# Patient Record
Sex: Male | Born: 1955 | Race: White | Hispanic: No | Marital: Married | Smoking: Never smoker
Health system: Southern US, Community
[De-identification: ages and names within clinical notes are randomized; demographics above are authoritative.]

## PROBLEM LIST (undated history)

## (undated) DIAGNOSIS — H9319 Tinnitus, unspecified ear: Secondary | ICD-10-CM

## (undated) DIAGNOSIS — Z9889 Other specified postprocedural states: Secondary | ICD-10-CM

## (undated) DIAGNOSIS — Z952 Presence of prosthetic heart valve: Secondary | ICD-10-CM

## (undated) DIAGNOSIS — K6289 Other specified diseases of anus and rectum: Secondary | ICD-10-CM

## (undated) DIAGNOSIS — Z7901 Long term (current) use of anticoagulants: Secondary | ICD-10-CM

## (undated) DIAGNOSIS — I38 Endocarditis, valve unspecified: Secondary | ICD-10-CM

## (undated) DIAGNOSIS — R519 Headache, unspecified: Secondary | ICD-10-CM

## (undated) DIAGNOSIS — M7120 Synovial cyst of popliteal space [Baker], unspecified knee: Secondary | ICD-10-CM

## (undated) DIAGNOSIS — Z8379 Family history of other diseases of the digestive system: Secondary | ICD-10-CM

## (undated) DIAGNOSIS — R3912 Poor urinary stream: Secondary | ICD-10-CM

## (undated) DIAGNOSIS — K649 Unspecified hemorrhoids: Secondary | ICD-10-CM

## (undated) DIAGNOSIS — M1990 Unspecified osteoarthritis, unspecified site: Secondary | ICD-10-CM

## (undated) DIAGNOSIS — R350 Frequency of micturition: Secondary | ICD-10-CM

## (undated) DIAGNOSIS — R011 Cardiac murmur, unspecified: Secondary | ICD-10-CM

## (undated) DIAGNOSIS — C801 Malignant (primary) neoplasm, unspecified: Secondary | ICD-10-CM

## (undated) DIAGNOSIS — I341 Nonrheumatic mitral (valve) prolapse: Secondary | ICD-10-CM

## (undated) DIAGNOSIS — E785 Hyperlipidemia, unspecified: Secondary | ICD-10-CM

## (undated) DIAGNOSIS — R112 Nausea with vomiting, unspecified: Secondary | ICD-10-CM

## (undated) DIAGNOSIS — T7840XA Allergy, unspecified, initial encounter: Secondary | ICD-10-CM

## (undated) DIAGNOSIS — R51 Headache: Secondary | ICD-10-CM

## (undated) DIAGNOSIS — N3020 Other chronic cystitis without hematuria: Secondary | ICD-10-CM

## (undated) DIAGNOSIS — Z87442 Personal history of urinary calculi: Secondary | ICD-10-CM

## (undated) DIAGNOSIS — D689 Coagulation defect, unspecified: Secondary | ICD-10-CM

## (undated) DIAGNOSIS — J189 Pneumonia, unspecified organism: Secondary | ICD-10-CM

## (undated) DIAGNOSIS — N400 Enlarged prostate without lower urinary tract symptoms: Secondary | ICD-10-CM

## (undated) HISTORY — PX: CARDIAC VALVE REPLACEMENT: SHX585

## (undated) HISTORY — DX: Coagulation defect, unspecified: D68.9

## (undated) HISTORY — PX: MITRAL VALVE REPAIR: SHX2039

## (undated) HISTORY — PX: HERNIA REPAIR: SHX51

## (undated) HISTORY — DX: Nonrheumatic mitral (valve) prolapse: I34.1

## (undated) HISTORY — DX: Unspecified osteoarthritis, unspecified site: M19.90

## (undated) HISTORY — PX: TONSILLECTOMY: SUR1361

## (undated) HISTORY — PX: MITRAL VALVE REPLACEMENT: SHX147

## (undated) HISTORY — DX: Endocarditis, valve unspecified: I38

## (undated) HISTORY — PX: KNEE SURGERY: SHX244

## (undated) HISTORY — DX: Cardiac murmur, unspecified: R01.1

## (undated) HISTORY — DX: Hyperlipidemia, unspecified: E78.5

## (undated) HISTORY — PX: JOINT REPLACEMENT: SHX530

## (undated) HISTORY — DX: Long term (current) use of anticoagulants: Z79.01

## (undated) HISTORY — PX: OTHER SURGICAL HISTORY: SHX169

## (undated) HISTORY — DX: Presence of prosthetic heart valve: Z95.2

## (undated) HISTORY — DX: Allergy, unspecified, initial encounter: T78.40XA

---
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
## 1998-09-01 ENCOUNTER — Ambulatory Visit (HOSPITAL_COMMUNITY): Admission: RE | Admit: 1998-09-01 | Discharge: 1998-09-01 | Payer: Self-pay | Admitting: Cardiovascular Disease

## 1998-10-24 ENCOUNTER — Emergency Department (HOSPITAL_COMMUNITY): Admission: EM | Admit: 1998-10-24 | Discharge: 1998-10-24 | Payer: Self-pay | Admitting: Emergency Medicine

## 1998-10-26 ENCOUNTER — Ambulatory Visit: Admission: RE | Admit: 1998-10-26 | Discharge: 1998-10-26 | Payer: Self-pay | Admitting: Cardiovascular Disease

## 1999-01-03 ENCOUNTER — Ambulatory Visit (HOSPITAL_COMMUNITY): Admission: RE | Admit: 1999-01-03 | Discharge: 1999-01-03 | Payer: Self-pay | Admitting: Cardiovascular Disease

## 1999-01-24 ENCOUNTER — Encounter (HOSPITAL_COMMUNITY): Admission: RE | Admit: 1999-01-24 | Discharge: 1999-04-24 | Payer: Self-pay | Admitting: Cardiovascular Disease

## 2000-05-30 ENCOUNTER — Ambulatory Visit (HOSPITAL_BASED_OUTPATIENT_CLINIC_OR_DEPARTMENT_OTHER): Admission: RE | Admit: 2000-05-30 | Discharge: 2000-05-30 | Payer: Self-pay | Admitting: Internal Medicine

## 2002-12-14 ENCOUNTER — Encounter: Payer: Self-pay | Admitting: Internal Medicine

## 2002-12-14 ENCOUNTER — Encounter: Admission: RE | Admit: 2002-12-14 | Discharge: 2002-12-14 | Payer: Self-pay | Admitting: Internal Medicine

## 2005-09-06 ENCOUNTER — Encounter: Admission: RE | Admit: 2005-09-06 | Discharge: 2005-09-06 | Payer: Self-pay | Admitting: Gastroenterology

## 2005-10-08 ENCOUNTER — Emergency Department (HOSPITAL_COMMUNITY): Admission: EM | Admit: 2005-10-08 | Discharge: 2005-10-08 | Payer: Self-pay | Admitting: Family Medicine

## 2009-02-07 ENCOUNTER — Emergency Department (HOSPITAL_COMMUNITY): Admission: EM | Admit: 2009-02-07 | Discharge: 2009-02-08 | Payer: Self-pay | Admitting: Emergency Medicine

## 2009-12-13 ENCOUNTER — Ambulatory Visit: Payer: Self-pay | Admitting: Cardiovascular Disease

## 2010-05-22 LAB — CBC
HCT: 46.5 % (ref 39.0–52.0)
Hemoglobin: 16.1 g/dL (ref 13.0–17.0)
MCHC: 34.5 g/dL (ref 30.0–36.0)
MCV: 91.3 fL (ref 78.0–100.0)
Platelets: 128 10*3/uL — ABNORMAL LOW (ref 150–400)
RBC: 5.1 MIL/uL (ref 4.22–5.81)
RDW: 13.2 % (ref 11.5–15.5)
WBC: 4.5 10*3/uL (ref 4.0–10.5)

## 2010-05-22 LAB — DIFFERENTIAL
Basophils Absolute: 0 10*3/uL (ref 0.0–0.1)
Basophils Relative: 0 % (ref 0–1)
Eosinophils Absolute: 0.1 10*3/uL (ref 0.0–0.7)
Eosinophils Relative: 3 % (ref 0–5)
Lymphocytes Relative: 26 % (ref 12–46)
Lymphs Abs: 1.2 10*3/uL (ref 0.7–4.0)
Monocytes Absolute: 0.6 10*3/uL (ref 0.1–1.0)
Monocytes Relative: 14 % — ABNORMAL HIGH (ref 3–12)
Neutro Abs: 2.5 10*3/uL (ref 1.7–7.7)
Neutrophils Relative %: 56 % (ref 43–77)

## 2010-05-22 LAB — PROTIME-INR
INR: 2.39 — ABNORMAL HIGH (ref 0.00–1.49)
Prothrombin Time: 25.9 seconds — ABNORMAL HIGH (ref 11.6–15.2)

## 2010-06-23 ENCOUNTER — Encounter: Payer: Self-pay | Admitting: Cardiovascular Disease

## 2010-06-23 DIAGNOSIS — Z7901 Long term (current) use of anticoagulants: Secondary | ICD-10-CM | POA: Insufficient documentation

## 2010-06-23 DIAGNOSIS — Z952 Presence of prosthetic heart valve: Secondary | ICD-10-CM | POA: Insufficient documentation

## 2010-06-23 DIAGNOSIS — E785 Hyperlipidemia, unspecified: Secondary | ICD-10-CM | POA: Insufficient documentation

## 2010-06-23 DIAGNOSIS — I38 Endocarditis, valve unspecified: Secondary | ICD-10-CM | POA: Insufficient documentation

## 2010-06-23 DIAGNOSIS — I341 Nonrheumatic mitral (valve) prolapse: Secondary | ICD-10-CM | POA: Insufficient documentation

## 2010-06-29 ENCOUNTER — Ambulatory Visit: Payer: Self-pay | Admitting: Cardiovascular Disease

## 2010-07-07 NOTE — Op Note (Signed)
[place]  Patient:    Zachary Calderon, Zachary Calderon                        MRN: [id number] Proc. Date: 05/30/00 Adm. Date:  [id number] Attending:  Reesa Chew                           Operative Report  PREOPERATIVE DIAGNOSIS:  Medial meniscus tear, right knee.  POSTOPERATIVE DIAGNOSIS:  Medial and lateral meniscus tear, right knee.  OPERATION: 1. Right knee examination under anesthesia. 2. Arthroscopy. 3. Partial medial and lateral meniscectomy.  SURGEON:  Loreta Ave, M.D.  ASSISTANT:  Arlys John D. Petrarca, P.A.-C.  ANESTHESIA:  Local with IV sedation.  SPECIMENS:  None.  CULTURES:  None.  COMPLICATIONS:  None.  DRESSING:  Soft compressive.  DESCRIPTION OF PROCEDURE:  The patient was brought to the operating room and placed on the operating table in supine position.  After adequate anesthesia had been obtained, right knee examined.  Full motion, good stability, positive medial McMurray.  Tourniquet and leg holder applied, leg prepped and draped in the usual sterile fashion.  Three portals created, one superolateral, one each medial and lateral parapatellar.  Inflow catheter introduced.  Knee extended. Arthroscope introduced.  Knee inspected.  Only some mild grade I changes in all three compartments, nothing marked.  Radial tear in mid portion lateral meniscus.  ______ tapered smoothly.  Extensive complex tearing posterior half medial meniscus which was removed, tapered into remaining meniscus, salvaging the anterior half.  Good patellofemoral tracking.  Cruciate ligaments intact. Entire knee examined.  No significant findings appreciated.  Instruments and fluid removed.  Portals of knee injected with Marcaine.  Portals closed with 4-0 nylon.  Sterile compressive dressing applied.  Anesthesia reversed, brought to the recovery room.  Tolerated surgery well with no complications. DD:  05/30/00 TD:  05/30/00 Job: [id number]

## 2010-08-14 ENCOUNTER — Ambulatory Visit (INDEPENDENT_AMBULATORY_CARE_PROVIDER_SITE_OTHER): Payer: BC Managed Care – PPO | Admitting: Cardiovascular Disease

## 2010-08-14 ENCOUNTER — Encounter: Payer: Self-pay | Admitting: Cardiovascular Disease

## 2010-08-14 VITALS — BP 124/88 | HR 86 | Ht 72.0 in | Wt 190.2 lb

## 2010-08-14 DIAGNOSIS — Z952 Presence of prosthetic heart valve: Secondary | ICD-10-CM

## 2010-08-14 DIAGNOSIS — Z954 Presence of other heart-valve replacement: Secondary | ICD-10-CM

## 2010-08-14 NOTE — Progress Notes (Signed)
Zachary Calderon Date of Birth  [birth year] [facility] / [facility] [address]     [address] [place], [place]  [address] [phone]  Fax  [phone]  History of Present Illness:  No cardiac complaints.  No excesive bleeding.  Cycling 5 times a week on his spin bike  Current Outpatient Prescriptions on File Prior to Visit  Medication Sig Dispense Refill  . atorvastatin (LIPITOR) 40 MG tablet Take 40 mg by mouth daily.        . Cholecalciferol (VITAMIN D) 1000 UNITS capsule Take 1,000 Units by mouth daily.        . Coenzyme Q10 (COQ10 PO) Take by mouth daily.        . fish oil-omega-3 fatty acids 1000 MG capsule Take by mouth daily. 6 TABS DAILY       . Multiple Vitamin (MULTIVITAMIN) tablet Take 1 tablet by mouth daily. OCCASIONALLY       . Naproxen Sodium (ALEVE PO) Take by mouth as needed.        . vitamin E 400 UNIT capsule Take 400 Units by mouth daily.        Marland Kitchen warfarin (COUMADIN) 5 MG tablet Take 5 mg by mouth daily. AS DIRECTED        . DISCONTD: Saw Palmetto, Serenoa repens, (SAW PALMETTO PO) Take by mouth daily.        Marland Kitchen DISCONTD: ZOLMitriptan (ZOMIG PO) Take by mouth as needed.          Allergies  Allergen Reactions  . Cephalosporins   . Gentamicin   . Vancomycin     Past Medical History  Diagnosis Date  . S/P MVR (mitral valve replacement)   . MVP (mitral valve prolapse)   . Endocarditis   . Hyperlipidemia   . Chronic anticoagulation     Past Surgical History  Procedure Date  . Appendectomy     History  Smoking status  . Never Smoker   Smokeless tobacco  . Not on file    History  Alcohol Use No    Family History  Problem Relation Age of Onset  . Hypertension Mother     Reviw of Systems:  Reviewed in the HPI.  All other systems are negative.  Physical Exam: BP 124/88  Pulse 86  Ht 6' (1.829 m)  Wt 190 lb 3.2 oz (86.274 kg)  BMI 25.80 kg/m2 The patient is alert and oriented x 3.  The mood and affect are  normal.   Skin: warm and dry.  Color is normal.    HEENT:   the sclera are nonicteric.  The mucous membranes are moist.  The carotids are 2+ without bruits.  There is no thyromegaly.  There is no JVD.    Lungs: clear.  The chest wall is non tender.    Heart: regular rate with a mechanical S1 and S2.  There are no murmurs, gallops, or rubs. The PMI is not displaced.     Abdomin: good bowel sounds.  There is no guarding or rebound.  There is no hepatosplenomegaly or tenderness.  There are no masses.   Extremities:  no clubbing, cyanosis, or edema.  The legs are without rashes.  The distal pulses are intact.   Neuro:  Cranial nerves II - XII are intact.  Motor and sensory functions are intact.    The gait is normal.  Assessment / Plan:

## 2010-08-14 NOTE — Assessment & Plan Note (Signed)
Zachary Calderon is doing very well from a cardiac standpoint. His INR levels have been therapeutic. We'll continue with his same dose of Coumadin. His last echocardiogram was in 2010. We'll wait another year to get another echocardiogram. He remains asymptomatic. We have written him a standing order to have his INR checked at [facility].

## 2010-08-21 ENCOUNTER — Telehealth: Payer: Self-pay | Admitting: *Deleted

## 2010-08-21 NOTE — Telephone Encounter (Signed)
He is having his labs for pt/inr done elsewhere, he is to get a fax number for me, left msg to call back with info.

## 2010-08-28 ENCOUNTER — Telehealth: Payer: Self-pay | Admitting: *Deleted

## 2010-08-28 NOTE — Telephone Encounter (Signed)
Pt would like name and number for clinical trials(no specific trial) you spoke of on last visit.  Script was faxed to [facility] for inr check, see order scan. P=[phone]

## 2010-08-29 NOTE — Telephone Encounter (Signed)
[employer] is a company that does trials.  dont know the number .  He may want to contact them .

## 2010-08-30 NOTE — Telephone Encounter (Signed)
Pt called w info

## 2010-12-15 ENCOUNTER — Telehealth: Payer: Self-pay | Admitting: *Deleted

## 2010-12-15 ENCOUNTER — Other Ambulatory Visit: Payer: Self-pay | Admitting: *Deleted

## 2010-12-15 DIAGNOSIS — I38 Endocarditis, valve unspecified: Secondary | ICD-10-CM

## 2010-12-15 DIAGNOSIS — Z952 Presence of prosthetic heart valve: Secondary | ICD-10-CM

## 2010-12-15 MED ORDER — WARFARIN SODIUM 5 MG PO TABS: 5.0000 mg | ORAL_TABLET | Freq: Every day | ORAL | Status: DC

## 2010-12-15 NOTE — Telephone Encounter (Signed)
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 Pt to go to [facility] or other social service support but has to be on coumadin and have inr checks on monthly basis. Pt told to have inr in one week either here in office or [facility] where original script was sent.  1) Coumadin was ordered, pt is to protect mechanical valve. 2) Pt Taking 7.5 mg x 5 and 10 mg x 2.  3) Pt will be contacted 11/2 if no inr in office. Alfonso Ramus RN

## 2010-12-15 NOTE — Telephone Encounter (Signed)
Refill for coumadin denied to pt, we dont have any inr's pt was to go to [facility] and make sure we received labs. Pt to call back.

## 2010-12-15 NOTE — Telephone Encounter (Signed)
Talked to Zachary Calderon,  He lost his job in June and has been without insurance.  He takes brand name Coumadin because his INR levels were quite variable on generic warfarin.   We agreed to have his INR levels drawn at [facility] and faxed to [place] but we have not received any INR levels.  When I talked to his he told me that he has not taken his coumadin since Monday.  I reminded him that this was extremely dangerous and that he is at very high risk of a stroke since he has not taken his meds.  Will refill his coumadin. I told him that he needed to have his INR levels checked in 5-7 days - we will be happy to fax an order to the lab of his choice.  See nursing notes

## 2010-12-21 ENCOUNTER — Encounter: Payer: Self-pay | Admitting: Cardiovascular Disease

## 2010-12-21 LAB — PROTIME-INR: INR: 2.3 — AB (ref <=1.1)

## 2010-12-25 NOTE — Progress Notes (Signed)
Forwarded to coumadin clinic, sally putt and tiffany muse

## 2010-12-26 ENCOUNTER — Ambulatory Visit (INDEPENDENT_AMBULATORY_CARE_PROVIDER_SITE_OTHER): Payer: Self-pay | Admitting: Cardiology

## 2010-12-26 DIAGNOSIS — Z952 Presence of prosthetic heart valve: Secondary | ICD-10-CM

## 2010-12-26 DIAGNOSIS — Z7901 Long term (current) use of anticoagulants: Secondary | ICD-10-CM

## 2010-12-26 DIAGNOSIS — R0989 Other specified symptoms and signs involving the circulatory and respiratory systems: Secondary | ICD-10-CM

## 2010-12-26 DIAGNOSIS — I059 Rheumatic mitral valve disease, unspecified: Secondary | ICD-10-CM

## 2011-01-08 ENCOUNTER — Telehealth: Payer: Self-pay | Admitting: Pharmacist

## 2011-01-08 NOTE — Telephone Encounter (Signed)
Called patient to follow-up on PT/INR that was due on 11/15.  Pt stated he was unable to get to the lab last week.  He works in [place] and says the only day he can get his lab work done is on Thursday.  Since this Thursday is a holiday, he is planning on going to have labwork done on 11/29.  Pt is aware this is 2 weeks after the requested date.  Also reinforced statement that we would only be able to follow INR through lab draws is if patient is consistent and is not delinquent.   He says he will try to find a lab in [place].

## 2011-01-08 NOTE — Telephone Encounter (Signed)
I agree. Thank you.

## 2011-01-10 ENCOUNTER — Ambulatory Visit (INDEPENDENT_AMBULATORY_CARE_PROVIDER_SITE_OTHER): Payer: Self-pay | Admitting: Cardiovascular Disease

## 2011-01-10 DIAGNOSIS — I059 Rheumatic mitral valve disease, unspecified: Secondary | ICD-10-CM

## 2011-01-10 DIAGNOSIS — Z952 Presence of prosthetic heart valve: Secondary | ICD-10-CM

## 2011-01-10 DIAGNOSIS — Z7901 Long term (current) use of anticoagulants: Secondary | ICD-10-CM

## 2011-01-10 DIAGNOSIS — R0989 Other specified symptoms and signs involving the circulatory and respiratory systems: Secondary | ICD-10-CM

## 2011-01-10 LAB — PROTIME-INR: INR: 4.3 — AB (ref 0.9–1.1)

## 2011-01-25 ENCOUNTER — Encounter: Payer: Self-pay | Admitting: Cardiovascular Disease

## 2011-01-25 LAB — PROTIME-INR: INR: 5.9 — AB (ref <=1.1)

## 2011-01-26 ENCOUNTER — Ambulatory Visit (INDEPENDENT_AMBULATORY_CARE_PROVIDER_SITE_OTHER): Payer: Self-pay | Admitting: Cardiology

## 2011-01-26 DIAGNOSIS — Z7901 Long term (current) use of anticoagulants: Secondary | ICD-10-CM

## 2011-01-26 DIAGNOSIS — I059 Rheumatic mitral valve disease, unspecified: Secondary | ICD-10-CM

## 2011-01-26 DIAGNOSIS — Z952 Presence of prosthetic heart valve: Secondary | ICD-10-CM

## 2011-01-26 DIAGNOSIS — R0989 Other specified symptoms and signs involving the circulatory and respiratory systems: Secondary | ICD-10-CM

## 2011-01-28 ENCOUNTER — Ambulatory Visit: Payer: Self-pay

## 2011-01-28 DIAGNOSIS — Z23 Encounter for immunization: Secondary | ICD-10-CM

## 2011-01-28 DIAGNOSIS — M25569 Pain in unspecified knee: Secondary | ICD-10-CM

## 2011-02-08 LAB — PROTIME-INR: INR: 2.6 — AB (ref 0.9–1.1)

## 2011-02-21 ENCOUNTER — Ambulatory Visit (INDEPENDENT_AMBULATORY_CARE_PROVIDER_SITE_OTHER): Payer: Self-pay | Admitting: Cardiology

## 2011-02-21 DIAGNOSIS — Z952 Presence of prosthetic heart valve: Secondary | ICD-10-CM

## 2011-02-21 DIAGNOSIS — I059 Rheumatic mitral valve disease, unspecified: Secondary | ICD-10-CM

## 2011-02-21 DIAGNOSIS — Z7901 Long term (current) use of anticoagulants: Secondary | ICD-10-CM

## 2011-02-21 DIAGNOSIS — R0989 Other specified symptoms and signs involving the circulatory and respiratory systems: Secondary | ICD-10-CM

## 2011-03-01 LAB — PROTIME-INR: INR: 3.4 — AB (ref <=1.1)

## 2011-03-03 ENCOUNTER — Encounter: Payer: Self-pay | Admitting: Cardiovascular Disease

## 2011-03-07 ENCOUNTER — Ambulatory Visit (INDEPENDENT_AMBULATORY_CARE_PROVIDER_SITE_OTHER): Payer: Self-pay | Admitting: Internal Medicine

## 2011-03-07 DIAGNOSIS — Z952 Presence of prosthetic heart valve: Secondary | ICD-10-CM

## 2011-03-07 DIAGNOSIS — R0989 Other specified symptoms and signs involving the circulatory and respiratory systems: Secondary | ICD-10-CM

## 2011-03-07 DIAGNOSIS — I059 Rheumatic mitral valve disease, unspecified: Secondary | ICD-10-CM

## 2011-03-07 DIAGNOSIS — Z7901 Long term (current) use of anticoagulants: Secondary | ICD-10-CM

## 2011-04-05 ENCOUNTER — Encounter: Payer: Self-pay | Admitting: Pharmacist

## 2011-04-05 LAB — PROTIME-INR: INR: 3.6 — AB (ref <=1.1)

## 2011-04-06 ENCOUNTER — Ambulatory Visit (INDEPENDENT_AMBULATORY_CARE_PROVIDER_SITE_OTHER): Payer: Self-pay | Admitting: Cardiology

## 2011-04-06 DIAGNOSIS — Z952 Presence of prosthetic heart valve: Secondary | ICD-10-CM

## 2011-04-06 DIAGNOSIS — Z7901 Long term (current) use of anticoagulants: Secondary | ICD-10-CM

## 2011-04-06 DIAGNOSIS — I059 Rheumatic mitral valve disease, unspecified: Secondary | ICD-10-CM

## 2011-04-06 DIAGNOSIS — R0989 Other specified symptoms and signs involving the circulatory and respiratory systems: Secondary | ICD-10-CM

## 2011-05-10 LAB — PROTIME-INR: INR: 4.3 — AB (ref 0.9–1.1)

## 2011-05-11 ENCOUNTER — Ambulatory Visit: Payer: Self-pay | Admitting: Cardiology

## 2011-05-11 DIAGNOSIS — Z952 Presence of prosthetic heart valve: Secondary | ICD-10-CM

## 2011-05-11 DIAGNOSIS — Z7901 Long term (current) use of anticoagulants: Secondary | ICD-10-CM

## 2011-05-11 DIAGNOSIS — I059 Rheumatic mitral valve disease, unspecified: Secondary | ICD-10-CM

## 2011-06-01 ENCOUNTER — Ambulatory Visit (INDEPENDENT_AMBULATORY_CARE_PROVIDER_SITE_OTHER): Payer: BC Managed Care – PPO | Admitting: Internal Medicine

## 2011-06-01 DIAGNOSIS — Z7901 Long term (current) use of anticoagulants: Secondary | ICD-10-CM

## 2011-06-01 DIAGNOSIS — Z954 Presence of other heart-valve replacement: Secondary | ICD-10-CM

## 2011-06-01 DIAGNOSIS — I059 Rheumatic mitral valve disease, unspecified: Secondary | ICD-10-CM

## 2011-06-01 DIAGNOSIS — Z952 Presence of prosthetic heart valve: Secondary | ICD-10-CM

## 2011-07-12 ENCOUNTER — Telehealth: Payer: Self-pay | Admitting: *Deleted

## 2011-07-12 NOTE — Telephone Encounter (Signed)
Left message on answering machine regarding INR due on 07/05/2011, normally done at lab, we have not gotten any results.

## 2011-07-13 ENCOUNTER — Ambulatory Visit: Payer: Self-pay | Admitting: Cardiology

## 2011-07-13 DIAGNOSIS — Z952 Presence of prosthetic heart valve: Secondary | ICD-10-CM

## 2011-07-13 DIAGNOSIS — Z7901 Long term (current) use of anticoagulants: Secondary | ICD-10-CM

## 2011-07-13 DIAGNOSIS — I059 Rheumatic mitral valve disease, unspecified: Secondary | ICD-10-CM

## 2011-08-07 ENCOUNTER — Telehealth: Payer: Self-pay | Admitting: Cardiovascular Disease

## 2011-08-07 NOTE — Telephone Encounter (Signed)
New msg Pt wants to talk to you about switching from coumadin to warfarin. He doesn't have insurance. Please call

## 2011-08-07 NOTE — Telephone Encounter (Signed)
Left msg, med would be interchangable, please call back, coumadin clinic will address.

## 2011-08-07 NOTE — Telephone Encounter (Signed)
Spoke with pt, pt states he does not have insurance and has been taking name brand Coumadin, but the cost is becoming too expensive.  Pt wishes to switch to generic Warfarin.  Advised pt Warfarin is just as effective, however we recommend getting INR checked 2 weeks after switch to check INR.  Sometimes you need a little less or a little more of the generic to be equivalent to name brand Coumadin.  Pt verbalizes understanding.

## 2011-08-07 NOTE — Telephone Encounter (Signed)
F/U on previous call:  Patient calling back to speak with nurse.

## 2011-09-14 ENCOUNTER — Ambulatory Visit: Payer: Self-pay | Admitting: Cardiology

## 2011-09-14 DIAGNOSIS — I059 Rheumatic mitral valve disease, unspecified: Secondary | ICD-10-CM

## 2011-09-14 DIAGNOSIS — Z952 Presence of prosthetic heart valve: Secondary | ICD-10-CM

## 2011-09-14 DIAGNOSIS — Z7901 Long term (current) use of anticoagulants: Secondary | ICD-10-CM

## 2011-10-04 LAB — PROTIME-INR: INR: 1.8 — AB (ref <=1.1)

## 2011-10-05 ENCOUNTER — Ambulatory Visit (INDEPENDENT_AMBULATORY_CARE_PROVIDER_SITE_OTHER): Payer: BC Managed Care – PPO | Admitting: Cardiovascular Disease

## 2011-10-05 DIAGNOSIS — Z952 Presence of prosthetic heart valve: Secondary | ICD-10-CM

## 2011-10-05 DIAGNOSIS — Z954 Presence of other heart-valve replacement: Secondary | ICD-10-CM

## 2011-10-05 DIAGNOSIS — Z7901 Long term (current) use of anticoagulants: Secondary | ICD-10-CM

## 2011-10-05 DIAGNOSIS — I059 Rheumatic mitral valve disease, unspecified: Secondary | ICD-10-CM

## 2011-10-25 LAB — PROTIME-INR: INR: 2.9 — AB (ref 0.9–1.1)

## 2011-10-26 ENCOUNTER — Ambulatory Visit: Payer: Self-pay | Admitting: Cardiovascular Disease

## 2011-10-26 DIAGNOSIS — Z7901 Long term (current) use of anticoagulants: Secondary | ICD-10-CM

## 2011-10-26 DIAGNOSIS — Z952 Presence of prosthetic heart valve: Secondary | ICD-10-CM

## 2011-10-26 DIAGNOSIS — I059 Rheumatic mitral valve disease, unspecified: Secondary | ICD-10-CM

## 2011-11-28 ENCOUNTER — Ambulatory Visit: Payer: Self-pay | Admitting: Physician Assistant

## 2011-11-28 VITALS — BP 120/68 | HR 86 | Temp 97.9°F | Resp 18 | Ht 72.0 in | Wt 189.0 lb

## 2011-11-28 DIAGNOSIS — L980 Pyogenic granuloma: Secondary | ICD-10-CM

## 2011-11-28 DIAGNOSIS — L03019 Cellulitis of unspecified finger: Secondary | ICD-10-CM

## 2011-11-28 DIAGNOSIS — L02519 Cutaneous abscess of unspecified hand: Secondary | ICD-10-CM

## 2011-11-28 MED ORDER — DOXYCYCLINE HYCLATE 100 MG PO CAPS: 100.0000 mg | ORAL_CAPSULE | Freq: Two times a day (BID) | ORAL | Status: DC

## 2011-11-29 NOTE — Progress Notes (Signed)
  Subjective:    Patient ID: Zachary Calderon, male    DOB: 1955/04/29, 56 y.o.   MRN: [id number]  HPI 56 year old male presents with 4-5 day history of erythema and tenderness of distal right 4th digit. Admits that now there is also a "growth" along the top of the nail that is slightly bothersome.  He has not noticed any drainage, fever, chills, nausea or vomiting. He does have a mechanical valve and therefore is proactive about infection.  He does take coumadin daily.      Review of Systems  Constitutional: Negative for fever and chills.  Gastrointestinal: Negative for nausea and vomiting.  Skin: Positive for rash (right distal phalanx).  Neurological: Negative for headaches.  All other systems reviewed and are negative.       Objective:   Physical Exam  Constitutional: He is oriented to person, place, and time. He appears well-developed and well-nourished.  HENT:  Head: Normocephalic and atraumatic.  Right Ear: External ear normal.  Left Ear: External ear normal.  Eyes: Conjunctivae normal are normal.  Neck: Normal range of motion.  Cardiovascular: Normal rate.   Pulmonary/Chest: Effort normal.  Musculoskeletal: Normal range of motion.  Neurological: He is alert and oriented to person, place, and time.  Skin:       Distal right 4th digit has erythema around nail fold with granulomatous tissue at medial nail fold. No warmth, fluctuance, or induration.   Psychiatric: He has a normal mood and affect. His behavior is normal. Judgment and thought content normal.    Procedure:  VCO. Metacarpal block with 2% lidocaine plain.  Cleaned with Betadine. SP. Granulomatous tissue removed with 4 mm curette.  Hemostasis obtained with Drysol.  Cleaned and bandaged. Patient tolerated well.       Assessment & Plan:   1. Cellulitis of finger  doxycycline (VIBRAMYCIN) 100 MG capsule  2. Pyogenic granuloma     Recommend warm soapy soaks 3-4x/day.  Start doxycycline 100 mg bid Patient due to  have INR checked tomorrow. He will call his Cardiologist to let him know he is going to start Doxycycline. Patient understands this may increase his INR.  Follow up if symptoms worsen or fail to improve.

## 2011-11-30 ENCOUNTER — Telehealth: Payer: Self-pay

## 2011-11-30 NOTE — Telephone Encounter (Signed)
He needs to come back in, if not better. Patient states the area has returned on his finger, he is coming in tomorrow, I told him we will fast track him, Lorain Childes

## 2011-11-30 NOTE — Telephone Encounter (Signed)
Pt says that he was here and a pa did a little surgery on his finger, he would like a nurse to call him back he thinks we may have not got it all

## 2011-12-11 LAB — PROTIME-INR: INR: 3.7 — AB (ref 0.9–1.1)

## 2011-12-12 ENCOUNTER — Ambulatory Visit: Payer: Self-pay | Admitting: Cardiovascular Disease

## 2011-12-12 DIAGNOSIS — Z952 Presence of prosthetic heart valve: Secondary | ICD-10-CM

## 2011-12-12 DIAGNOSIS — Z7901 Long term (current) use of anticoagulants: Secondary | ICD-10-CM

## 2011-12-12 DIAGNOSIS — I059 Rheumatic mitral valve disease, unspecified: Secondary | ICD-10-CM

## 2011-12-24 ENCOUNTER — Other Ambulatory Visit: Payer: Self-pay | Admitting: Cardiovascular Disease

## 2011-12-26 LAB — PROTIME-INR: INR: 3 — AB (ref 0.9–1.1)

## 2011-12-27 ENCOUNTER — Ambulatory Visit: Payer: Self-pay | Admitting: Cardiology

## 2011-12-27 ENCOUNTER — Encounter: Payer: Self-pay | Admitting: *Deleted

## 2011-12-27 DIAGNOSIS — Z7901 Long term (current) use of anticoagulants: Secondary | ICD-10-CM

## 2011-12-27 DIAGNOSIS — Z952 Presence of prosthetic heart valve: Secondary | ICD-10-CM

## 2011-12-27 DIAGNOSIS — I059 Rheumatic mitral valve disease, unspecified: Secondary | ICD-10-CM

## 2012-02-07 ENCOUNTER — Ambulatory Visit: Payer: Self-pay | Admitting: Cardiovascular Disease

## 2012-02-07 DIAGNOSIS — I059 Rheumatic mitral valve disease, unspecified: Secondary | ICD-10-CM

## 2012-02-07 DIAGNOSIS — Z952 Presence of prosthetic heart valve: Secondary | ICD-10-CM

## 2012-02-07 DIAGNOSIS — Z7901 Long term (current) use of anticoagulants: Secondary | ICD-10-CM

## 2012-03-05 NOTE — Telephone Encounter (Signed)
This encounter was created in error - please disregard.

## 2012-03-07 ENCOUNTER — Ambulatory Visit: Payer: Self-pay | Admitting: Cardiovascular Disease

## 2012-03-07 DIAGNOSIS — I059 Rheumatic mitral valve disease, unspecified: Secondary | ICD-10-CM

## 2012-03-07 DIAGNOSIS — Z7901 Long term (current) use of anticoagulants: Secondary | ICD-10-CM

## 2012-03-07 DIAGNOSIS — Z952 Presence of prosthetic heart valve: Secondary | ICD-10-CM

## 2012-04-17 ENCOUNTER — Ambulatory Visit: Payer: Self-pay | Admitting: Internal Medicine

## 2012-04-17 DIAGNOSIS — Z952 Presence of prosthetic heart valve: Secondary | ICD-10-CM

## 2012-04-17 DIAGNOSIS — Z7901 Long term (current) use of anticoagulants: Secondary | ICD-10-CM

## 2012-04-17 DIAGNOSIS — I059 Rheumatic mitral valve disease, unspecified: Secondary | ICD-10-CM

## 2012-06-09 LAB — PROTIME-INR: INR: 3.6 — AB (ref 0.9–1.1)

## 2012-06-10 ENCOUNTER — Ambulatory Visit (INDEPENDENT_AMBULATORY_CARE_PROVIDER_SITE_OTHER): Payer: Self-pay | Admitting: Cardiology

## 2012-06-10 DIAGNOSIS — I059 Rheumatic mitral valve disease, unspecified: Secondary | ICD-10-CM

## 2012-06-10 DIAGNOSIS — Z952 Presence of prosthetic heart valve: Secondary | ICD-10-CM

## 2012-06-10 DIAGNOSIS — Z954 Presence of other heart-valve replacement: Secondary | ICD-10-CM

## 2012-06-10 DIAGNOSIS — Z7901 Long term (current) use of anticoagulants: Secondary | ICD-10-CM

## 2012-06-10 NOTE — Patient Instructions (Signed)
Instructed on increase his leafy green vegetable intake

## 2012-07-04 ENCOUNTER — Telehealth: Payer: Self-pay | Admitting: Pharmacist

## 2012-07-04 MED ORDER — WARFARIN SODIUM 5 MG PO TABS: 5.0000 mg | ORAL_TABLET | ORAL | Status: DC

## 2012-07-04 NOTE — Telephone Encounter (Signed)
Patient called requesting refills for his Coumadin. Last checked in April, next check is next week, May 20th. Sent new Rx.

## 2012-07-06 ENCOUNTER — Emergency Department (HOSPITAL_COMMUNITY)
Admission: EM | Admit: 2012-07-06 | Discharge: 2012-07-07 | Disposition: A | Discharge status: Home / Self Care | Payer: Self-pay | Attending: Emergency Medicine | Admitting: Emergency Medicine

## 2012-07-06 ENCOUNTER — Encounter (HOSPITAL_COMMUNITY): Payer: Self-pay | Admitting: Adult Health

## 2012-07-06 DIAGNOSIS — Z7901 Long term (current) use of anticoagulants: Secondary | ICD-10-CM | POA: Insufficient documentation

## 2012-07-06 DIAGNOSIS — S8012XA Contusion of left lower leg, initial encounter: Secondary | ICD-10-CM

## 2012-07-06 DIAGNOSIS — Z954 Presence of other heart-valve replacement: Secondary | ICD-10-CM | POA: Insufficient documentation

## 2012-07-06 DIAGNOSIS — Y929 Unspecified place or not applicable: Secondary | ICD-10-CM | POA: Insufficient documentation

## 2012-07-06 DIAGNOSIS — Z79899 Other long term (current) drug therapy: Secondary | ICD-10-CM | POA: Insufficient documentation

## 2012-07-06 DIAGNOSIS — E785 Hyperlipidemia, unspecified: Secondary | ICD-10-CM | POA: Insufficient documentation

## 2012-07-06 DIAGNOSIS — Y999 Unspecified external cause status: Secondary | ICD-10-CM | POA: Insufficient documentation

## 2012-07-06 DIAGNOSIS — Z881 Allergy status to other antibiotic agents status: Secondary | ICD-10-CM | POA: Insufficient documentation

## 2012-07-06 DIAGNOSIS — X58XXXA Exposure to other specified factors, initial encounter: Secondary | ICD-10-CM | POA: Insufficient documentation

## 2012-07-06 DIAGNOSIS — S7010XA Contusion of unspecified thigh, initial encounter: Secondary | ICD-10-CM | POA: Insufficient documentation

## 2012-07-06 LAB — BASIC METABOLIC PANEL
Calcium: 9.3 mg/dL (ref 8.4–10.5)
GFR calc non Af Amer: >90 mL/min (ref >90)
Glucose, Bld: 101 mg/dL — ABNORMAL HIGH (ref 70–99)
Potassium: 4.3 mEq/L (ref 3.5–5.1)
Sodium: 139 mEq/L (ref 135–145)

## 2012-07-06 LAB — CBC WITH DIFFERENTIAL/PLATELET
Basophils Relative: 0 % (ref 0–1)
Eosinophils Absolute: 0.2 10*3/uL (ref 0.0–0.7)
Eosinophils Relative: 2 % (ref 0–5)
Lymphs Abs: 1 10*3/uL (ref 0.7–4.0)
MCH: 30.4 pg (ref 26.0–34.0)
MCHC: 35.4 g/dL (ref 30.0–36.0)
MCV: 85.8 fL (ref 78.0–100.0)
Neutrophils Relative %: 77 % (ref 43–77)
Platelets: 137 10*3/uL — ABNORMAL LOW (ref 150–400)
RDW: 12.7 % (ref 11.5–15.5)

## 2012-07-06 LAB — URINE MICROSCOPIC-ADD ON

## 2012-07-06 LAB — URINALYSIS, ROUTINE W REFLEX MICROSCOPIC
Glucose, UA: NEGATIVE mg/dL
Ketones, ur: 15 mg/dL — AB
Specific Gravity, Urine: 1.033 — ABNORMAL HIGH (ref 1.005–1.030)
pH: 6 (ref 5.0–8.0)

## 2012-07-06 LAB — APTT: aPTT: 62 seconds — ABNORMAL HIGH (ref 24–37)

## 2012-07-06 LAB — PROTIME-INR: Prothrombin Time: 30.7 seconds — ABNORMAL HIGH (ref 11.6–15.2)

## 2012-07-06 NOTE — ED Provider Notes (Signed)
History     CSN: [id number]  Arrival date & time 07/06/12  2115   First MD Initiated Contact with Patient 07/06/12 2149      Chief Complaint  Patient presents with  . Bleeding/Bruising    (Consider location/radiation/quality/duration/timing/severity/associated sxs/prior treatment) HPI Comments: Patient who is currently on Coumadin for Mitral Valve Replacement presents today with bruising and swelling of the left upper thigh.  He denies any recent injury or trauma.  He reports that he began having "tightness" of the area three days ago.  He tried using an Ultrasound and a Neurostimulator at that time.  He currently works as a Land.  He reports that his pain then seemed to improve two days ago.  He has been wearing an ACE wrap around the leg for the past couple of days.  This morning he noticed bruising and swelling of the left upper thigh, which is gradually worsening.  Bruising began in the left groin area and has progressed distally down his thigh.  He reports that he has never had anything like this happen before.    The history is provided by the patient.    Past Medical History  Diagnosis Date  . S/P MVR (mitral valve replacement)   . MVP (mitral valve prolapse)   . Endocarditis   . Hyperlipidemia   . Chronic anticoagulation     Past Surgical History  Procedure Laterality Date  . Appendectomy      Family History  Problem Relation Age of Onset  . Hypertension Mother     History  Substance Use Topics  . Smoking status: Never Smoker   . Smokeless tobacco: Not on file  . Alcohol Use: No      Review of Systems  Hematological: Bruises/bleeds easily.  All other systems reviewed and are negative.    Allergies  Cephalosporins  Home Medications   Current Outpatient Rx  Name  Route  Sig  Dispense  Refill  . acetaminophen (TYLENOL) 500 MG tablet   Oral   Take 500 mg by mouth every 6 (six) hours as needed for pain.         . Cholecalciferol (VITAMIN  D) 1000 UNITS capsule   Oral   Take 1,000 Units by mouth daily.          . finasteride (PROSCAR) 5 MG tablet   Oral   Take 5 mg by mouth daily.         . naproxen sodium (ANAPROX) 220 MG tablet   Oral   Take 440 mg by mouth 2 (two) times daily as needed (for pain).         . Tamsulosin HCl (FLOMAX) 0.4 MG CAPS   Oral   Take 0.4 mg by mouth daily.         Marland Kitchen warfarin (COUMADIN) 5 MG tablet   Oral   Take 7.5-10 mg by mouth daily. Takes 7.5mg  every day, except takes 10mg  on Tuesday and friday           BP 139/82  Pulse 91  Temp(Src) 98.2 F (36.8 C) (Oral)  SpO2 100%  Physical Exam  Nursing note and vitals reviewed. Constitutional: He appears well-developed and well-nourished.  Neck: Normal range of motion. Neck supple.  Cardiovascular: Normal rate, regular rhythm and normal heart sounds.   Pulses:      Dorsalis pedis pulses are 2+ on the right side, and 2+ on the left side.  Pulmonary/Chest: Effort normal and breath sounds normal.  Musculoskeletal:  Full  ROM of left hip and left knee.  Neurological: He is alert. No sensory deficit.  Skin: Skin is warm and dry.  Extensive ecchymosis and mild swelling of the left upper thigh extending from the groin area to the mid thigh  Psychiatric: He has a normal mood and affect.    ED Course  Procedures (including critical care time)  Labs Reviewed  APTT - Abnormal; Notable for the following:    aPTT 62 (*)    All other components within normal limits  PROTIME-INR - Abnormal; Notable for the following:    Prothrombin Time 30.7 (*)    INR 3.16 (*)    All other components within normal limits  CBC WITH DIFFERENTIAL - Abnormal; Notable for the following:    RBC 4.08 (*)    Hemoglobin 12.4 (*)    HCT 35.0 (*)    Platelets 137 (*)    All other components within normal limits  BASIC METABOLIC PANEL  URINALYSIS, ROUTINE W REFLEX MICROSCOPIC   No results found.   No diagnosis found.    MDM  Patient who is  currently on coumadin due to valve replacement presents today with ecchymosis of his left upper thigh.  INR today 3.16.  Patient set up to have a Doppler Ultrasound tomorrow morning.  He has also been instructed to follow up with [facility] regarding his Coumadin.        Pascal Lux [place], PA-C 07/07/12 1334  Pascal Lux [place], PA-C 07/08/12 [phone]

## 2012-07-06 NOTE — ED Notes (Signed)
Pt states he has driven to [facility] twice since Thursday which is about 6 hours of driving. Noticed bruising to inner thigh today. States no history of trauma or known injury. Pt takes Coumadin for Valve replacement.

## 2012-07-06 NOTE — ED Notes (Signed)
Presents with left thigh bruising and swelling that began Thursday morning after getting out of a car. He staes, "I felt tight in the left hip and then as the day went on it got worse. I iced it and it got a little better, the next morning it was better, I wrapped it and worked, it began to bother me again the next day. This am I noticed bruising above and below the ace bandage, this evening I noticed it began to get owrse and it continuinng to worsen" pt takes 7.5 mg of coumadin 5 days and 10 mg of coumadin 2 days per week.

## 2012-07-06 NOTE — ED Notes (Signed)
Pt placed in a gown and vitals taken. Pt also placed on 12 lead with cardic monitor due to cardic hx.

## 2012-07-07 ENCOUNTER — Ambulatory Visit (HOSPITAL_COMMUNITY)
Admission: RE | Admit: 2012-07-07 | Payer: Self-pay | Source: Ambulatory Visit | Attending: Allergy and Immunology | Admitting: Allergy and Immunology

## 2012-07-07 NOTE — ED Notes (Signed)
Patient is resting comfortably. Dr. Ignacia Palma at bedside speaking with patient

## 2012-07-07 NOTE — Progress Notes (Signed)
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  He has noted a pain in the left groin onset 3 days ago, with gradual increase in swelling each day, and today noted eccymosis over Hunter's canal.  INR is only 3.1.  Advised venous ultrasound of left left in the morning, which may show a hematoma, and recheck with [facility] after that is done.  Advised to hold coumadin until they advise him.

## 2012-07-08 ENCOUNTER — Telehealth: Payer: Self-pay | Admitting: Cardiovascular Disease

## 2012-07-08 NOTE — Telephone Encounter (Signed)
Pt has had an ED visit due to groin hematoma/ denies any injury but now has a bruise from groin to knee that is tender to touch but changing colors/ fading. Pt was to have venous doppler done but declines due to lack of insurance.  Pt saw Dr Roberts/pcp today and he advised that he reduce his coumadin. Pt has MVR range of 2.5-3.5 4/22 INR 3.6 called in from another office to our clinic to dose. 5/18 INR 3.16 from ED Coumadin clinic declines to reduce coumadin unless Dr Elease Hashimoto advises to, I will forward msg.

## 2012-07-08 NOTE — Telephone Encounter (Signed)
New problem   Pt has experience a bruise lt groin to lt knee. Pt want to speak to nurse.Please call pt concerning this matter.

## 2012-07-08 NOTE — ED Provider Notes (Signed)
Medical screening examination/treatment/procedure(s) were conducted as a shared visit with non-physician practitioner(s) and myself.  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 He has noted a pain in the left groin onset 3 days ago, with gradual increase in swelling each day, and today noted eccymosis over Hunter's canal. INR is only 3.1. Advised venous ultrasound of left left in the morning, which may show a hematoma, and recheck with [facility] after that is done. Advised to hold coumadin until they advise him.       Carleene Cooper III, MD 07/08/12 [phone]

## 2012-07-09 NOTE — Telephone Encounter (Signed)
No answer, will try later

## 2012-07-10 NOTE — Telephone Encounter (Signed)
Follow up    C/O still bleeding. Dark purple bruise into the groin leading toward the scrotum area.

## 2012-07-10 NOTE — Telephone Encounter (Signed)
Spoke with Zachary Calderon informing him that Dr Elease Hashimoto could  not make recommendations on his groin bruising in reference to coumadin adjustments.  Zachary Calderon has not been seen in 1.5 years.  Zachary Calderon went and saw ED and family practice Dr and they have made recommendations.  Zachary Calderon wants Dr Elease Hashimoto to lower his coumadin due to this bruise that appeared without injury.  Zachary Calderon has MVR and INR is to be 2.5 - 3.5 and his readings are in range.  Zachary Calderon has never been seen in our coumadin clinic, Zachary Calderon gets venipuncture elsewhere and calls in results and gets dose by phone.  Dr Elease Hashimoto prefers that the Zachary Calderon either have our coumadin clinic see the patient on a scheduled basis or have his pcp order and regulate his coumadin. Zachary Calderon also needs yearly OV.

## 2012-07-10 NOTE — Telephone Encounter (Signed)
MSG LEFT TO CALL BACK

## 2012-07-10 NOTE — Telephone Encounter (Signed)
Called pt and asked him to call back.

## 2012-07-10 NOTE — Telephone Encounter (Signed)
NO ANSWER,

## 2012-07-16 ENCOUNTER — Ambulatory Visit: Payer: Self-pay | Admitting: Nurse Practitioner

## 2012-07-17 NOTE — Telephone Encounter (Signed)
Pt was told he needs yearly cardiology appointments. Pt was told he needs to either have our coumadin clinic manage his warfarin with visits or he needs to have his pcp take his care with regulating and refilling med.  Pt has two more refills of 60 tablets at pharmacy. Pt is aware he needs to call us back and let us know which he prefers, he was told we will not refill further till he lets [place] know his decision.  Pt was told this is not the appropriate way to manage this type of medication and Dr Elease Hashimoto does not want to continue without redefining the ways we need to manage the warfarin or he can have his PCP manage. Coumadin clinic is aware.  Pt states he will call pcp to see if he will manage and will call us back. Pt said he does not have insurance and an OV costs over 200.  But will be able to make one soon.

## 2012-08-17 ENCOUNTER — Other Ambulatory Visit: Payer: Self-pay | Admitting: Family Medicine

## 2012-08-17 ENCOUNTER — Emergency Department (HOSPITAL_COMMUNITY): Admission: EM | Admit: 2012-08-17 | Discharge: 2012-08-17 | Discharge status: Against Medical Advice | Payer: Self-pay

## 2012-08-17 ENCOUNTER — Ambulatory Visit: Payer: Self-pay | Admitting: Family Medicine

## 2012-08-17 VITALS — BP 122/77 | HR 109 | Temp 98.6°F | Resp 16 | Ht 72.0 in | Wt 186.6 lb

## 2012-08-17 DIAGNOSIS — M79605 Pain in left leg: Secondary | ICD-10-CM

## 2012-08-17 DIAGNOSIS — M79609 Pain in unspecified limb: Secondary | ICD-10-CM

## 2012-08-17 DIAGNOSIS — Z7901 Long term (current) use of anticoagulants: Secondary | ICD-10-CM

## 2012-08-17 DIAGNOSIS — Z954 Presence of other heart-valve replacement: Secondary | ICD-10-CM

## 2012-08-17 DIAGNOSIS — M7989 Other specified soft tissue disorders: Secondary | ICD-10-CM

## 2012-08-17 DIAGNOSIS — Z952 Presence of prosthetic heart valve: Secondary | ICD-10-CM

## 2012-08-17 LAB — PROTIME-INR: INR: 5.21 (ref <1.50)

## 2012-08-17 NOTE — Patient Instructions (Addendum)
Go to [facility] ER for evaluation tonight - specifically for INR, and to determine timing of MRI of thigh.

## 2012-08-17 NOTE — Progress Notes (Signed)
Subjective:    Patient ID: Zachary Calderon, male    DOB: August 20, 1955, 57 y.o.   MRN: [id number]  HPI Zachary Calderon is a 57 y.o. male  On chronic anticoagulation with Coumadin for mitral valve replacement - mechanical, 8/00.   Tightness in L anterior hip middle of May.  NKI.  Noticed tightness getting out of car. 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 More swelling into thigh in 4 days - seen in ER, INR 3.1, black and blue thigh, diagnosed with atraumatic hematoma. Saw PCP few days later Burton Apley).  Pain shifted to posterolateral leg gradually about 3 weeks ago.  Swelling had improved, bu thad knot in gluteal area.  Still had some swelling in ankle/foot that had been settling down from thigh - but this resolved.   Has been doing some pool based exercise, myofascial isometric stretching 4 days ago, with slight soreness afterwards that quickly resolved. Yesterday tried recumbent bike to loosen up the L hip - easy resistance for 13 minutes, foam roller afterwards to thigh and gluteal muscle.  NKI, no pain or swelling last night.  5:15 this am- knot in center of hamstring, then progressive swelling this am. Progressive swelling behind L thigh, increase in hardened area behind thigh today. Driving this afternoon - numbness in bottom of foot with sitting - better now, but slight numbness if sitting or pressure in thigh. No weakness in leg. Increasing pain behind thigh to midday. No weakness. No cp/dyspnea. Last INR 3.1 in May (goal 2.5-3.5). INR in 2.9-3.2 range recently, no recent change in dosing.   Planning on going to [place] in am to work.   Did apply ace bandage to area few times today.    Review of Systems  Constitutional: Negative for fever and chills.  Respiratory: Negative for chest tightness and shortness of breath.   Gastrointestinal: Negative for abdominal pain.  Genitourinary: Negative for difficulty urinating.  Musculoskeletal:  Positive for myalgias and arthralgias.  Skin: Positive for color change.       Objective:   Physical Exam  Constitutional: He appears well-developed and well-nourished. No distress.  Pulmonary/Chest: Effort normal.  Musculoskeletal:       Left knee: He exhibits normal range of motion and no swelling. No tenderness found.       Right upper leg: Normal. He exhibits no tenderness and no swelling.       Left upper leg: He exhibits tenderness, swelling and edema. He exhibits no deformity and no laceration.       Right lower leg: He exhibits no tenderness, no bony tenderness, no swelling and no edema.       Left lower leg: He exhibits no tenderness, no bony tenderness, no swelling and no edema.       Legs:      Left foot: He exhibits normal capillary refill (cap refill less than 1 second. dp pulse easily palpable. nvi distally. ).  Skin: Skin is warm and dry.         Thigh circumference at 15cm proximal to upper patellar pole: R 46cm L 54cm.     Assessment & Plan:  [name] is a 57 y.o. male Long term (current) use of anticoagulants  Mitral valve replaced  Left leg swelling  Leg pain, left  L thigh pain, swelling with quick onset of swelling, tightness and suspected rapidly enlarging hematoma with underlying anticoagulant use.  Intermittent distal parasthesias, but no sign of compartment syndrome at this  point. Similar sx's in anterior L leg last month with improvement of symptoms on own and INR in therapeutic range at that time. Current symptoms more rapidly progressive. Paged ortho on call to discuss treatment.  Will have evaluated in [facility] for probable INR and imaging.       Patient Instructions  Go to [facility] for evaluation tonight - specifically for INR, and to determine timing of MRI of thigh.

## 2015-02-20 DIAGNOSIS — J189 Pneumonia, unspecified organism: Secondary | ICD-10-CM

## 2015-02-20 HISTORY — DX: Pneumonia, unspecified organism: J18.9

## 2015-06-21 LAB — POCT INR: INR: 5.2

## 2015-06-24 ENCOUNTER — Encounter: Payer: Self-pay | Admitting: Sports Medicine

## 2015-06-24 ENCOUNTER — Ambulatory Visit (INDEPENDENT_AMBULATORY_CARE_PROVIDER_SITE_OTHER): Payer: BLUE CROSS/BLUE SHIELD | Admitting: Sports Medicine

## 2015-06-24 VITALS — BP 130/87 | HR 81 | Resp 18 | Ht 71.0 in | Wt 185.1 lb

## 2015-06-24 DIAGNOSIS — Z0000 Encounter for general adult medical examination without abnormal findings: Secondary | ICD-10-CM | POA: Insufficient documentation

## 2015-06-24 DIAGNOSIS — E785 Hyperlipidemia, unspecified: Secondary | ICD-10-CM

## 2015-06-24 DIAGNOSIS — Z299 Encounter for prophylactic measures, unspecified: Secondary | ICD-10-CM

## 2015-06-24 DIAGNOSIS — N400 Enlarged prostate without lower urinary tract symptoms: Secondary | ICD-10-CM | POA: Diagnosis not present

## 2015-06-24 DIAGNOSIS — Z7901 Long term (current) use of anticoagulants: Secondary | ICD-10-CM | POA: Diagnosis not present

## 2015-06-24 DIAGNOSIS — M1711 Unilateral primary osteoarthritis, right knee: Secondary | ICD-10-CM

## 2015-06-24 MED ORDER — TAMSULOSIN HCL 0.4 MG PO CAPS: 0.8000 mg | ORAL_CAPSULE | Freq: Every day | ORAL | Status: DC

## 2015-06-24 NOTE — Assessment & Plan Note (Signed)
Rechecking lipids. 

## 2015-06-24 NOTE — Assessment & Plan Note (Signed)
End stage, lateral subluxation of the tibia. Injection as above, keep appointment with orthopedic surgery.

## 2015-06-24 NOTE — Assessment & Plan Note (Addendum)
INR goal 2.5-3.5. Checking INR today. Takes 8 mg of warfarin daily.

## 2015-06-24 NOTE — Assessment & Plan Note (Signed)
Checking routine blood work, HIV and hepatitis C screening.

## 2015-06-24 NOTE — Assessment & Plan Note (Signed)
Increasing Flomax 0.8 mg

## 2015-06-24 NOTE — Progress Notes (Signed)
  Subjective:    CC: Establish care.   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  Right knee osteoarthritis: End-stage, bone-on-bone, has not had an injection in years, does have an appointment with Dr. Maureen Ralphs for consideration of arthroplasty. Pain is moderate, persistent, localized medial joint line without radiation.  Hyperlipidemia: Due for a lipid recheck.  BPH: Relatively stable on 0.4 mg of Flomax, still has some obstructive symptoms, agreeable to increase the dose.  Past medical history, Surgical history, Family history not pertinant except as noted below, Social history, Allergies, and medications have been entered into the medical record, reviewed, and no changes needed.   Review of Systems: No headache, visual changes, nausea, vomiting, diarrhea, constipation, dizziness, abdominal pain, skin rash, fevers, chills, night sweats, swollen lymph nodes, weight loss, chest pain, body aches, joint swelling, muscle aches, shortness of breath, mood changes, visual or auditory hallucinations.  Objective:    General: Well Developed, well nourished, and in no acute distress.  Neuro: Alert and oriented x3, extra-ocular muscles intact, sensation grossly intact.  HEENT: Normocephalic, atraumatic, pupils equal round reactive to light, neck supple, no masses, no lymphadenopathy, thyroid nonpalpable.  Skin: Warm and dry, no rashes noted.  Cardiac: Regular rate and rhythm, no murmurs rubs or gallops.  Respiratory: Clear to auscultation bilaterally. Not using accessory muscles, speaking in full sentences.  Abdominal: Soft, nontender, nondistended, positive bowel sounds, no masses, no organomegaly.  Right Knee: Varus deformity, tender at the medial joint line ROM normal in flexion and  extension and lower leg rotation. Ligaments with solid consistent endpoints including ACL, PCL, LCL, MCL. Negative Mcmurray's and provocative meniscal tests. Non painful patellar compression. Patellar and quadriceps tendons unremarkable. Hamstring and quadriceps strength is normal.  Procedure: Real-time Ultrasound Guided Injection of right knee Device: GE Logiq E  Verbal informed consent obtained.  Time-out conducted.  Noted no overlying erythema, induration, or other signs of local infection.  Skin prepped in a sterile fashion.  Local anesthesia: Topical Ethyl chloride.  With sterile technique and under real time ultrasound guidance:  1 mL kenalog 40, 2 mL lidocaine, 2 mL Marcaine injected easily Completed without difficulty  Pain immediately resolved suggesting accurate placement of the medication.  Advised to call if fevers/chills, erythema, induration, drainage, or persistent bleeding.  Images permanently stored and available for review in the ultrasound unit.  Impression: Technically successful ultrasound guided injection.  Impression and Recommendations:    The patient was counselled, risk factors were discussed, anticipatory guidance given.

## 2015-06-30 LAB — PROTIME-INR
INR: 2.25 — ABNORMAL HIGH (ref <1.50)
Prothrombin Time: 25.2 seconds — ABNORMAL HIGH (ref 11.6–15.2)

## 2015-07-01 LAB — COMPREHENSIVE METABOLIC PANEL
Albumin: 4.3 g/dL (ref 3.6–5.1)
BUN: 18 mg/dL (ref 7–25)
Chloride: 101 mmol/L (ref 98–110)
Potassium: 5.1 mmol/L (ref 3.5–5.3)
Total Protein: 6.5 g/dL (ref 6.1–8.1)

## 2015-07-01 LAB — COMPREHENSIVE METABOLIC PANEL WITH GFR
ALT: 13 U/L (ref 9–46)
AST: 17 U/L (ref 10–35)
Alkaline Phosphatase: 47 U/L (ref 40–115)
CO2: 31 mmol/L (ref 20–31)
Calcium: 9.3 mg/dL (ref 8.6–10.3)
Creat: 1.04 mg/dL (ref 0.70–1.25)
Glucose, Bld: 90 mg/dL (ref 65–99)
Sodium: 139 mmol/L (ref 135–146)
Total Bilirubin: 2 mg/dL — ABNORMAL HIGH (ref 0.2–1.2)

## 2015-07-01 LAB — LIPID PANEL
Cholesterol: 295 mg/dL — ABNORMAL HIGH (ref 125–200)
HDL: 53 mg/dL (ref >=40)
LDL Cholesterol: 220 mg/dL — ABNORMAL HIGH (ref <130)
Total CHOL/HDL Ratio: 5.6 Ratio — ABNORMAL HIGH (ref <=5.0)
Triglycerides: 111 mg/dL (ref <150)
VLDL: 22 mg/dL (ref <30)

## 2015-07-01 LAB — CBC
HCT: 48.2 % (ref 38.5–50.0)
Hemoglobin: 16.3 g/dL (ref 13.2–17.1)
MCH: 30.2 pg (ref 27.0–33.0)
MCHC: 33.8 g/dL (ref 32.0–36.0)
MCV: 89.4 fL (ref 80.0–100.0)
MPV: 10.3 fL (ref 7.5–12.5)
Platelets: 143 10*3/uL (ref 140–400)
RBC: 5.39 MIL/uL (ref 4.20–5.80)
RDW: 14.8 % (ref 11.0–15.0)
WBC: 4.7 K/uL (ref 3.8–10.8)

## 2015-07-01 LAB — HIV ANTIBODY (ROUTINE TESTING W REFLEX): HIV 1&2 Ab, 4th Generation: NONREACTIVE

## 2015-07-01 LAB — PSA, TOTAL AND FREE
PSA, Free Pct: 13 % — ABNORMAL LOW (ref >25)
PSA, Free: 0.19 ng/mL
PSA: 1.51 ng/mL (ref <=4.00)

## 2015-07-01 LAB — HEMOGLOBIN A1C
Hgb A1c MFr Bld: 5.1 % (ref <5.7)
Mean Plasma Glucose: 100 mg/dL

## 2015-07-01 LAB — HEPATITIS C ANTIBODY: HCV Ab: NEGATIVE

## 2015-07-01 LAB — VITAMIN D 25 HYDROXY (VIT D DEFICIENCY, FRACTURES): Vit D, 25-Hydroxy: 54 ng/mL (ref 30–100)

## 2015-07-04 LAB — CARDIO IQ (R) LIPOPROTEIN FRACT.
LDL Large: 5074 nmol/L (ref 4334–10815)
LDL Medium: 664 nmol/L — ABNORMAL HIGH (ref 167–465)
LDL Particle Number: 2172 nmol/L (ref 1016–2185)
LDL Peak Size: 218.7 Angstrom (ref >=218.2)
LDL Small: 267 nmol/L (ref 123–441)

## 2015-07-06 ENCOUNTER — Encounter: Payer: Self-pay | Admitting: Sports Medicine

## 2015-07-07 ENCOUNTER — Other Ambulatory Visit: Payer: Self-pay | Admitting: Sports Medicine

## 2015-07-22 ENCOUNTER — Ambulatory Visit (INDEPENDENT_AMBULATORY_CARE_PROVIDER_SITE_OTHER): Payer: BLUE CROSS/BLUE SHIELD | Admitting: Sports Medicine

## 2015-07-22 ENCOUNTER — Encounter: Payer: Self-pay | Admitting: Sports Medicine

## 2015-07-22 VITALS — BP 138/83 | HR 87 | Resp 18 | Wt 184.0 lb

## 2015-07-22 DIAGNOSIS — M1711 Unilateral primary osteoarthritis, right knee: Secondary | ICD-10-CM

## 2015-07-22 DIAGNOSIS — E785 Hyperlipidemia, unspecified: Secondary | ICD-10-CM | POA: Diagnosis not present

## 2015-07-22 DIAGNOSIS — N400 Enlarged prostate without lower urinary tract symptoms: Secondary | ICD-10-CM

## 2015-07-22 DIAGNOSIS — G43109 Migraine with aura, not intractable, without status migrainosus: Secondary | ICD-10-CM | POA: Diagnosis not present

## 2015-07-22 DIAGNOSIS — Z7901 Long term (current) use of anticoagulants: Secondary | ICD-10-CM

## 2015-07-22 LAB — POCT INR: INR: 3.9

## 2015-07-22 MED ORDER — RIZATRIPTAN BENZOATE 10 MG PO TBDP: 10.0000 mg | ORAL_TABLET | ORAL | Status: DC | PRN

## 2015-07-22 NOTE — Assessment & Plan Note (Signed)
Knee replacement is coming up in September, we will stop his Coumadin one week before surgery and start Lovenox. He will stop Lovenox the day of the surgery, and start postop day 1.

## 2015-07-22 NOTE — Progress Notes (Signed)
  Subjective:    CC: Follow-up  HPI: Anticoagulation: Here to recheck INR  BPH: Improved voiding with increasing to 0.8 mg of Flomax, he only gets a mild headache with this.  Migraines: Classic, with an aura, scotomata, and a throbbing headache. Zomig is moderately effective, rarely gets headaches.  Hyperlipidemia: Desires to work on dietary changes before considering a statin.   Right knee osteoarthritis: Knee replacement is scheduled for September, we will stop his anticoagulation a week prior. He will likely need to go on Lovenox shots.  Past medical history, Surgical history, Family history not pertinant except as noted below, Social history, Allergies, and medications have been entered into the medical record, reviewed, and no changes needed.   Review of Systems: No fevers, chills, night sweats, weight loss, chest pain, or shortness of breath.   Objective:    General: Well Developed, well nourished, and in no acute distress.  Neuro: Alert and oriented x3, extra-ocular muscles intact, sensation grossly intact.  HEENT: Normocephalic, atraumatic, pupils equal round reactive to light, neck supple, no masses, no lymphadenopathy, thyroid nonpalpable.  Skin: Warm and dry, no rashes. Cardiac: Regular rate and rhythm, no murmurs rubs or gallops, no lower extremity edema.  Respiratory: Clear to auscultation bilaterally. Not using accessory muscles, speaking in full sentences.  Impression and Recommendations:

## 2015-07-22 NOTE — Assessment & Plan Note (Signed)
Slightly improved voiding, gets a mild headache which will probably resolve with time. He will make the decision on his own whether or not he continues with 2 tabs daily.

## 2015-07-22 NOTE — Assessment & Plan Note (Signed)
Switching to Maxalt, migraines are rare.

## 2015-07-22 NOTE — Assessment & Plan Note (Signed)
Will work hard with dietary changes first, we can recheck lipids in about 2-3 months.

## 2015-07-22 NOTE — Assessment & Plan Note (Signed)
INR is supratherapeutic, no changes in dose for now, he does want to do dietary modifications for his lipids which would mean increase in his green vegetable intake which will probably bring his INR down.  Return to see me in 2 weeks to recheck INR.

## 2015-07-27 ENCOUNTER — Other Ambulatory Visit: Payer: Self-pay | Admitting: Sports Medicine

## 2015-08-12 ENCOUNTER — Ambulatory Visit: Payer: BLUE CROSS/BLUE SHIELD

## 2015-08-12 ENCOUNTER — Ambulatory Visit (INDEPENDENT_AMBULATORY_CARE_PROVIDER_SITE_OTHER): Payer: BLUE CROSS/BLUE SHIELD | Admitting: Sports Medicine

## 2015-08-12 VITALS — BP 128/73 | HR 79 | Resp 16

## 2015-08-12 DIAGNOSIS — R002 Palpitations: Secondary | ICD-10-CM | POA: Diagnosis not present

## 2015-08-12 DIAGNOSIS — Z952 Presence of prosthetic heart valve: Secondary | ICD-10-CM

## 2015-08-12 DIAGNOSIS — Z954 Presence of other heart-valve replacement: Secondary | ICD-10-CM

## 2015-08-12 LAB — POCT INR: INR: 2.7

## 2015-08-12 NOTE — Progress Notes (Signed)
  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 He notes significant better voiding, and he does not have chest pain, presyncope with this palpitations, denies any orthostatic symptoms. Has not had an echocardiogram in several years.  Past medical history, Surgical history, Family history not pertinant except as noted below, Social history, Allergies, and medications have been entered into the medical record, reviewed, and no changes needed.   Review of Systems: No fevers, chills, night sweats, weight loss, chest pain, or shortness of breath.   Objective:    General: Well Developed, well nourished, and in no acute distress.  Neuro: Alert and oriented x3, extra-ocular muscles intact, sensation grossly intact.  HEENT: Normocephalic, atraumatic, pupils equal round reactive to light, neck supple, no masses, no lymphadenopathy, thyroid nonpalpable.  Skin: Warm and dry, no rashes. Cardiac: Regular rate and rhythm, no murmurs rubs or gallops, no lower extremity edema.  Respiratory: Clear to auscultation bilaterally. Not using accessory muscles, speaking in full sentences.  Twelve-lead EKG shows normal sinus rhythm without any ectopy  Impression and Recommendations:     1. Palpitations: Benign, they don't bother the patient, no further evaluation needed other than echocardiogram updated. 2. Anticoagulation: Artificial heart valve, mechanical, return in 2 weeks as listed in anticoagulation tracking ___________________________________________ Gwen Her. Dianah Field, M.D., ABFM., CAQSM. Primary Care and [place] Instructor of [place] [employer] [facility] [employer]

## 2015-08-16 ENCOUNTER — Encounter (HOSPITAL_COMMUNITY): Payer: Self-pay | Admitting: Radiology

## 2015-08-16 NOTE — Progress Notes (Signed)
Rescheduled echo per [facility] @ Dr. Dellia Beckwith 's office to 08/17/2015 due SOB and MVR. Patient called back an rescheduled his appointment to 7/13

## 2015-08-17 ENCOUNTER — Other Ambulatory Visit (HOSPITAL_COMMUNITY): Payer: BLUE CROSS/BLUE SHIELD

## 2015-08-24 ENCOUNTER — Other Ambulatory Visit: Payer: Self-pay | Admitting: Sports Medicine

## 2015-08-29 ENCOUNTER — Ambulatory Visit (INDEPENDENT_AMBULATORY_CARE_PROVIDER_SITE_OTHER): Payer: BLUE CROSS/BLUE SHIELD | Admitting: Sports Medicine

## 2015-08-29 ENCOUNTER — Other Ambulatory Visit: Payer: Self-pay | Admitting: Sports Medicine

## 2015-08-29 VITALS — BP 135/79 | HR 72

## 2015-08-29 DIAGNOSIS — Z954 Presence of other heart-valve replacement: Secondary | ICD-10-CM | POA: Diagnosis not present

## 2015-08-29 DIAGNOSIS — Z7901 Long term (current) use of anticoagulants: Secondary | ICD-10-CM

## 2015-08-29 DIAGNOSIS — Z952 Presence of prosthetic heart valve: Secondary | ICD-10-CM

## 2015-08-29 LAB — POCT INR: INR: 4.1

## 2015-08-29 NOTE — Progress Notes (Signed)
Pt advised of dosage change and referral status. Verbalized understanding.

## 2015-08-29 NOTE — Progress Notes (Signed)
   Patient has not been taking 8 mg daily as prescribed, he will drop back down to 8 mg daily, and he desires to get the INR checked at [facility] in [place], closer to where he lives.

## 2015-09-01 ENCOUNTER — Encounter: Payer: Self-pay | Admitting: Sports Medicine

## 2015-09-01 ENCOUNTER — Other Ambulatory Visit (HOSPITAL_COMMUNITY): Payer: BLUE CROSS/BLUE SHIELD

## 2015-09-01 ENCOUNTER — Other Ambulatory Visit: Payer: Self-pay

## 2015-09-01 ENCOUNTER — Ambulatory Visit (HOSPITAL_COMMUNITY): Payer: BLUE CROSS/BLUE SHIELD | Attending: Internal Medicine

## 2015-09-01 DIAGNOSIS — R002 Palpitations: Secondary | ICD-10-CM | POA: Insufficient documentation

## 2015-09-01 DIAGNOSIS — E785 Hyperlipidemia, unspecified: Secondary | ICD-10-CM | POA: Insufficient documentation

## 2015-09-01 DIAGNOSIS — I059 Rheumatic mitral valve disease, unspecified: Secondary | ICD-10-CM | POA: Diagnosis present

## 2015-09-01 DIAGNOSIS — I340 Nonrheumatic mitral (valve) insufficiency: Secondary | ICD-10-CM | POA: Insufficient documentation

## 2015-09-01 DIAGNOSIS — Z952 Presence of prosthetic heart valve: Secondary | ICD-10-CM

## 2015-09-01 DIAGNOSIS — Z954 Presence of other heart-valve replacement: Secondary | ICD-10-CM

## 2015-09-01 DIAGNOSIS — Z953 Presence of xenogenic heart valve: Secondary | ICD-10-CM | POA: Diagnosis not present

## 2015-09-01 DIAGNOSIS — I517 Cardiomegaly: Secondary | ICD-10-CM | POA: Diagnosis not present

## 2015-09-01 LAB — ECHOCARDIOGRAM COMPLETE
Area-P 1/2: 2.2 cm2
E decel time: 229 msec
E/e' ratio: 25.47
FS: 45 % — AB (ref 28–44)
IVS/LV PW RATIO, ED: 0.75
LA ID, A-P, ES: 45 mm
LA diam end sys: 45 mm
LA diam index: 2.21 cm/m2
LA vol A4C: 99 ml
LV E/e' medial: 25.47
LV E/e'average: 25.47
LV PW d: 12 mm — AB (ref 0.6–1.1)
LV e' LATERAL: 4.83 cm/s
LVOT MV VTI INDEX: 1.11 cm2/m2
LVOT MV VTI: 2.27
LVOT SV: 77 mL
LVOT VTI: 15.6 cm
LVOT area: 4.91 cm2
LVOT diameter: 25 mm
LVOT peak grad rest: 1 mmHg
LVOT peak vel: 58.7 cm/s
Lateral S' vel: 4.83 cm/s
MV Annulus VTI: 33.8 cm
MV Dec: 229
MV M vel: 94.9
MV Peak grad: 6 mmHg
MV pk A vel: 130 m/s
MV pk E vel: 123 m/s
Mean grad: 4 mmHg
P 1/2 time: 100 ms
RV sys press: 20 mmHg
Reg peak vel: 206 cm/s
TDI e' lateral: 4.83
TDI e' medial: 8.01
TR max vel: 206 cm/s

## 2015-09-02 ENCOUNTER — Encounter: Payer: Self-pay | Admitting: Sports Medicine

## 2015-09-23 ENCOUNTER — Encounter: Payer: Self-pay | Admitting: Sports Medicine

## 2015-09-23 LAB — LIPID PANEL
Cholesterol: 272 mg/dL — ABNORMAL HIGH (ref 125–200)
HDL: 55 mg/dL (ref >=40)
LDL Cholesterol: 200 mg/dL — ABNORMAL HIGH (ref <130)
Total CHOL/HDL Ratio: 4.9 Ratio (ref <=5.0)
Triglycerides: 84 mg/dL (ref <150)
VLDL: 17 mg/dL (ref <30)

## 2015-09-27 LAB — CARDIO IQ(R) ADVANCED LIPID PANEL
Apolipoprotein B: 133 mg/dL — ABNORMAL HIGH (ref 52–109)
Cholesterol, Total: 267 mg/dL — ABNORMAL HIGH (ref 125–200)
Cholesterol/HDL Ratio: 5.1 calc — ABNORMAL HIGH (ref <=5.0)
HDL Cholesterol: 52 mg/dL (ref >=40)
LDL Large: 3787 nmol/L — ABNORMAL LOW (ref 4334–10815)
LDL Medium: 438 nmol/L (ref 167–465)
LDL Particle Number: 1749 nmol/L (ref 1016–2185)
LDL Peak Size: 217.6 Angstrom — ABNORMAL LOW (ref >=218.2)
LDL Small: 415 nmol/L (ref 123–441)
LDL, Calculated: 198 mg/dL — ABNORMAL HIGH
Lipoprotein (a): 122 nmol/L — ABNORMAL HIGH (ref <75)
Non-HDL Cholesterol: 215 mg/dL
Triglycerides: 87 mg/dL

## 2015-09-29 ENCOUNTER — Encounter: Payer: Self-pay | Admitting: Sports Medicine

## 2015-10-18 ENCOUNTER — Encounter: Payer: Self-pay | Admitting: Sports Medicine

## 2015-10-20 ENCOUNTER — Ambulatory Visit: Payer: Self-pay | Admitting: Orthopedic Surgery

## 2015-10-21 ENCOUNTER — Encounter: Payer: Self-pay | Admitting: Sports Medicine

## 2015-10-21 ENCOUNTER — Ambulatory Visit (INDEPENDENT_AMBULATORY_CARE_PROVIDER_SITE_OTHER): Payer: BLUE CROSS/BLUE SHIELD | Admitting: Sports Medicine

## 2015-10-21 VITALS — BP 133/85 | HR 83 | Wt 186.9 lb

## 2015-10-21 DIAGNOSIS — M1711 Unilateral primary osteoarthritis, right knee: Secondary | ICD-10-CM

## 2015-10-21 DIAGNOSIS — E785 Hyperlipidemia, unspecified: Secondary | ICD-10-CM

## 2015-10-21 DIAGNOSIS — Z7901 Long term (current) use of anticoagulants: Secondary | ICD-10-CM

## 2015-10-21 DIAGNOSIS — N400 Enlarged prostate without lower urinary tract symptoms: Secondary | ICD-10-CM

## 2015-10-21 LAB — POCT INR: INR: 3

## 2015-10-21 MED ORDER — DUTASTERIDE 0.5 MG PO CAPS: 0.5000 mg | ORAL_CAPSULE | Freq: Every day | ORAL | 11 refills | Status: DC

## 2015-10-21 MED ORDER — ENOXAPARIN SODIUM 30 MG/0.3ML ~~LOC~~ SOLN: 30.0000 mg | Freq: Two times a day (BID) | SUBCUTANEOUS | 0 refills | Status: DC

## 2015-10-21 NOTE — Assessment & Plan Note (Signed)
Arthroplasty coming up on September 18

## 2015-10-21 NOTE — Progress Notes (Signed)
  Subjective:    CC: Follow-up  HPI:  Anticoagulation: Therapeutic. We did discuss changes in dosing before his arthroplasty  BPH: Much better on Flomax 0.8 mg, agreeable to try Avodart.  Migraines: Doing okay with occasional Maxalt. There is a cervicogenic component.  Past medical history, Surgical history, Family history not pertinant except as noted below, Social history, Allergies, and medications have been entered into the medical record, reviewed, and no changes needed.   Review of Systems: No fevers, chills, night sweats, weight loss, chest pain, or shortness of breath.   Objective:    General: Well Developed, well nourished, and in no acute distress.  Neuro: Alert and oriented x3, extra-ocular muscles intact, sensation grossly intact.  HEENT: Normocephalic, atraumatic, pupils equal round reactive to light, neck supple, no masses, no lymphadenopathy, thyroid nonpalpable.  Skin: Warm and dry, no rashes. Cardiac: Regular rate and rhythm, no murmurs rubs or gallops, no lower extremity edema.  Respiratory: Clear to auscultation bilaterally. Not using accessory muscles, speaking in full sentences.  Impression and Recommendations:    Chronic anticoagulation INR is therapeutic at 3.0, he does have a knee arthroplasty coming up in about 2 weeks, he will discontinue Coumadin 1 week before and start Lovenox. He will then stop Lovenox 1 day before surgery and then restart postop day one and continue until therapeutic.  Hyperlipidemia Recheck in 3 months and then we will consider statin therapy.  Primary osteoarthritis of right knee Arthroplasty coming up on September 18  BPH (benign prostatic hyperplasia) Doing well with Flomax 0.8, adding Avodart.  I spent 25 minutes with this patient, greater than 50% was face-to-face time counseling regarding the above diagnoses

## 2015-10-21 NOTE — Assessment & Plan Note (Signed)
Recheck in 3 months and then we will consider statin therapy.

## 2015-10-21 NOTE — Assessment & Plan Note (Signed)
INR is therapeutic at 3.0, he does have a knee arthroplasty coming up in about 2 weeks, he will discontinue Coumadin 1 week before and start Lovenox. He will then stop Lovenox 1 day before surgery and then restart postop day one and continue until therapeutic.

## 2015-10-21 NOTE — Assessment & Plan Note (Signed)
Doing well with Flomax 0.8, adding Avodart.

## 2015-10-25 ENCOUNTER — Encounter: Payer: Self-pay | Admitting: Sports Medicine

## 2015-10-28 ENCOUNTER — Encounter (HOSPITAL_COMMUNITY): Payer: Self-pay

## 2015-10-28 ENCOUNTER — Encounter (HOSPITAL_COMMUNITY)
Admission: RE | Admit: 2015-10-28 | Discharge: 2015-10-28 | Disposition: A | Discharge status: Home / Self Care | Payer: BLUE CROSS/BLUE SHIELD | Source: Ambulatory Visit | Attending: Orthopedic Surgery | Admitting: Orthopedic Surgery

## 2015-10-28 ENCOUNTER — Encounter: Payer: Self-pay | Admitting: Sports Medicine

## 2015-10-28 DIAGNOSIS — Z01812 Encounter for preprocedural laboratory examination: Secondary | ICD-10-CM | POA: Diagnosis not present

## 2015-10-28 DIAGNOSIS — M1711 Unilateral primary osteoarthritis, right knee: Secondary | ICD-10-CM | POA: Diagnosis not present

## 2015-10-28 DIAGNOSIS — Z01818 Encounter for other preprocedural examination: Secondary | ICD-10-CM | POA: Insufficient documentation

## 2015-10-28 HISTORY — DX: Malignant (primary) neoplasm, unspecified: C80.1

## 2015-10-28 HISTORY — DX: Poor urinary stream: R39.12

## 2015-10-28 HISTORY — DX: Synovial cyst of popliteal space (Baker), unspecified knee: M71.20

## 2015-10-28 HISTORY — DX: Other specified diseases of anus and rectum: K62.89

## 2015-10-28 HISTORY — DX: Tinnitus, unspecified ear: H93.19

## 2015-10-28 HISTORY — DX: Other specified postprocedural states: Z98.890

## 2015-10-28 HISTORY — DX: Benign prostatic hyperplasia without lower urinary tract symptoms: N40.0

## 2015-10-28 HISTORY — DX: Family history of other diseases of the digestive system: Z83.79

## 2015-10-28 HISTORY — DX: Headache: R51

## 2015-10-28 HISTORY — DX: Frequency of micturition: R35.0

## 2015-10-28 HISTORY — DX: Unspecified hemorrhoids: K64.9

## 2015-10-28 HISTORY — DX: Nausea with vomiting, unspecified: R11.2

## 2015-10-28 HISTORY — DX: Headache, unspecified: R51.9

## 2015-10-28 LAB — URINALYSIS, ROUTINE W REFLEX MICROSCOPIC
Bilirubin Urine: NEGATIVE
Glucose, UA: NEGATIVE mg/dL
Ketones, ur: NEGATIVE mg/dL
Leukocytes, UA: NEGATIVE
NITRITE: NEGATIVE
PROTEIN: NEGATIVE mg/dL
SPECIFIC GRAVITY, URINE: 1.015 (ref 1.005–1.030)
pH: 6 (ref 5.0–8.0)

## 2015-10-28 LAB — URINE MICROSCOPIC-ADD ON

## 2015-10-28 LAB — COMPREHENSIVE METABOLIC PANEL
ALBUMIN: 4.7 g/dL (ref 3.5–5.0)
ALK PHOS: 50 U/L (ref 38–126)
ALT: 18 U/L (ref 17–63)
AST: 25 U/L (ref 15–41)
Anion gap: 6 (ref 5–15)
BILIRUBIN TOTAL: 2 mg/dL — AB (ref 0.3–1.2)
BUN: 19 mg/dL (ref 6–20)
CALCIUM: 9.4 mg/dL (ref 8.9–10.3)
CO2: 29 mmol/L (ref 22–32)
CREATININE: 0.96 mg/dL (ref 0.61–1.24)
Chloride: 104 mmol/L (ref 101–111)
GFR calc Af Amer: >60 mL/min (ref >60)
GFR calc non Af Amer: >60 mL/min (ref >60)
GLUCOSE: 89 mg/dL (ref 65–99)
Potassium: 4.3 mmol/L (ref 3.5–5.1)
SODIUM: 139 mmol/L (ref 135–145)
TOTAL PROTEIN: 7.3 g/dL (ref 6.5–8.1)

## 2015-10-28 LAB — CBC
HEMATOCRIT: 45.7 % (ref 39.0–52.0)
HEMOGLOBIN: 16.3 g/dL (ref 13.0–17.0)
MCH: 30.4 pg (ref 26.0–34.0)
MCHC: 35.7 g/dL (ref 30.0–36.0)
MCV: 85.1 fL (ref 78.0–100.0)
Platelets: 137 10*3/uL — ABNORMAL LOW (ref 150–400)
RBC: 5.37 MIL/uL (ref 4.22–5.81)
RDW: 12.5 % (ref 11.5–15.5)
WBC: 5.1 10*3/uL (ref 4.0–10.5)

## 2015-10-28 LAB — APTT: APTT: 40 s — AB (ref 24–36)

## 2015-10-28 LAB — PROTIME-INR
INR: 2.14
Prothrombin Time: 24.3 seconds — ABNORMAL HIGH (ref 11.4–15.2)

## 2015-10-28 LAB — SURGICAL PCR SCREEN
MRSA, PCR: NEGATIVE
STAPHYLOCOCCUS AUREUS: NEGATIVE

## 2015-10-28 LAB — ABO/RH: ABO/RH(D): A POS

## 2015-10-28 NOTE — Progress Notes (Signed)
Clearance note per chart per Dr Dianah Field per chart with instructions in regards to coumadin and lovenox

## 2015-10-28 NOTE — Patient Instructions (Signed)
Zachary Calderon  10/28/2015   Your procedure is scheduled on: Monday November 07, 2015  Report to [facility]  Entrance take [place]  elevators to 3rd floor to  [place] at 7:30 AM.  Call this number if you have problems the morning of surgery [phone]   Remember: ONLY 1 PERSON MAY GO WITH YOU TO SHORT STAY TO GET  READY MORNING OF [name]  Do not eat food or drink liquids :After Midnight.     Take these medicines the morning of surgery with A SIP OF WATER: NONE                               You may not have any metal on your body including hair pins and              piercings  Do not wear jewelry,lotions, powders or colognes, deodorant              Men may shave face and neck.   Do not bring valuables to the hospital. [name].  Contacts, dentures or bridgework may not be worn into surgery.  Leave suitcase in the car. After surgery it may be brought to your room.                Please read over the following fact sheets you were given:MRSA INFORMATION SHEET; INCENTIVE SPIROMETER; BLOOD TRANSFUSION INFORMATION SHEET  _____________________________________________________________________             [name] - Preparing for Surgery Before surgery, you can play an important role.  Because skin is not sterile, your skin needs to be as free of germs as possible.  You can reduce the number of germs on your skin by washing with CHG (chlorahexidine gluconate) soap before surgery.  CHG is an antiseptic cleaner which kills germs and bonds with the skin to continue killing germs even after washing. Please DO NOT use if you have an allergy to CHG or antibacterial soaps.  If your skin becomes reddened/irritated stop using the CHG and inform your nurse when you arrive at Short Stay. Do not shave (including legs and underarms) for at least 48 hours prior to the first CHG shower.  You may shave your  face/neck. Please follow these instructions carefully:  1.  Shower with CHG Soap the night before surgery and the  morning of Surgery.  2.  If you choose to wash your hair, wash your hair first as usual with your  normal  shampoo.  3.  After you shampoo, rinse your hair and body thoroughly to remove the  shampoo.                           4.  Use CHG as you would any other liquid soap.  You can apply chg directly  to the skin and wash                       Gently with a scrungie or clean washcloth.  5.  Apply the CHG Soap to your body ONLY FROM THE NECK DOWN.   Do not use on face/ open  Wound or open sores. Avoid contact with eyes, ears mouth and genitals (private parts).                       Wash face,  Genitals (private parts) with your normal soap.             6.  Wash thoroughly, paying special attention to the area where your surgery  will be performed.  7.  Thoroughly rinse your body with warm water from the neck down.  8.  DO NOT shower/wash with your normal soap after using and rinsing off  the CHG Soap.                9.  Pat yourself dry with a clean towel.            10.  Wear clean pajamas.            11.  Place clean sheets on your bed the night of your first shower and do not  sleep with pets. Day of Surgery : Do not apply any lotions/deodorants the morning of surgery.  Please wear clean clothes to the hospital/surgery center.  FAILURE TO FOLLOW THESE INSTRUCTIONS MAY RESULT IN THE CANCELLATION OF YOUR SURGERY PATIENT SIGNATURE_________________________________  NURSE SIGNATURE__________________________________  ________________________________________________________________________   Zachary Calderon  An incentive spirometer is a tool that can help keep your lungs clear and active. This tool measures how well you are filling your lungs with each breath. Taking long deep breaths may help reverse or decrease the chance of developing breathing  (pulmonary) problems (especially infection) following:  A long period of time when you are unable to move or be active. BEFORE THE PROCEDURE   If the spirometer includes an indicator to show your best effort, your nurse or respiratory therapist will set it to a desired goal.  If possible, sit up straight or lean slightly forward. Try not to slouch.  Hold the incentive spirometer in an upright position. INSTRUCTIONS FOR USE  1. Sit on the edge of your bed if possible, or sit up as far as you can in bed or on a chair. 2. Hold the incentive spirometer in an upright position. 3. Breathe out normally. 4. Place the mouthpiece in your mouth and seal your lips tightly around it. 5. Breathe in slowly and as deeply as possible, raising the piston or the ball toward the top of the column. 6. Hold your breath for 3-5 seconds or for as long as possible. Allow the piston or ball to fall to the bottom of the column. 7. Remove the mouthpiece from your mouth and breathe out normally. 8. Rest for a few seconds and repeat Steps 1 through 7 at least 10 times every 1-2 hours when you are awake. Take your time and take a few normal breaths between deep breaths. 9. The spirometer may include an indicator to show your best effort. Use the indicator as a goal to work toward during each repetition. 10. After each set of 10 deep breaths, practice coughing to be sure your lungs are clear. If you have an incision (the cut made at the time of surgery), support your incision when coughing by placing a pillow or rolled up towels firmly against it. Once you are able to get out of bed, walk around indoors and cough well. You may stop using the incentive spirometer when instructed by your caregiver.  RISKS AND COMPLICATIONS  Take your time so you do not get  dizzy or light-headed.  If you are in pain, you may need to take or ask for pain medication before doing incentive spirometry. It is harder to take a deep breath if you  are having pain. AFTER USE  Rest and breathe slowly and easily.  It can be helpful to keep track of a log of your progress. Your caregiver can provide you with a simple table to help with this. If you are using the spirometer at home, follow these instructions: [place] IF:   You are having difficultly using the spirometer.  You have trouble using the spirometer as often as instructed.  Your pain medication is not giving enough relief while using the spirometer.  You develop fever of 100.5 F (38.1 C) or higher. SEEK IMMEDIATE MEDICAL CARE IF:   You cough up bloody sputum that had not been present before.  You develop fever of 102 F (38.9 C) or greater.  You develop worsening pain at or near the incision site. MAKE SURE YOU:   Understand these instructions.  Will watch your condition.  Will get help right away if you are not doing well or get worse. Document Released: 06/18/2006 Document Revised: 04/30/2011 Document Reviewed: 08/19/2006 [facility] Patient Information 2014 [place], [place].   ________________________________________________________________________

## 2015-10-29 ENCOUNTER — Other Ambulatory Visit: Payer: Self-pay | Admitting: Sports Medicine

## 2015-10-31 NOTE — Progress Notes (Signed)
PT and PTT results in epic per PAT visit 10/28/2015 sent to Dr Wynelle Link and Arlee Muslim PA

## 2015-11-06 ENCOUNTER — Ambulatory Visit: Payer: Self-pay | Admitting: Orthopedic Surgery

## 2015-11-06 NOTE — H&P (Signed)
Lillia Abed DOB: 09-22-1955 Married / Language: [name] / Race: White Male Date of Admission:  11/07/2015 CC:  Right Knee Pain History of Present Illness The patient is a 60 year old male who comes in for a preoperative History and Physical. The patient is scheduled for a right total knee arthroplasty to be performed by Dr. Dione Plover. Aluisio, MD at [facility] on 11/07/2015. The patient was seen in consultation and reported right knee symptoms including: pain without any known injury. The patient describes their pain as dull and aching. Prior to being seen, the patient was previously evaluated by [place] and [facility] (knee arthroscopy 2001 and 2005). Past treatment for this problem has included intra-articular injection of corticosteroids (has tried a round of synvisc with no relief. ; just 3 weeks ago had ultrasound guided injection.). Symptoms are reported to be located in the right knee and include knee pain (mild , but will have swelling from time to time.). Onset of symptoms was with symptoms now occurring intermittently. Symptoms are relieved by non-opioid analgesics. Breckan had problems with his knee for many years now. He has had progressive bowing of the knee, progressive varus deformity. His pain continues to worsen. He does get swelling. He is not having lower extremity weakness or paresthesia with this. He has had cortisone and viscosupplement injections without much benefit. He is at a stage now where the knee is progressively taken over his life and he is wondering about a more permanent solution to his problem. He is now ready to proceed with surgery at this time. They have been treated conservatively in the past for the above stated problem and despite conservative measures, they continue to have progressive pain and severe functional limitations and dysfunction. They have failed non-operative management including home exercise, medications, and injections. It is  felt that they would benefit from undergoing total joint replacement. Risks and benefits of the procedure have been discussed with the patient and they elect to proceed with surgery. There are no active contraindications to surgery such as ongoing infection or rapidly progressive neurological disease.   Problem List/Past Medical Primary osteoarthritis of right knee (M17.11)  Valvular heart disease  Heart murmur  Hypercholesterolemia  Osteoarthritis  Other disease, cancer, significant illness  Prostate Disease  Migraine Headache  Tinnitus  Hemorrhoids  Enlarged prostate  Staphlococcus Infections  Valvular Heart Repair  Allergies Cephalosporins   Family History Cancer  Maternal Grandfather, Mother. Chronic Obstructive Lung Disease  Father. Congestive Heart Failure  Paternal Grandmother. Depression  Sister. Heart Disease  Father, Maternal Grandmother. Osteoarthritis  Mother.  Social History Children  2 Current drinker  07/15/2015: Currently drinks beer, wine and hard liquor 5-7 times per week Current work status  working part time Exercise  Exercises weekly; does other and gym / [employer] Living situation  live with spouse Marital status  married No history of drug/alcohol rehab  Not under pain contract  Number of flights of stairs before winded  greater than 5 Tobacco / smoke exposure  07/15/2015: no Tobacco use  Never smoker. 07/15/2015  Medication History Warfarin Sodium (5MG  Tablet, Oral) Active. Tamsulosin HCl (0.4MG  Capsule, Oral) Active.  Past Surgical History  Appendectomy  Date: 46. Arthroscopy of Knee  right ~ 2001 Tonsillectomy  1960's Valve Replacement  replaced: mitral - 11/1998 St. Jude Mechanical Valve Mitral Vavle Repair  Date: 09/1998.   Review of Systems General Not Present- Chills, Fatigue, Fever, Memory Loss, Night Sweats, Weight Gain and Weight Loss. Skin Not Present-  Eczema, Hives, Itching, Lesions and  Rash. HEENT Present- Headache and Tinnitus. Not Present- Dentures, Double Vision, Hearing Loss and Visual Loss. Respiratory Not Present- Allergies, Chronic Cough, Coughing up blood, Shortness of breath at rest and Shortness of breath with exertion. Cardiovascular Not Present- Chest Pain, Difficulty Breathing Lying Down, Murmur, Palpitations, Racing/skipping heartbeats and Swelling. Gastrointestinal Not Present- Abdominal Pain, Bloody Stool, Constipation, Diarrhea, Difficulty Swallowing, Heartburn, Jaundice, Loss of appetitie, Nausea and Vomiting. Male Genitourinary Present- Urinary frequency and Weak urinary stream. Not Present- Blood in Urine, Discharge, Flank Pain, Incontinence, Painful Urination, Urgency, Urinary Retention and Urinating at Night. Musculoskeletal Present- Joint Pain and Joint Swelling. Not Present- Back Pain, Morning Stiffness, Muscle Pain, Muscle Weakness and Spasms. Neurological Not Present- Blackout spells, Difficulty with balance, Dizziness, Paralysis, Tremor and Weakness. Psychiatric Not Present- Insomnia.  Vitals Weight: 186 lb Height: 72in Body Surface Area: 2.07 m Body Mass Index: 25.23 kg/m  Pulse: 72 (Regular)  BP: 138/78 (Sitting, Right Arm, Standard)   Physical Exam General Mental Status -Alert, cooperative and good historian. General Appearance-pleasant, Not in acute distress. Orientation-Oriented X3. Build & Nutrition-Well nourished and Well developed.  Head and Neck Head-normocephalic, atraumatic . Neck Global Assessment - supple, no bruit auscultated on the right, no bruit auscultated on the left.  Eye Vision-Wears corrective lenses. Pupil - Bilateral-Regular and Round. Motion - Bilateral-EOMI.  Chest and Lung Exam Auscultation Breath sounds - clear at anterior chest wall and clear at posterior chest wall. Adventitious sounds - No Adventitious sounds.  Cardiovascular Auscultation Rhythm - Regular rate and rhythm.  Heart Sounds - S1 WNL and S2 WNL. Murmurs & Other Heart Sounds - Auscultation of the heart reveals - No Murmurs.  Abdomen Palpation/Percussion Tenderness - Abdomen is non-tender to palpation. Rigidity (guarding) - Abdomen is soft. Auscultation Auscultation of the abdomen reveals - Bowel sounds normal.  Male Genitourinary Note: Not done, not pertinent to present illness   Musculoskeletal Note: On exam, he is alert and oriented, in no apparent distress. Evaluation of his left knee, no swelling and no deformity. Range of motion 0 to 135 but no tenderness or instability. Right knee, severe varus deformity. He has got some venous stasis changes in the skin, but no edema. The stasis changes are down lower in the leg. His range of motion is 5 to 120. There is marked crepitus on range of motion with tenderness medial greater than lateral and no instability.  His radiographs show severe end-stage medial and patellofemoral arthritis of this knee.   Assessment & Plan Primary osteoarthritis of right knee (M17.11)  Note:Surgical Plans: Right Total Knee Replacement  Disposition: Home  PCP: Dr. Dianah Field  IV TXA  Anesthesia Issues: Nausea and dizziness for three days with valve repair [facility])  Patient was given instructions on when to stop his Coumandin and also given instructions for Lovenox use preop.  Signed electronically by Ok Edwards, III PA-C

## 2015-11-07 ENCOUNTER — Inpatient Hospital Stay (HOSPITAL_COMMUNITY): Payer: BLUE CROSS/BLUE SHIELD | Admitting: Anesthesiology

## 2015-11-07 ENCOUNTER — Encounter (HOSPITAL_COMMUNITY)
Admission: RE | Disposition: A | Discharge status: Home Health | Payer: Self-pay | Source: Ambulatory Visit | Attending: Orthopedic Surgery

## 2015-11-07 ENCOUNTER — Inpatient Hospital Stay (HOSPITAL_COMMUNITY)
Admission: RE | Admit: 2015-11-07 | Discharge: 2015-11-09 | DRG: 470 | Disposition: A | Discharge status: Home Health | Payer: BLUE CROSS/BLUE SHIELD | Source: Ambulatory Visit | Attending: Orthopedic Surgery | Admitting: Orthopedic Surgery

## 2015-11-07 ENCOUNTER — Encounter (HOSPITAL_COMMUNITY): Payer: Self-pay | Admitting: *Deleted

## 2015-11-07 DIAGNOSIS — E785 Hyperlipidemia, unspecified: Secondary | ICD-10-CM | POA: Diagnosis present

## 2015-11-07 DIAGNOSIS — N401 Enlarged prostate with lower urinary tract symptoms: Secondary | ICD-10-CM | POA: Diagnosis present

## 2015-11-07 DIAGNOSIS — M25561 Pain in right knee: Secondary | ICD-10-CM | POA: Diagnosis present

## 2015-11-07 DIAGNOSIS — R262 Difficulty in walking, not elsewhere classified: Secondary | ICD-10-CM

## 2015-11-07 DIAGNOSIS — R2689 Other abnormalities of gait and mobility: Secondary | ICD-10-CM

## 2015-11-07 DIAGNOSIS — G43909 Migraine, unspecified, not intractable, without status migrainosus: Secondary | ICD-10-CM | POA: Diagnosis present

## 2015-11-07 DIAGNOSIS — M1711 Unilateral primary osteoarthritis, right knee: Secondary | ICD-10-CM | POA: Diagnosis present

## 2015-11-07 DIAGNOSIS — Z952 Presence of prosthetic heart valve: Secondary | ICD-10-CM | POA: Diagnosis not present

## 2015-11-07 DIAGNOSIS — R3912 Poor urinary stream: Secondary | ICD-10-CM | POA: Diagnosis present

## 2015-11-07 DIAGNOSIS — R350 Frequency of micturition: Secondary | ICD-10-CM | POA: Diagnosis present

## 2015-11-07 DIAGNOSIS — Z96651 Presence of right artificial knee joint: Secondary | ICD-10-CM | POA: Diagnosis present

## 2015-11-07 HISTORY — PX: TOTAL KNEE ARTHROPLASTY: SHX125

## 2015-11-07 LAB — TYPE AND SCREEN
ABO/RH(D): A POS
Antibody Screen: NEGATIVE

## 2015-11-07 LAB — PROTIME-INR
INR: 1.03
Prothrombin Time: 13.5 seconds (ref 11.4–15.2)

## 2015-11-07 SURGERY — ARTHROPLASTY, KNEE, TOTAL
Anesthesia: Monitor Anesthesia Care | Site: Knee | Laterality: Right

## 2015-11-07 MED ORDER — TRANEXAMIC ACID 1000 MG/10ML IV SOLN
2000.0000 mg | Freq: Once | INTRAVENOUS | Status: AC
  Administered 2015-11-07: 2000 mg via TOPICAL
  Filled 2015-11-07: qty 20

## 2015-11-07 MED ORDER — PHENOL 1.4 % MT LIQD: 1.0000 | OROMUCOSAL | Status: DC | PRN

## 2015-11-07 MED ORDER — VANCOMYCIN HCL IN DEXTROSE 1-5 GM/200ML-% IV SOLN
1000.0000 mg | INTRAVENOUS | Status: AC
  Administered 2015-11-07: 1000 mg via INTRAVENOUS
  Filled 2015-11-07: qty 200

## 2015-11-07 MED ORDER — PHENYLEPHRINE HCL 10 MG/ML IJ SOLN
INTRAMUSCULAR | Status: AC
  Filled 2015-11-07: qty 1

## 2015-11-07 MED ORDER — TRANEXAMIC ACID 1000 MG/10ML IV SOLN
1000.0000 mg | INTRAVENOUS | Status: DC
  Filled 2015-11-07: qty 10

## 2015-11-07 MED ORDER — SUMATRIPTAN SUCCINATE 100 MG PO TABS
100.0000 mg | ORAL_TABLET | Freq: Every day | ORAL | Status: DC | PRN
  Filled 2015-11-07: qty 1

## 2015-11-07 MED ORDER — TAMSULOSIN HCL 0.4 MG PO CAPS
0.8000 mg | ORAL_CAPSULE | Freq: Every evening | ORAL | Status: DC
  Administered 2015-11-07 – 2015-11-08 (×2): 0.8 mg via ORAL
  Filled 2015-11-07 (×2): qty 2

## 2015-11-07 MED ORDER — CHLORHEXIDINE GLUCONATE 4 % EX LIQD: 60.0000 mL | Freq: Once | CUTANEOUS | Status: DC

## 2015-11-07 MED ORDER — MIDAZOLAM HCL 2 MG/2ML IJ SOLN
INTRAMUSCULAR | Status: AC
  Filled 2015-11-07: qty 2

## 2015-11-07 MED ORDER — PROPOFOL 500 MG/50ML IV EMUL
INTRAVENOUS | Status: DC | PRN
  Administered 2015-11-07: 125 ug/kg/min via INTRAVENOUS

## 2015-11-07 MED ORDER — FLEET ENEMA 7-19 GM/118ML RE ENEM: 1.0000 | ENEMA | Freq: Once | RECTAL | Status: DC | PRN

## 2015-11-07 MED ORDER — SODIUM CHLORIDE 0.9 % IV SOLN
INTRAVENOUS | Status: DC
  Administered 2015-11-07: 17:00:00 via INTRAVENOUS

## 2015-11-07 MED ORDER — ACETAMINOPHEN 650 MG RE SUPP
650.0000 mg | Freq: Four times a day (QID) | RECTAL | Status: DC | PRN
Start: 2015-11-08 — End: 2015-11-09

## 2015-11-07 MED ORDER — MIDAZOLAM HCL 5 MG/5ML IJ SOLN
INTRAMUSCULAR | Status: DC | PRN
  Administered 2015-11-07: 2 mg via INTRAVENOUS

## 2015-11-07 MED ORDER — ONDANSETRON HCL 4 MG/2ML IJ SOLN
INTRAMUSCULAR | Status: AC
  Filled 2015-11-07: qty 2

## 2015-11-07 MED ORDER — ONDANSETRON HCL 4 MG PO TABS: 4.0000 mg | ORAL_TABLET | Freq: Four times a day (QID) | ORAL | Status: DC | PRN

## 2015-11-07 MED ORDER — DEXTROSE 5 % IV SOLN
500.0000 mg | Freq: Four times a day (QID) | INTRAVENOUS | Status: DC | PRN
  Administered 2015-11-07: 500 mg via INTRAVENOUS
  Filled 2015-11-07: qty 5
  Filled 2015-11-07: qty 550

## 2015-11-07 MED ORDER — METHOCARBAMOL 500 MG PO TABS
500.0000 mg | ORAL_TABLET | Freq: Four times a day (QID) | ORAL | Status: DC | PRN
  Administered 2015-11-08 (×3): 500 mg via ORAL
  Filled 2015-11-07 (×3): qty 1

## 2015-11-07 MED ORDER — TRAMADOL HCL 50 MG PO TABS
50.0000 mg | ORAL_TABLET | Freq: Four times a day (QID) | ORAL | Status: DC | PRN
  Administered 2015-11-08 – 2015-11-09 (×4): 100 mg via ORAL
  Filled 2015-11-07 (×4): qty 2

## 2015-11-07 MED ORDER — SODIUM CHLORIDE 0.9 % IJ SOLN
INTRAMUSCULAR | Status: AC
  Filled 2015-11-07: qty 50

## 2015-11-07 MED ORDER — LACTATED RINGERS IV SOLN
INTRAVENOUS | Status: DC
  Administered 2015-11-07 (×3): via INTRAVENOUS

## 2015-11-07 MED ORDER — WARFARIN - PHARMACIST DOSING INPATIENT: Freq: Every day | Status: DC

## 2015-11-07 MED ORDER — DOCUSATE SODIUM 100 MG PO CAPS
100.0000 mg | ORAL_CAPSULE | Freq: Two times a day (BID) | ORAL | Status: DC
  Administered 2015-11-07 – 2015-11-09 (×4): 100 mg via ORAL
  Filled 2015-11-07 (×4): qty 1

## 2015-11-07 MED ORDER — DIPHENHYDRAMINE HCL 12.5 MG/5ML PO ELIX: 12.5000 mg | ORAL_SOLUTION | ORAL | Status: DC | PRN

## 2015-11-07 MED ORDER — FENTANYL CITRATE (PF) 100 MCG/2ML IJ SOLN
INTRAMUSCULAR | Status: AC
  Filled 2015-11-07: qty 2

## 2015-11-07 MED ORDER — ACETAMINOPHEN 10 MG/ML IV SOLN
INTRAVENOUS | Status: AC
  Filled 2015-11-07: qty 100

## 2015-11-07 MED ORDER — ACETAMINOPHEN 325 MG PO TABS: 650.0000 mg | ORAL_TABLET | Freq: Four times a day (QID) | ORAL | Status: DC | PRN

## 2015-11-07 MED ORDER — SODIUM CHLORIDE 0.9 % IJ SOLN
INTRAMUSCULAR | Status: DC | PRN
  Administered 2015-11-07: 30 mL

## 2015-11-07 MED ORDER — FENTANYL CITRATE (PF) 100 MCG/2ML IJ SOLN
INTRAMUSCULAR | Status: DC | PRN
  Administered 2015-11-07: 100 ug via INTRAVENOUS

## 2015-11-07 MED ORDER — ACETAMINOPHEN 500 MG PO TABS
1000.0000 mg | ORAL_TABLET | Freq: Four times a day (QID) | ORAL | Status: AC
  Administered 2015-11-07 – 2015-11-08 (×4): 1000 mg via ORAL
  Filled 2015-11-07 (×3): qty 2

## 2015-11-07 MED ORDER — METOCLOPRAMIDE HCL 5 MG PO TABS: 5.0000 mg | ORAL_TABLET | Freq: Three times a day (TID) | ORAL | Status: DC | PRN

## 2015-11-07 MED ORDER — BUPIVACAINE IN DEXTROSE 0.75-8.25 % IT SOLN
INTRATHECAL | Status: DC | PRN
  Administered 2015-11-07: 2 mL via INTRATHECAL

## 2015-11-07 MED ORDER — BISACODYL 10 MG RE SUPP: 10.0000 mg | Freq: Every day | RECTAL | Status: DC | PRN

## 2015-11-07 MED ORDER — BUPIVACAINE LIPOSOME 1.3 % IJ SUSP
20.0000 mL | Freq: Once | INTRAMUSCULAR | Status: DC
  Filled 2015-11-07 (×2): qty 20

## 2015-11-07 MED ORDER — METOCLOPRAMIDE HCL 5 MG/ML IJ SOLN: 5.0000 mg | Freq: Three times a day (TID) | INTRAMUSCULAR | Status: DC | PRN

## 2015-11-07 MED ORDER — BUPIVACAINE HCL 0.25 % IJ SOLN
INTRAMUSCULAR | Status: DC | PRN
  Administered 2015-11-07: 20 mL

## 2015-11-07 MED ORDER — DUTASTERIDE 0.5 MG PO CAPS
0.5000 mg | ORAL_CAPSULE | Freq: Every day | ORAL | Status: DC
  Administered 2015-11-08 – 2015-11-09 (×2): 0.5 mg via ORAL
  Filled 2015-11-07 (×2): qty 1

## 2015-11-07 MED ORDER — VANCOMYCIN HCL IN DEXTROSE 1-5 GM/200ML-% IV SOLN
1000.0000 mg | Freq: Two times a day (BID) | INTRAVENOUS | Status: AC
  Administered 2015-11-07: 1000 mg via INTRAVENOUS
  Filled 2015-11-07: qty 200

## 2015-11-07 MED ORDER — MENTHOL 3 MG MT LOZG: 1.0000 | LOZENGE | OROMUCOSAL | Status: DC | PRN

## 2015-11-07 MED ORDER — PHENYLEPHRINE HCL 10 MG/ML IJ SOLN
INTRAVENOUS | Status: DC | PRN
  Administered 2015-11-07: 30 ug/min via INTRAVENOUS

## 2015-11-07 MED ORDER — HYDROMORPHONE HCL 1 MG/ML IJ SOLN: 0.2500 mg | INTRAMUSCULAR | Status: DC | PRN

## 2015-11-07 MED ORDER — PROPOFOL 10 MG/ML IV BOLUS
INTRAVENOUS | Status: AC
  Filled 2015-11-07: qty 20

## 2015-11-07 MED ORDER — PHENYLEPHRINE 40 MCG/ML (10ML) SYRINGE FOR IV PUSH (FOR BLOOD PRESSURE SUPPORT)
PREFILLED_SYRINGE | INTRAVENOUS | Status: AC
  Filled 2015-11-07: qty 10

## 2015-11-07 MED ORDER — DEXAMETHASONE SODIUM PHOSPHATE 10 MG/ML IJ SOLN
10.0000 mg | Freq: Once | INTRAMUSCULAR | Status: AC
  Administered 2015-11-07: 10 mg via INTRAVENOUS

## 2015-11-07 MED ORDER — HYDROCODONE-ACETAMINOPHEN 7.5-325 MG PO TABS: 1.0000 | ORAL_TABLET | Freq: Once | ORAL | Status: DC | PRN

## 2015-11-07 MED ORDER — BUPIVACAINE LIPOSOME 1.3 % IJ SUSP
INTRAMUSCULAR | Status: DC | PRN
  Administered 2015-11-07: 20 mL

## 2015-11-07 MED ORDER — OXYCODONE HCL 5 MG PO TABS
5.0000 mg | ORAL_TABLET | ORAL | Status: DC | PRN
  Administered 2015-11-07 (×2): 5 mg via ORAL
  Administered 2015-11-07 – 2015-11-09 (×8): 10 mg via ORAL
  Filled 2015-11-07 (×3): qty 2
  Filled 2015-11-07: qty 1
  Filled 2015-11-07 (×3): qty 2
  Filled 2015-11-07: qty 1
  Filled 2015-11-07 (×2): qty 2

## 2015-11-07 MED ORDER — PROMETHAZINE HCL 25 MG/ML IJ SOLN: 6.2500 mg | INTRAMUSCULAR | Status: DC | PRN

## 2015-11-07 MED ORDER — ENOXAPARIN SODIUM 30 MG/0.3ML ~~LOC~~ SOLN
30.0000 mg | Freq: Two times a day (BID) | SUBCUTANEOUS | Status: DC
  Administered 2015-11-08 – 2015-11-09 (×3): 30 mg via SUBCUTANEOUS
  Filled 2015-11-07 (×3): qty 0.3

## 2015-11-07 MED ORDER — ONDANSETRON HCL 4 MG/2ML IJ SOLN: 4.0000 mg | Freq: Four times a day (QID) | INTRAMUSCULAR | Status: DC | PRN

## 2015-11-07 MED ORDER — RIZATRIPTAN BENZOATE 10 MG PO TBDP
10.0000 mg | ORAL_TABLET | Freq: Every day | ORAL | Status: DC | PRN
  Filled 2015-11-07: qty 1

## 2015-11-07 MED ORDER — BUPIVACAINE HCL (PF) 0.25 % IJ SOLN
INTRAMUSCULAR | Status: AC
  Filled 2015-11-07: qty 30

## 2015-11-07 MED ORDER — DEXAMETHASONE SODIUM PHOSPHATE 10 MG/ML IJ SOLN
INTRAMUSCULAR | Status: AC
  Filled 2015-11-07: qty 1

## 2015-11-07 MED ORDER — SODIUM CHLORIDE 0.9 % IR SOLN
Status: DC | PRN
  Administered 2015-11-07: 1000 mL

## 2015-11-07 MED ORDER — WARFARIN SODIUM 5 MG PO TABS
10.0000 mg | ORAL_TABLET | Freq: Once | ORAL | Status: AC
  Administered 2015-11-07: 10 mg via ORAL
  Filled 2015-11-07: qty 2

## 2015-11-07 MED ORDER — PROPOFOL 10 MG/ML IV BOLUS
INTRAVENOUS | Status: AC
  Filled 2015-11-07: qty 60

## 2015-11-07 MED ORDER — MORPHINE SULFATE (PF) 2 MG/ML IV SOLN
1.0000 mg | INTRAVENOUS | Status: DC | PRN
  Administered 2015-11-07 – 2015-11-08 (×2): 1 mg via INTRAVENOUS
  Filled 2015-11-07 (×2): qty 1

## 2015-11-07 MED ORDER — DEXAMETHASONE SODIUM PHOSPHATE 10 MG/ML IJ SOLN
10.0000 mg | Freq: Once | INTRAMUSCULAR | Status: AC
  Administered 2015-11-08: 10 mg via INTRAVENOUS
  Filled 2015-11-07: qty 1

## 2015-11-07 MED ORDER — ACETAMINOPHEN 10 MG/ML IV SOLN
1000.0000 mg | Freq: Once | INTRAVENOUS | Status: AC
  Administered 2015-11-07: 1000 mg via INTRAVENOUS
  Filled 2015-11-07: qty 100

## 2015-11-07 MED ORDER — PHENYLEPHRINE HCL 10 MG/ML IJ SOLN
INTRAMUSCULAR | Status: DC | PRN
  Administered 2015-11-07: 120 ug via INTRAVENOUS
  Administered 2015-11-07 (×2): 80 ug via INTRAVENOUS

## 2015-11-07 MED ORDER — POLYETHYLENE GLYCOL 3350 17 G PO PACK: 17.0000 g | PACK | Freq: Every day | ORAL | Status: DC | PRN

## 2015-11-07 MED ORDER — ONDANSETRON HCL 4 MG/2ML IJ SOLN
INTRAMUSCULAR | Status: DC | PRN
  Administered 2015-11-07: 4 mg via INTRAVENOUS

## 2015-11-07 SURGICAL SUPPLY — 50 items
BAG DECANTER FOR FLEXI CONT (MISCELLANEOUS) ×2 IMPLANT
BAG ZIPLOCK 12X15 (MISCELLANEOUS) ×2 IMPLANT
BANDAGE ACE 6X5 VEL STRL LF (GAUZE/BANDAGES/DRESSINGS) IMPLANT
BANDAGE ELASTIC 6 VELCRO ST LF (GAUZE/BANDAGES/DRESSINGS) ×2 IMPLANT
BLADE SAG 18X100X1.27 (BLADE) ×2 IMPLANT
BLADE SAW SGTL 11.0X1.19X90.0M (BLADE) ×2 IMPLANT
BOWL SMART MIX CTS (DISPOSABLE) ×2 IMPLANT
CAPT KNEE TOTAL 3 ATTUNE ×2 IMPLANT
CEMENT HV SMART SET (Cement) ×4 IMPLANT
CLOTH BEACON ORANGE TIMEOUT ST (SAFETY) ×2 IMPLANT
CUFF TOURN SGL QUICK 34 (TOURNIQUET CUFF) ×2
CUFF TRNQT CYL 34X4X40X1 (TOURNIQUET CUFF) ×1 IMPLANT
DECANTER SPIKE VIAL GLASS SM (MISCELLANEOUS) ×2 IMPLANT
DRAPE U-SHAPE 47X51 STRL (DRAPES) ×2 IMPLANT
DRSG ADAPTIC 3X8 NADH LF (GAUZE/BANDAGES/DRESSINGS) ×2 IMPLANT
DRSG PAD ABDOMINAL 8X10 ST (GAUZE/BANDAGES/DRESSINGS) IMPLANT
DURAPREP 26ML APPLICATOR (WOUND CARE) ×2 IMPLANT
ELECT REM PT RETURN 9FT ADLT (ELECTROSURGICAL) ×2
ELECTRODE REM PT RTRN 9FT ADLT (ELECTROSURGICAL) ×1 IMPLANT
EVACUATOR 1/8 PVC DRAIN (DRAIN) ×2 IMPLANT
GAUZE SPONGE 4X4 12PLY STRL (GAUZE/BANDAGES/DRESSINGS) ×2 IMPLANT
GLOVE BIO SURGEON STRL SZ7.5 (GLOVE) IMPLANT
GLOVE BIO SURGEON STRL SZ8 (GLOVE) ×2 IMPLANT
GLOVE BIOGEL PI IND STRL 6.5 (GLOVE) IMPLANT
GLOVE BIOGEL PI IND STRL 8 (GLOVE) ×1 IMPLANT
GLOVE BIOGEL PI INDICATOR 6.5 (GLOVE)
GLOVE BIOGEL PI INDICATOR 8 (GLOVE) ×1
GLOVE SURG SS PI 6.5 STRL IVOR (GLOVE) IMPLANT
GOWN STRL REUS W/TWL LRG LVL3 (GOWN DISPOSABLE) ×2 IMPLANT
GOWN STRL REUS W/TWL XL LVL3 (GOWN DISPOSABLE) IMPLANT
HANDPIECE INTERPULSE COAX TIP (DISPOSABLE) ×2
IMMOBILIZER KNEE 20 (SOFTGOODS) ×2
IMMOBILIZER KNEE 20 THIGH 36 (SOFTGOODS) ×1 IMPLANT
MANIFOLD NEPTUNE II (INSTRUMENTS) ×2 IMPLANT
NS IRRIG 1000ML POUR BTL (IV SOLUTION) ×2 IMPLANT
PACK TOTAL KNEE CUSTOM (KITS) ×2 IMPLANT
PAD ABD 8X10 STRL (GAUZE/BANDAGES/DRESSINGS) ×2 IMPLANT
PADDING CAST COTTON 6X4 STRL (CAST SUPPLIES) ×2 IMPLANT
POSITIONER SURGICAL ARM (MISCELLANEOUS) ×2 IMPLANT
SET HNDPC FAN SPRY TIP SCT (DISPOSABLE) ×1 IMPLANT
STRIP CLOSURE SKIN 1/2X4 (GAUZE/BANDAGES/DRESSINGS) ×2 IMPLANT
SUT MNCRL AB 4-0 PS2 18 (SUTURE) ×2 IMPLANT
SUT VIC AB 2-0 CT1 27 (SUTURE) ×6
SUT VIC AB 2-0 CT1 TAPERPNT 27 (SUTURE) ×3 IMPLANT
SUT VLOC 180 0 24IN GS25 (SUTURE) ×2 IMPLANT
SYR 50ML LL SCALE MARK (SYRINGE) ×2 IMPLANT
TRAY FOLEY W/METER SILVER 16FR (SET/KITS/TRAYS/PACK) ×2 IMPLANT
WATER STERILE IRR 1500ML POUR (IV SOLUTION) ×2 IMPLANT
WRAP KNEE MAXI GEL POST OP (GAUZE/BANDAGES/DRESSINGS) ×2 IMPLANT
YANKAUER SUCT BULB TIP 10FT TU (MISCELLANEOUS) ×2 IMPLANT

## 2015-11-07 NOTE — Interval H&P Note (Signed)
History and Physical Interval Note:  11/07/2015 9:19 AM  Zachary Calderon  has presented today for surgery, with the diagnosis of RIGHT KNEE OA  The various methods of treatment have been discussed with the patient and family. After consideration of risks, benefits and other options for treatment, the patient has consented to  Procedure(s): RIGHT TOTAL KNEE ARTHROPLASTY (Right) as a surgical intervention .  The patient's history has been reviewed, patient examined, no change in status, stable for surgery.  I have reviewed the patient's chart and labs.  Questions were answered to the patient's satisfaction.     Gearlean Alf

## 2015-11-07 NOTE — Evaluation (Signed)
Physical Therapy Evaluation Patient Details Name: Zachary Calderon MRN: [id number] DOB: 1955-06-12 Today's Date: 11/07/2015   History of Present Illness  R TKA  Clinical Impression  The patient tolerated ambulating x 30' without  Dizziness and minimal c/o pain. Plans for DC to home. Pt admitted with above diagnosis. Pt currently with functional limitations due to the deficits listed below (see PT Problem List).  Pt will benefit from skilled PT to increase their independence and safety with mobility to allow discharge to the venue listed below.       Follow Up Recommendations Home health PT;Supervision/Assistance - 24 hour    Equipment Recommendations  Rolling walker with 5" wheels    Recommendations for Other Services       Precautions / Restrictions Precautions Precautions: Knee Required Braces or Orthoses: Knee Immobilizer - Right Knee Immobilizer - Right: Discontinue once straight leg raise with < 10 degree lag      Mobility  Bed Mobility Overal bed mobility: Needs Assistance Bed Mobility: Supine to Sit     Supine to sit: Min assist;HOB elevated     General bed mobility comments: assuist with r leg  Transfers Overall transfer level: Needs assistance Equipment used: Rolling walker (2 wheeled) Transfers: Sit to/from Stand Sit to Stand: Min assist         General transfer comment: cues for safety  Ambulation/Gait Ambulation/Gait assistance: Min assist Ambulation Distance (Feet): 30 Feet Assistive device: Rolling walker (2 wheeled) Gait Pattern/deviations: Step-to pattern;Step-through pattern;Decreased stance time - right     General Gait Details: cues for sequence and posture  Stairs            Wheelchair Mobility    Modified Rankin (Stroke Patients Only)       Balance                                             Pertinent Vitals/Pain Pain Assessment: 0-10 Pain Score: 3  Pain Location: R knee Pain Descriptors / Indicators:  Sore;Discomfort Pain Intervention(s): Premedicated before session;Repositioned    Home Living Family/patient expects to be discharged to:: Private residence Living Arrangements: Spouse/significant other Available Help at Discharge: Family Type of Home: House Home Access: Stairs to enter Entrance Stairs-Rails: None Entrance Stairs-Number of Steps: 4 + 8 or 4  Home Layout: Two level;Able to live on main level with bedroom/bathroom Home Equipment: Crutches      Prior Function Level of Independence: Independent               Hand Dominance        Extremity/Trunk Assessment   Upper Extremity Assessment: Defer to OT evaluation           Lower Extremity Assessment: RLE deficits/detail RLE Deficits / Details: able to raise the leg       Communication   Communication: No difficulties  Cognition Arousal/Alertness: Awake/alert Behavior During Therapy: WFL for tasks assessed/performed Overall Cognitive Status: Within Functional Limits for tasks assessed                      General Comments      Exercises     Assessment/Plan    PT Assessment Patient needs continued PT services  PT Problem List Decreased strength;Decreased range of motion;Decreased activity tolerance;Decreased mobility;Decreased knowledge of precautions;Decreased safety awareness;Decreased knowledge of use of DME;Pain  PT Treatment Interventions DME instruction;Gait training;Stair training;Functional mobility training;Therapeutic activities;Therapeutic exercise;Patient/family education    PT Goals (Current goals can be found in the Care Plan section)  Acute Rehab PT Goals Patient Stated Goal: to walk PT Goal Formulation: With patient/family Time For Goal Achievement: 11/10/15 Potential to Achieve Goals: Good    Frequency 7X/week   Barriers to discharge        Co-evaluation               End of Session Equipment Utilized During Treatment: Right knee  immobilizer Activity Tolerance: Patient tolerated treatment well Patient left: in chair;with call bell/phone within reach;with chair alarm set Nurse Communication: Mobility status         Time: [id number] PT Time Calculation (min) (ACUTE ONLY): 22 min   Charges:   PT Evaluation $PT Eval Low Complexity: 1 Procedure     PT G CodesClaretha Cooper 11/07/2015, 6:22 PM Tresa Endo PT [phone]

## 2015-11-07 NOTE — Anesthesia Procedure Notes (Signed)
Spinal  Patient location during procedure: OR End time: 11/07/2015 10:10 AM Staffing Resident/CRNA: Enrigue Catena E Performed: anesthesiologist and resident/CRNA  Preanesthetic Checklist Completed: patient identified, site marked, surgical consent, pre-op evaluation, timeout performed, IV checked, risks and benefits discussed and monitors and equipment checked Spinal Block Patient position: sitting Prep: ChloraPrep Patient monitoring: heart rate, continuous pulse ox and blood pressure Approach: right paramedian Location: L4-5 Injection technique: single-shot Needle Needle type: Pencan  Needle gauge: 25 G Needle length: 9 cm Additional Notes Expiration date of kit checked and confirmed. Patient tolerated procedure well, without complications.

## 2015-11-07 NOTE — Anesthesia Postprocedure Evaluation (Signed)
Anesthesia Post Note  Patient: Zachary Calderon  Procedure(s) Performed: Procedure(s) (LRB): RIGHT TOTAL KNEE ARTHROPLASTY (Right)  Patient location during evaluation: PACU Anesthesia Type: Spinal and MAC Level of consciousness: awake and alert Pain management: pain level controlled Vital Signs Assessment: post-procedure vital signs reviewed and stable Respiratory status: spontaneous breathing and respiratory function stable Cardiovascular status: blood pressure returned to baseline and stable Postop Assessment: spinal receding Anesthetic complications: no    Last Vitals:  Vitals:   11/07/15 1315 11/07/15 1330  BP: 124/81 118/83  Pulse: (!) 56 62  Resp: 12 14  Temp: 36.8 C 36.4 C    Last Pain:  Vitals:   11/07/15 1315  TempSrc:   PainSc: 0-No pain                 Tiajuana Amass

## 2015-11-07 NOTE — Transfer of Care (Signed)
Immediate Anesthesia Transfer of Care Note  Patient: Zachary Calderon  Procedure(s) Performed: Procedure(s): RIGHT TOTAL KNEE ARTHROPLASTY (Right)  Patient Location: PACU  Anesthesia Type:Spinal  Level of Consciousness: awake, alert , oriented and patient cooperative  Airway & Oxygen Therapy: Patient Spontanous Breathing and Patient connected to face mask oxygen  Post-op Assessment: Report given to RN and Post -op Vital signs reviewed and stable  Post vital signs: stable  Last Vitals:  Vitals:   11/07/15 0734 11/07/15 1152  BP: 137/85 (!) 107/59  Pulse: 86 67  Resp: 16 (!) 5  Temp: 36.9 C     Last Pain:  Vitals:   11/07/15 0734  TempSrc: Oral      Patients Stated Pain Goal: 4 ([id number])  Complications: No apparent anesthesia complications  L 1 spinal level

## 2015-11-07 NOTE — Op Note (Signed)
OPERATIVE REPORT-TOTAL KNEE ARTHROPLASTY   Pre-operative diagnosis- Osteoarthritis  Right knee(s)  Post-operative diagnosis- Osteoarthritis Right knee(s)  Procedure-  Right  Total Knee Arthroplasty  Surgeon- Zachary Plover. Tidus Upchurch, MD  Assistant- Arlee Muslim, PA-C   Anesthesia-  Spinal  EBL-* No blood loss amount entered *   Drains Hemovac  Tourniquet time-  Total Tourniquet Time Documented: Thigh (Right) - 37 minutes Total: Thigh (Right) - 37 minutes     Complications- None  Condition-PACU - hemodynamically stable.   Brief Clinical Note  Zachary Calderon is a 60 y.o. year old male with end stage OA of his right knee with progressively worsening pain and dysfunction. He has constant pain, with activity and at rest and significant functional deficits with difficulties even with ADLs. He has had extensive non-op management including analgesics, injections of cortisone, and home exercise program, but remains in significant pain with significant dysfunction. Radiographs show bone on bone arthritis medial and patellofemoral. He presents now for right Total Knee Arthroplasty.    Procedure in detail---   The patient is brought into the operating room and positioned supine on the operating table. After successful administration of  Spinal,   a tourniquet is placed high on the  Right thigh(s) and the lower extremity is prepped and draped in the usual sterile fashion. Time out is performed by the operating team and then the  Right lower extremity is wrapped in Esmarch, knee flexed and the tourniquet inflated to 300 mmHg.       A midline incision is made with a ten blade through the subcutaneous tissue to the level of the extensor mechanism. A fresh blade is used to make a medial parapatellar arthrotomy. Soft tissue over the proximal medial tibia is subperiosteally elevated to the joint line with a knife and into the semimembranosus bursa with a Cobb elevator. Soft tissue over the proximal lateral  tibia is elevated with attention being paid to avoiding the patellar tendon on the tibial tubercle. The patella is everted, knee flexed 90 degrees and the ACL and PCL are removed. Findings are bone on bone medial and patellofemoral with massive global osteophytes.        The drill is used to create a starting hole in the distal femur and the canal is thoroughly irrigated with sterile saline to remove the fatty contents. The 5 degree Right  valgus alignment guide is placed into the femoral canal and the distal femoral cutting block is pinned to remove 9 mm off the distal femur. Resection is made with an oscillating saw.      The tibia is subluxed forward and the menisci are removed. The extramedullary alignment guide is placed referencing proximally at the medial aspect of the tibial tubercle and distally along the second metatarsal axis and tibial crest. The block is pinned to remove 27mm off the more deficient medial  side. Resection is made with an oscillating saw. Size 8is the most appropriate size for the tibia and the proximal tibia is prepared with the modular drill and keel punch for that size.      The femoral sizing guide is placed and size 8 is most appropriate. Rotation is marked off the epicondylar axis and confirmed by creating a rectangular flexion gap at 90 degrees. The size 8 cutting block is pinned in this rotation and the anterior, posterior and chamfer cuts are made with the oscillating saw. The intercondylar block is then placed and that cut is made.      Trial size  8 tibial component, trial size 8 posterior stabilized femur and a 6  mm posterior stabilized rotating platform insert trial is placed. Full extension is achieved with excellent varus/valgus and anterior/posterior balance throughout full range of motion. The patella is everted and thickness measured to be 27  mm. Free hand resection is taken to 15 mm, a 41 template is placed, lug holes are drilled, trial patella is placed, and it  tracks normally. Osteophytes are removed off the posterior femur with the trial in place. All trials are removed and the cut bone surfaces prepared with pulsatile lavage. Cement is mixed and once ready for implantation, the size 8 tibial implant, size  8 posterior stabilized femoral component, and the size 41 patella are cemented in place and the patella is held with the clamp. The trial insert is placed and the knee held in full extension. The Exparel (20 ml mixed with 30 ml saline) and .25% Bupivicaine, are injected into the extensor mechanism, posterior capsule, medial and lateral gutters and subcutaneous tissues.  All extruded cement is removed and once the cement is hard the permanent 6 mm posterior stabilized rotating platform insert is placed into the tibial tray.      The wound is copiously irrigated with saline solution and the extensor mechanism closed over a hemovac drain with #1 V-loc suture. The tourniquet is released for a total tourniquet time of 37  minutes. Flexion against gravity is 140 degrees and the patella tracks normally. Subcutaneous tissue is closed with 2.0 vicryl and subcuticular with running 4.0 Monocryl. The incision is cleaned and dried and steri-strips and a bulky sterile dressing are applied. The limb is placed into a knee immobilizer and the patient is awakened and transported to recovery in stable condition.      Please note that a surgical assistant was a medical necessity for this procedure in order to perform it in a safe and expeditious manner. Surgical assistant was necessary to retract the ligaments and vital neurovascular structures to prevent injury to them and also necessary for proper positioning of the limb to allow for anatomic placement of the prosthesis.   Zachary Plover Amariana Mirando, MD    11/07/2015, 11:17 AM

## 2015-11-07 NOTE — Progress Notes (Signed)
ANTICOAGULATION CONSULT NOTE - Initial Consult  Pharmacy Consult for warfarin  Indication: VTE prophylaxis  Allergies  Allergen Reactions  . Cephalosporins Hives and Itching    Patient Measurements: Height: 6' (182.9 cm) Weight: 183 lb (83 kg) IBW/kg (Calculated) : 77.6 Heparin Dosing Weight:   Vital Signs: Temp: 97.6 F (36.4 C) (09/18 1430) Temp Source: Oral (09/18 1430) BP: 121/68 (09/18 1430) Pulse Rate: 61 (09/18 1430)  Labs:  Recent Labs  11/07/15 0800  LABPROT 13.5  INR 1.03    Estimated Creatinine Clearance: 89.8 mL/min (by C-G formula based on SCr of 0.96 mg/dL).   Medical History: Past Medical History:  Diagnosis Date  . Arthritis   . Baker's cyst    right leg   . BPH (benign prostatic hypertrophy)   . Cancer ([place])    basal cell carcinoma - abd  . Chronic anticoagulation   . Endocarditis   . Family history of hiatal hernia   . Headache    migraines  . Heart murmur   . Hemorrhoids   . Hyperlipidemia   . MVP (mitral valve prolapse)   . PONV (postoperative nausea and vomiting)    with dizziness   . Proctitis   . S/P MVR (mitral valve replacement)   . Tinnitus   . Urinary frequency   . Weak urinary stream     Medications:  Scheduled:  . acetaminophen  1,000 mg Oral Q6H  . [START ON 11/08/2015] dexamethasone  10 mg Intravenous Once  . docusate sodium  100 mg Oral BID  . [START ON 11/08/2015] dutasteride  0.5 mg Oral Daily  . [START ON 11/08/2015] enoxaparin (LOVENOX) injection  30 mg Subcutaneous Q12H  . tamsulosin  0.8 mg Oral QPM  . vancomycin  1,000 mg Intravenous Q12H    Assessment: Pharmacy is consulted to dose warfarin in 60 yo male with PMH of mitral valve replacement secondary to endocarditis s/p right TKA.  Pt has been off warfarin since 9/12 and has been injecting enoxaparin 30 mg SQ q12h from 9/12 until 9/17.  Pt home regimen was  Warfarin 8 mg PO daily.    Today, 11/07/15  Baseline INR pre-op was 1.03, PT 13.5  HCT, plt  from 9/8 WNL  Pt will restart enoxaparin 30 mg SQ q12h on 9/19  Goal of Therapy:  Monitor platelets by anticoagulation protocol: Yes   Plan:   Warfarin 10 mg PO x1   CBC, BMP, INR ordered with AM labs  Monitor for signs/symptoms of bleeding  Will clarify with PCP office regarding target INR, as there are listings of 2-3 as well as 2.5-3.5 in pt chart.   Royetta Asal, PharmD, BCPS Pager [phone] 11/07/2015 3:00 PM

## 2015-11-07 NOTE — H&P (View-Only) (Signed)
Zachary Calderon DOB: 17-Aug-1955 Married / Language: Zachary Calderon / Race: White Male Date of Admission:  11/07/2015 CC:  Right Knee Pain History of Present Illness The patient is a 60 year old male who comes in for a preoperative History and Physical. The patient is scheduled for a right total knee arthroplasty to be performed by Dr. Dione Plover. Aluisio, MD at [facility] on 11/07/2015. The patient was seen in consultation and reported right knee symptoms including: pain without any known injury. The patient describes their pain as dull and aching. Prior to being seen, the patient was previously evaluated by [place] and [facility] (knee arthroscopy 2001 and 2005). Past treatment for this problem has included intra-articular injection of corticosteroids (has tried a round of synvisc with no relief. ; just 3 weeks ago had ultrasound guided injection.). Symptoms are reported to be located in the right knee and include knee pain (mild , but will have swelling from time to time.). Onset of symptoms was with symptoms now occurring intermittently. Symptoms are relieved by non-opioid analgesics. [name] had problems with his knee for many years now. He has had progressive bowing of the knee, progressive varus deformity. His pain continues to worsen. He does get swelling. He is not having lower extremity weakness or paresthesia with this. He has had cortisone and viscosupplement injections without much benefit. He is at a stage now where the knee is progressively taken over his life and he is wondering about a more permanent solution to his problem. He is now ready to proceed with surgery at this time. They have been treated conservatively in the past for the above stated problem and despite conservative measures, they continue to have progressive pain and severe functional limitations and dysfunction. They have failed non-operative management including home exercise, medications, and injections. It is  felt that they would benefit from undergoing total joint replacement. Risks and benefits of the procedure have been discussed with the patient and they elect to proceed with surgery. There are no active contraindications to surgery such as ongoing infection or rapidly progressive neurological disease.   Problem List/Past Medical Primary osteoarthritis of right knee (M17.11)  Valvular heart disease  Heart murmur  Hypercholesterolemia  Osteoarthritis  Other disease, cancer, significant illness  Prostate Disease  Migraine Headache  Tinnitus  Hemorrhoids  Enlarged prostate  Staphlococcus Infections  Valvular Heart Repair  Allergies Cephalosporins   Family History Cancer  Maternal Grandfather, Mother. Chronic Obstructive Lung Disease  Father. Congestive Heart Failure  Paternal Grandmother. Depression  Sister. Heart Disease  Father, Maternal Grandmother. Osteoarthritis  Mother.  Social History Children  2 Current drinker  07/15/2015: Currently drinks beer, wine and hard liquor 5-7 times per week Current work status  working part time Exercise  Exercises weekly; does other and gym / [employer] Living situation  live with spouse Marital status  married No history of drug/alcohol rehab  Not under pain contract  Number of flights of stairs before winded  greater than 5 Tobacco / smoke exposure  07/15/2015: no Tobacco use  Never smoker. 07/15/2015  Medication History Warfarin Sodium (5MG  Tablet, Oral) Active. Tamsulosin HCl (0.4MG  Capsule, Oral) Active.  Past Surgical History  Appendectomy  Date: 63. Arthroscopy of Knee  right ~ 2001 Tonsillectomy  1960's Valve Replacement  replaced: mitral - 11/1998 [employer] Mechanical Valve Mitral Vavle Repair  Date: 09/1998.   Review of Systems General Not Present- Chills, Fatigue, Fever, Memory Loss, Night Sweats, Weight Gain and Weight Loss. Skin Not Present-  Eczema, Hives, Itching, Lesions and  Rash. HEENT Present- Headache and Tinnitus. Not Present- Dentures, Double Vision, Hearing Loss and Visual Loss. Respiratory Not Present- Allergies, Chronic Cough, Coughing up blood, Shortness of breath at rest and Shortness of breath with exertion. Cardiovascular Not Present- Chest Pain, Difficulty Breathing Lying Down, Murmur, Palpitations, Racing/skipping heartbeats and Swelling. Gastrointestinal Not Present- Abdominal Pain, Bloody Stool, Constipation, Diarrhea, Difficulty Swallowing, Heartburn, Jaundice, Loss of appetitie, Nausea and Vomiting. Male Genitourinary Present- Urinary frequency and Weak urinary stream. Not Present- Blood in Urine, Discharge, Flank Pain, Incontinence, Painful Urination, Urgency, Urinary Retention and Urinating at Night. Musculoskeletal Present- Joint Pain and Joint Swelling. Not Present- Back Pain, Morning Stiffness, Muscle Pain, Muscle Weakness and Spasms. Neurological Not Present- Blackout spells, Difficulty with balance, Dizziness, Paralysis, Tremor and Weakness. Psychiatric Not Present- Insomnia.  Vitals Weight: 186 lb Height: 72in Body Surface Area: 2.07 m Body Mass Index: 25.23 kg/m  Pulse: 72 (Regular)  BP: 138/78 (Sitting, Right Arm, Standard)   Physical Exam General Mental Status -Alert, cooperative and good historian. General Appearance-pleasant, Not in acute distress. Orientation-Oriented X3. Build & Nutrition-Well nourished and Well developed.  Head and Neck Head-normocephalic, atraumatic . Neck Global Assessment - supple, no bruit auscultated on the right, no bruit auscultated on the left.  Eye Vision-Wears corrective lenses. Pupil - Bilateral-Regular and Round. Motion - Bilateral-EOMI.  Chest and Lung Exam Auscultation Breath sounds - clear at anterior chest wall and clear at posterior chest wall. Adventitious sounds - No Adventitious sounds.  Cardiovascular Auscultation Rhythm - Regular rate and rhythm.  Heart Sounds - S1 WNL and S2 WNL. Murmurs & Other Heart Sounds - Auscultation of the heart reveals - No Murmurs.  Abdomen Palpation/Percussion Tenderness - Abdomen is non-tender to palpation. Rigidity (guarding) - Abdomen is soft. Auscultation Auscultation of the abdomen reveals - Bowel sounds normal.  Male Genitourinary Note: Not done, not pertinent to present illness   Musculoskeletal Note: On exam, he is alert and oriented, in no apparent distress. Evaluation of his left knee, no swelling and no deformity. Range of motion 0 to 135 but no tenderness or instability. Right knee, severe varus deformity. He has got some venous stasis changes in the skin, but no edema. The stasis changes are down lower in the leg. His range of motion is 5 to 120. There is marked crepitus on range of motion with tenderness medial greater than lateral and no instability.  His radiographs show severe end-stage medial and patellofemoral arthritis of this knee.   Assessment & Plan Primary osteoarthritis of right knee (M17.11)  Note:Surgical Plans: Right Total Knee Replacement  Disposition: Home  PCP: Dr. Dianah Field  IV TXA  Anesthesia Issues: Nausea and dizziness for three days with valve repair [facility])  Patient was given instructions on when to stop his Coumandin and also given instructions for Lovenox use preop.  Signed electronically by Ok Edwards, III PA-C

## 2015-11-07 NOTE — Anesthesia Preprocedure Evaluation (Addendum)
Anesthesia Evaluation  Patient identified by MRN, date of birth, ID band Patient awake    Reviewed: Allergy & Precautions, NPO status , Patient's Chart, lab work & pertinent test results  History of Anesthesia Complications (+) PONV  Airway Mallampati: II  TM Distance: >3 FB Neck ROM: Full    Dental  (+) Dental Advisory Given   Pulmonary neg pulmonary ROS,    breath sounds clear to auscultation       Cardiovascular  Rhythm:Regular Rate:Normal  S/p mitral valve replacement with mechanical valve 08/2015 Echo: Left ventricle: Systolic function was normal. The estimated   ejection fraction was in the range of 60% to 65%. Doppler   parameters are consistent with abnormal left ventricular   relaxation (grade 1 diastolic dysfunction). - Mitral valve: MV prosthesis appears to open well Peak and mean   gradients through the valve are 7 and 4 mm Hg respectively There   was mild regurgitation. - Left atrium: The atrium was moderately dilated.   Neuro/Psych  Headaches,    GI/Hepatic negative GI ROS, Neg liver ROS,   Endo/Other  negative endocrine ROS  Renal/GU negative Renal ROS     Musculoskeletal  (+) Arthritis ,   Abdominal   Peds  Hematology On coumadin- stopped 6 days ago. Lovenox stopped one day ago   Anesthesia Other Findings   Reproductive/Obstetrics                           Lab Results  Component Value Date   WBC 5.1 10/28/2015   HGB 16.3 10/28/2015   HCT 45.7 10/28/2015   MCV 85.1 10/28/2015   PLT 137 (L) 10/28/2015   Lab Results  Component Value Date   CREATININE 0.96 10/28/2015   BUN 19 10/28/2015   NA 139 10/28/2015   K 4.3 10/28/2015   CL 104 10/28/2015   CO2 29 10/28/2015   Lab Results  Component Value Date   INR 1.03 11/07/2015   INR 2.14 10/28/2015   INR 3.0 10/21/2015    Anesthesia Physical Anesthesia Plan  ASA: III  Anesthesia Plan: MAC and Spinal   Post-op  Pain Management:    Induction: Intravenous  Airway Management Planned: Natural Airway and Simple Face Mask  Additional Equipment:   Intra-op Plan:   Post-operative Plan:   Informed Consent: I have reviewed the patients History and Physical, chart, labs and discussed the procedure including the risks, benefits and alternatives for the proposed anesthesia with the patient or authorized representative who has indicated his/her understanding and acceptance.   Dental advisory given  Plan Discussed with: CRNA  Anesthesia Plan Comments:        Anesthesia Quick Evaluation

## 2015-11-08 ENCOUNTER — Telehealth: Payer: Self-pay

## 2015-11-08 LAB — BASIC METABOLIC PANEL
ANION GAP: 6 (ref 5–15)
BUN: 17 mg/dL (ref 6–20)
CHLORIDE: 104 mmol/L (ref 101–111)
CO2: 27 mmol/L (ref 22–32)
CREATININE: 0.97 mg/dL (ref 0.61–1.24)
Calcium: 8.7 mg/dL — ABNORMAL LOW (ref 8.9–10.3)
GFR calc non Af Amer: >60 mL/min (ref >60)
Glucose, Bld: 137 mg/dL — ABNORMAL HIGH (ref 65–99)
POTASSIUM: 4.4 mmol/L (ref 3.5–5.1)
SODIUM: 137 mmol/L (ref 135–145)

## 2015-11-08 LAB — CBC
HEMATOCRIT: 38.5 % — AB (ref 39.0–52.0)
HEMOGLOBIN: 13.6 g/dL (ref 13.0–17.0)
MCH: 30.5 pg (ref 26.0–34.0)
MCHC: 35.3 g/dL (ref 30.0–36.0)
MCV: 86.3 fL (ref 78.0–100.0)
Platelets: 122 10*3/uL — ABNORMAL LOW (ref 150–400)
RBC: 4.46 MIL/uL (ref 4.22–5.81)
RDW: 12.6 % (ref 11.5–15.5)
WBC: 12.4 10*3/uL — AB (ref 4.0–10.5)

## 2015-11-08 LAB — PROTIME-INR
INR: 1.04
PROTHROMBIN TIME: 13.6 s (ref 11.4–15.2)

## 2015-11-08 MED ORDER — OXYCODONE HCL 5 MG PO TABS: 5.0000 mg | ORAL_TABLET | ORAL | 0 refills | Status: DC | PRN

## 2015-11-08 MED ORDER — WARFARIN SODIUM 5 MG PO TABS
10.0000 mg | ORAL_TABLET | Freq: Once | ORAL | Status: AC
  Administered 2015-11-08: 10 mg via ORAL
  Filled 2015-11-08: qty 2

## 2015-11-08 MED ORDER — ENOXAPARIN SODIUM 30 MG/0.3ML ~~LOC~~ SOLN: 30.0000 mg | Freq: Two times a day (BID) | SUBCUTANEOUS | 0 refills | Status: DC

## 2015-11-08 MED ORDER — TRAMADOL HCL 50 MG PO TABS: 50.0000 mg | ORAL_TABLET | Freq: Four times a day (QID) | ORAL | 1 refills | Status: DC | PRN

## 2015-11-08 MED ORDER — METHOCARBAMOL 500 MG PO TABS: 500.0000 mg | ORAL_TABLET | Freq: Four times a day (QID) | ORAL | 0 refills | Status: DC | PRN

## 2015-11-08 MED ORDER — ONDANSETRON HCL 4 MG PO TABS: 4.0000 mg | ORAL_TABLET | Freq: Four times a day (QID) | ORAL | 0 refills | Status: DC | PRN

## 2015-11-08 NOTE — Telephone Encounter (Signed)
2.5-3.5.  Mechanical heart valve

## 2015-11-08 NOTE — Discharge Summary (Signed)
Physician Discharge Summary   Patient ID: Zachary Calderon MRN: [id number] DOB/AGE: 1955/09/14 60 y.o.  Admit date: 11/07/2015 Discharge date: 11/09/2015  Primary Diagnosis:  Osteoarthritis  Right knee(s)  Admission Diagnoses:  Past Medical History:  Diagnosis Date  . Arthritis   . Baker's cyst    right leg   . BPH (benign prostatic hypertrophy)   . Cancer ([place])    basal cell carcinoma - abd  . Chronic anticoagulation   . Endocarditis   . Family history of hiatal hernia   . Headache    migraines  . Heart murmur   . Hemorrhoids   . Hyperlipidemia   . MVP (mitral valve prolapse)   . PONV (postoperative nausea and vomiting)    with dizziness   . Proctitis   . S/P MVR (mitral valve replacement)   . Tinnitus   . Urinary frequency   . Weak urinary stream    Discharge Diagnoses:   Principal Problem:   Primary osteoarthritis of right knee Active Problems:   OA (osteoarthritis) of knee  Estimated body mass index is 24.82 kg/m as calculated from the following:   Height as of this encounter: 6' (1.829 m).   Weight as of this encounter: 83 kg (183 lb).  Procedure:  Procedure(s) (LRB): RIGHT TOTAL KNEE ARTHROPLASTY (Right)   Consults: None  HPI: Zachary Calderon is a 60 y.o. year old male with end stage OA of his right knee with progressively worsening pain and dysfunction. He has constant pain, with activity and at rest and significant functional deficits with difficulties even with ADLs. He has had extensive non-op management including analgesics, injections of cortisone, and home exercise program, but remains in significant pain with significant dysfunction. Radiographs show bone on bone arthritis medial and patellofemoral. He presents now for right Total Knee Arthroplasty.    Laboratory Data: Admission on 11/07/2015  Component Date Value Ref Range Status  . Prothrombin Time 11/07/2015 13.5  11.4 - 15.2 seconds Final  . INR 11/07/2015 1.03   Final  . WBC 11/08/2015 12.4* 4.0 -  10.5 K/uL Final  . RBC 11/08/2015 4.46  4.22 - 5.81 MIL/uL Final  . Hemoglobin 11/08/2015 13.6  13.0 - 17.0 g/dL Final  . HCT 11/08/2015 38.5* 39.0 - 52.0 % Final  . MCV 11/08/2015 86.3  78.0 - 100.0 fL Final  . MCH 11/08/2015 30.5  26.0 - 34.0 pg Final  . MCHC 11/08/2015 35.3  30.0 - 36.0 g/dL Final  . RDW 11/08/2015 12.6  11.5 - 15.5 % Final  . Platelets 11/08/2015 122* 150 - 400 K/uL Final  . Sodium 11/08/2015 137  135 - 145 mmol/L Final  . Potassium 11/08/2015 4.4  3.5 - 5.1 mmol/L Final  . Chloride 11/08/2015 104  101 - 111 mmol/L Final  . CO2 11/08/2015 27  22 - 32 mmol/L Final  . Glucose, Bld 11/08/2015 137* 65 - 99 mg/dL Final  . BUN 11/08/2015 17  6 - 20 mg/dL Final  . Creatinine, Ser 11/08/2015 0.97  0.61 - 1.24 mg/dL Final  . Calcium 11/08/2015 8.7* 8.9 - 10.3 mg/dL Final  . GFR calc non Af Amer 11/08/2015 >60  >60 mL/min Final  . GFR calc Af Amer 11/08/2015 >60  >60 mL/min Final   Comment: (NOTE) The eGFR has been calculated using the CKD EPI equation. This calculation has not been validated in all clinical situations. eGFR's persistently <60 mL/min signify possible Chronic Kidney Disease.   . Anion gap 11/08/2015 6  5 - 15  Final  . Prothrombin Time 11/08/2015 13.6  11.4 - 15.2 seconds Final  . INR 11/08/2015 1.04   Final  Hospital Outpatient Visit on 10/28/2015  Component Date Value Ref Range Status  . aPTT 10/28/2015 40* 24 - 36 seconds Final   Comment:        IF BASELINE aPTT IS ELEVATED, SUGGEST PATIENT RISK ASSESSMENT BE USED TO DETERMINE APPROPRIATE ANTICOAGULANT THERAPY.   . WBC 10/28/2015 5.1  4.0 - 10.5 K/uL Final  . RBC 10/28/2015 5.37  4.22 - 5.81 MIL/uL Final  . Hemoglobin 10/28/2015 16.3  13.0 - 17.0 g/dL Final  . HCT 10/28/2015 45.7  39.0 - 52.0 % Final  . MCV 10/28/2015 85.1  78.0 - 100.0 fL Final  . MCH 10/28/2015 30.4  26.0 - 34.0 pg Final  . MCHC 10/28/2015 35.7  30.0 - 36.0 g/dL Final  . RDW 10/28/2015 12.5  11.5 - 15.5 % Final  . Platelets  10/28/2015 137* 150 - 400 K/uL Final  . Sodium 10/28/2015 139  135 - 145 mmol/L Final  . Potassium 10/28/2015 4.3  3.5 - 5.1 mmol/L Final  . Chloride 10/28/2015 104  101 - 111 mmol/L Final  . CO2 10/28/2015 29  22 - 32 mmol/L Final  . Glucose, Bld 10/28/2015 89  65 - 99 mg/dL Final  . BUN 10/28/2015 19  6 - 20 mg/dL Final  . Creatinine, Ser 10/28/2015 0.96  0.61 - 1.24 mg/dL Final  . Calcium 10/28/2015 9.4  8.9 - 10.3 mg/dL Final  . Total Protein 10/28/2015 7.3  6.5 - 8.1 g/dL Final  . Albumin 10/28/2015 4.7  3.5 - 5.0 g/dL Final  . AST 10/28/2015 25  15 - 41 U/L Final  . ALT 10/28/2015 18  17 - 63 U/L Final  . Alkaline Phosphatase 10/28/2015 50  38 - 126 U/L Final  . Total Bilirubin 10/28/2015 2.0* 0.3 - 1.2 mg/dL Final  . GFR calc non Af Amer 10/28/2015 >60  >60 mL/min Final  . GFR calc Af Amer 10/28/2015 >60  >60 mL/min Final   Comment: (NOTE) The eGFR has been calculated using the CKD EPI equation. This calculation has not been validated in all clinical situations. eGFR's persistently <60 mL/min signify possible Chronic Kidney Disease.   . Anion gap 10/28/2015 6  5 - 15 Final  . Prothrombin Time 10/28/2015 24.3* 11.4 - 15.2 seconds Final  . INR 10/28/2015 2.14   Final  . ABO/RH(D) 11/07/2015 A POS   Final  . Antibody Screen 11/07/2015 NEG   Final  . Sample Expiration 11/07/2015 11/10/2015   Final  . Extend sample reason 11/07/2015 NO TRANSFUSIONS OR PREGNANCY IN THE PAST 3 MONTHS   Final  . Color, Urine 10/28/2015 YELLOW  YELLOW Final  . APPearance 10/28/2015 CLEAR  CLEAR Final  . Specific Gravity, Urine 10/28/2015 1.015  1.005 - 1.030 Final  . pH 10/28/2015 6.0  5.0 - 8.0 Final  . Glucose, UA 10/28/2015 NEGATIVE  NEGATIVE mg/dL Final  . Hgb urine dipstick 10/28/2015 MODERATE* NEGATIVE Final  . Bilirubin Urine 10/28/2015 NEGATIVE  NEGATIVE Final  . Ketones, ur 10/28/2015 NEGATIVE  NEGATIVE mg/dL Final  . Protein, ur 10/28/2015 NEGATIVE  NEGATIVE mg/dL Final  . Nitrite  10/28/2015 NEGATIVE  NEGATIVE Final  . Leukocytes, UA 10/28/2015 NEGATIVE  NEGATIVE Final  . MRSA, PCR 10/28/2015 NEGATIVE  NEGATIVE Final  . Staphylococcus aureus 10/28/2015 NEGATIVE  NEGATIVE Final   Comment:        The Xpert SA Assay (FDA  approved for NASAL specimens in patients over 14 years of age), is one component of a comprehensive surveillance program.  Test performance has been validated by [facility] for patients greater than or equal to 75 year old. It is not intended to diagnose infection nor to guide or monitor treatment.   . Squamous Epithelial / LPF 10/28/2015 0-5* NONE SEEN Final  . WBC, UA 10/28/2015 0-5  0 - 5 WBC/hpf Final  . RBC / HPF 10/28/2015 6-30  0 - 5 RBC/hpf Final  . Bacteria, UA 10/28/2015 RARE* NONE SEEN Final  . ABO/RH(D) 10/28/2015 A POS   Final  Office Visit on 10/21/2015  Component Date Value Ref Range Status  . INR 10/21/2015 3.0   Final     X-Rays:No results found.  EKG: Orders placed or performed in visit on 08/12/15  . EKG 12-Lead  . EKG 12-Lead     Hospital Course: Umar Patmon is a 60 y.o. who was admitted to [facility]. They were brought to the operating room on 11/07/2015 and underwent Procedure(s): RIGHT TOTAL KNEE ARTHROPLASTY.  Patient tolerated the procedure well and was later transferred to the recovery room and then to the orthopaedic floor for postoperative care.  They were given PO and IV analgesics for pain control following their surgery.  They were given 24 hours of postoperative antibiotics of  Anti-infectives    Start     Dose/Rate Route Frequency Ordered Stop   11/07/15 2200  vancomycin (VANCOCIN) IVPB 1000 mg/200 mL premix     1,000 mg 200 mL/hr over 60 Minutes Intravenous Every 12 hours 11/07/15 1408 11/07/15 2236   11/07/15 0733  vancomycin (VANCOCIN) IVPB 1000 mg/200 mL premix     1,000 mg 200 mL/hr over 60 Minutes Intravenous On call to O.R. 11/07/15 7078 11/07/15 1016     and started on DVT  prophylaxis in the form of Lovenox and Coumadin.   PT and OT were ordered for total joint protocol.  Discharge planning consulted to help with postop disposition and equipment needs.  Patient had a decent night on the evening of surgery.  They started to get up OOB with therapy on day one. Hemovac drain was pulled without difficulty.  Continued to work with therapy into day two.  Dressing was changed on day two and the incision was healing well. Patient was seen in rounds and was ready to go home.  Discharge home with home health Diet - Cardiac diet Follow up - in 2 weeks Activity - WBAT Disposition - Home Condition Upon Discharge - Good D/C Meds - See DC Summary DVT Prophylaxis - Lovenox and Coumadin  Discharge Instructions    Call MD / Call 911    Complete by:  As directed    If you experience chest pain or shortness of breath, CALL 911 and be transported to the hospital emergency room.  If you develope a fever above 101 F, pus (white drainage) or increased drainage or redness at the wound, or calf pain, call your surgeon's office.   Change dressing    Complete by:  As directed    Change dressing daily with sterile 4 x 4 inch gauze dressing and apply TED hose. Do not submerge the incision under water.   Constipation Prevention    Complete by:  As directed    Drink plenty of fluids.  Prune juice may be helpful.  You may use a stool softener, such as Colace (over the counter) 100 mg twice a day.  Use MiraLax (over the counter) for constipation as needed.   Diet - low sodium heart healthy    Complete by:  As directed    Discharge instructions    Complete by:  As directed    Pick up stool softner and laxative for home use following surgery while on pain medications. Do not submerge incision under water. Please use good hand washing techniques while changing dressing each day. May shower starting three days after surgery. Please use a clean towel to pat the incision dry following  showers. Continue to use ice for pain and swelling after surgery. Do not use any lotions or creams on the incision until instructed by your surgeon.   Postoperative Constipation Protocol  Constipation - defined medically as fewer than three stools per week and severe constipation as less than one stool per week.  One of the most common issues patients have following surgery is constipation.  Even if you have a regular bowel pattern at home, your normal regimen is likely to be disrupted due to multiple reasons following surgery.  Combination of anesthesia, postoperative narcotics, change in appetite and fluid intake all can affect your bowels.  In order to avoid complications following surgery, here are some recommendations in order to help you during your recovery period.  Colace (docusate) - Pick up an over-the-counter form of Colace or another stool softener and take twice a day as long as you are requiring postoperative pain medications.  Take with a full glass of water daily.  If you experience loose stools or diarrhea, hold the colace until you stool forms back up.  If your symptoms do not get better within 1 week or if they get worse, check with your doctor.  Dulcolax (bisacodyl) - Pick up over-the-counter and take as directed by the product packaging as needed to assist with the movement of your bowels.  Take with a full glass of water.  Use this product as needed if not relieved by Colace only.   MiraLax (polyethylene glycol) - Pick up over-the-counter to have on hand.  MiraLax is a solution that will increase the amount of water in your bowels to assist with bowel movements.  Take as directed and can mix with a glass of water, juice, soda, coffee, or tea.  Take if you go more than two days without a movement. Do not use MiraLax more than once per day. Call your doctor if you are still constipated or irregular after using this medication for 7 days in a row.  If you continue to have  problems with postoperative constipation, please contact the office for further assistance and recommendations.  If you experience "the worst abdominal pain ever" or develop nausea or vomiting, please contact the office immediatly for further recommendations for treatment.   Take Coumadin for three weeks for postoperative protocol and then the patient may resume their previous Coumadin home regimen.  The dose may need to be adjusted based upon the INR.  Please follow the INR and titrate Coumadin dose for a therapeutic range between 2.0 and 3.0 INR.  After completing the three weeks of Coumadin, the patient may resume their previous Coumadin home regimen. Continue Lovenox injections until the INR is therapeutic at or greater than 2.0.  When INR reaches the therapeutic level of equal to or greater than 2.0, the patient may discontinue the Lovenox injections.   Do not put a pillow under the knee. Place it under the heel.    Complete by:  As  directed    Do not sit on low chairs, stoools or toilet seats, as it may be difficult to get up from low surfaces    Complete by:  As directed    Driving restrictions    Complete by:  As directed    No driving until released by the physician.   Increase activity slowly as tolerated    Complete by:  As directed    Lifting restrictions    Complete by:  As directed    No lifting until released by the physician.   Patient may shower    Complete by:  As directed    You may shower without a dressing once there is no drainage.  Do not wash over the wound.  If drainage remains, do not shower until drainage stops.   TED hose    Complete by:  As directed    Use stockings (TED hose) for 3 weeks on both leg(s).  You may remove them at night for sleeping.   Weight bearing as tolerated    Complete by:  As directed    Laterality:  right   Extremity:  Lower       Medication List    STOP taking these medications   aspirin-acetaminophen-caffeine 250-250-65 MG  tablet Commonly known as:  EXCEDRIN MIGRAINE   ibuprofen 200 MG tablet Commonly known as:  ADVIL,MOTRIN     TAKE these medications   dutasteride 0.5 MG capsule Commonly known as:  AVODART Take 1 capsule (0.5 mg total) by mouth daily.   enoxaparin 30 MG/0.3ML injection Commonly known as:  LOVENOX Inject 0.3 mLs (30 mg total) into the skin every 12 (twelve) hours. Continue Lovenox injections until the INR is therapeutic at or greater than 2.0.  When INR reaches the therapeutic level of equal to or greater than 2.0, the patient may discontinue the Lovenox injections. Start taking on:  11/09/2015 What changed:  additional instructions   methocarbamol 500 MG tablet Commonly known as:  ROBAXIN Take 1 tablet (500 mg total) by mouth every 6 (six) hours as needed for muscle spasms.   ondansetron 4 MG tablet Commonly known as:  ZOFRAN Take 1 tablet (4 mg total) by mouth every 6 (six) hours as needed for nausea.   oxyCODONE 5 MG immediate release tablet Commonly known as:  Oxy IR/ROXICODONE Take 1-2 tablets (5-10 mg total) by mouth every 3 (three) hours as needed for breakthrough pain.   rizatriptan 10 MG disintegrating tablet Commonly known as:  MAXALT-MLT Take 1 tablet (10 mg total) by mouth as needed for migraine. May repeat in 2 hours if needed   tamsulosin 0.4 MG Caps capsule Commonly known as:  FLOMAX Take 2 capsules (0.8 mg total) by mouth daily. What changed:  when to take this   traMADol 50 MG tablet Commonly known as:  ULTRAM Take 1-2 tablets (50-100 mg total) by mouth every 6 (six) hours as needed for moderate pain.   warfarin 1 MG tablet Commonly known as:  COUMADIN TAKE AS DIRECTED What changed:  See the new instructions.   warfarin 5 MG tablet Commonly known as:  COUMADIN TAKE AS DIRECTED (60 TABS PER 30 DAYS PER MD) What changed:  Another medication with the same name was changed. Make sure you understand how and when to take each.      Follow-up Information     [facility] .   Why:  now known as [facility].  Your home health agency will call you to  schedule your at home physical therapy.  Contact information: [address] [place] [address] [phone]        Inc[facility] - [facility] .   Why:  rolling walker and 3n1 (over the commode seat) Contact information: [address] [place] [address] [phone]        ALUISIO,FRANK V, MD. Schedule an appointment as soon as possible for a visit on 11/22/2015.   Specialty:  Orthopedic Surgery Contact information: [address] [place] [address] [phone]           Signed: Arlee Muslim, PA-C Orthopaedic Surgery 11/08/2015, 11:56 PM

## 2015-11-08 NOTE — Telephone Encounter (Signed)
Zachary Calderon at [facility] notified

## 2015-11-08 NOTE — Telephone Encounter (Signed)
Pt was admitted to [facility].  They are asking for clarification of his INR range.

## 2015-11-08 NOTE — Progress Notes (Signed)
ANTICOAGULATION CONSULT NOTE - Follow-Up  Pharmacy Consult for warfarin  Indication: VTE prophylaxis  Allergies  Allergen Reactions  . Cephalosporins Hives and Itching    Patient Measurements: Height: 6' (182.9 cm) Weight: 183 lb (83 kg) IBW/kg (Calculated) : 77.6 Heparin Dosing Weight:   Vital Signs: Temp: 98 F (36.7 C) (09/19 0605) Temp Source: Oral (09/19 0605) BP: 110/55 (09/19 0605) Pulse Rate: 65 (09/19 0605)  Labs:  Recent Labs  11/07/15 0800 11/08/15 0501  HGB  --  13.6  HCT  --  38.5*  PLT  --  122*  LABPROT 13.5 13.6  INR 1.03 1.04  CREATININE  --  0.97    Estimated Creatinine Clearance: 88.9 mL/min (by C-G formula based on SCr of 0.97 mg/dL).   Medical History: Past Medical History:  Diagnosis Date  . Arthritis   . Baker's cyst    right leg   . BPH (benign prostatic hypertrophy)   . Cancer ([place])    basal cell carcinoma - abd  . Chronic anticoagulation   . Endocarditis   . Family history of hiatal hernia   . Headache    migraines  . Heart murmur   . Hemorrhoids   . Hyperlipidemia   . MVP (mitral valve prolapse)   . PONV (postoperative nausea and vomiting)    with dizziness   . Proctitis   . S/P MVR (mitral valve replacement)   . Tinnitus   . Urinary frequency   . Weak urinary stream     Medications:  Scheduled:  . docusate sodium  100 mg Oral BID  . dutasteride  0.5 mg Oral Daily  . enoxaparin (LOVENOX) injection  30 mg Subcutaneous Q12H  . tamsulosin  0.8 mg Oral QPM  . Warfarin - Pharmacist Dosing Inpatient   Does not apply q1800    Assessment: Pharmacy is consulted to dose warfarin in 60 yo male with PMH of mitral valve replacement secondary to endocarditis s/p right TKA.  Pt has been off warfarin since 9/12 and has been injecting enoxaparin 30 mg SQ q12h from 9/12 until 9/17.  Pt home regimen was  Warfarin 8 mg PO daily.    Today, 11/08/15  INR  1.04, PT 13.6. Not expected to move much as lab was drawn less than 12  hours after dose  Hgb has decreased post-op as expected but still within WNL. Plt low, will monitor  Continue enoxaparin 30 mg SQ q12h on 9/19  Clarified with PCP office that target INR is 2.5 to 3.5  Goal of Therapy:  Monitor platelets by anticoagulation protocol: Yes   Plan:   Warfarin 10 mg PO x1   CBC, BMP, INR ordered with AM labs  Monitor for signs/symptoms of bleeding  Royetta Asal, PharmD, BCPS Pager [phone] 11/08/2015 1:41 PM

## 2015-11-08 NOTE — Evaluation (Signed)
Occupational Therapy Evaluation Patient Details Name: Zachary Calderon MRN: [id number] DOB: [birth year] Today's Date: 11/08/2015    History of Present Illness R TKA   Clinical Impression   Pt is s/p TKA resulting in the deficits listed below (see OT Problem List).  Pt will benefit from skilled OT to increase their safety and independence with ADL and functional mobility for ADL to facilitate discharge to venue listed below.        Follow Up Recommendations  No OT follow up    Equipment Recommendations  3 in 1 bedside comode       Precautions / Restrictions Restrictions Weight Bearing Restrictions: No RLE Weight Bearing: Weight bearing as tolerated      Mobility Bed Mobility Overal bed mobility: Needs Assistance Bed Mobility: Supine to Sit     Supine to sit: Min assist;HOB elevated        Transfers Overall transfer level: Needs assistance Equipment used: Rolling walker (2 wheeled)   Sit to Stand: Min assist         General transfer comment: cues for safety         ADL Overall ADL's : Needs assistance/impaired                     Lower Body Dressing: Minimal assistance;Sit to/from stand;Cueing for sequencing;Cueing for compensatory techniques   Toilet Transfer: Min guard;RW;Ambulation;Cueing for sequencing;Cueing for safety   Toileting- Water quality scientist and Hygiene: Min guard;Sit to/from stand;Cueing for sequencing                         Pertinent Vitals/Pain Pain Score: 3  Pain Location: r knee Pain Descriptors / Indicators: Sore Pain Intervention(s): Monitored during session;Repositioned;Ice applied     Hand Dominance     Extremity/Trunk Assessment Upper Extremity Assessment Upper Extremity Assessment: Overall WFL for tasks assessed           Communication Communication Communication: No difficulties   Cognition Arousal/Alertness: Awake/alert Behavior During Therapy: WFL for tasks assessed/performed Overall  Cognitive Status: Within Functional Limits for tasks assessed                                Home Living Family/patient expects to be discharged to:: Private residence Living Arrangements: Spouse/significant other Available Help at Discharge: Family Type of Home: House Home Access: Stairs to enter [employer] of Steps: 4 + 8 or 4  Entrance Stairs-Rails: None Home Layout: Two level;Able to live on main level with bedroom/bathroom     Bathroom Shower/Tub: Tub/shower unit         Home Equipment: Crutches          Prior Functioning/Environment Level of Independence: Independent                 OT Problem List: Decreased strength;Decreased knowledge of use of DME or AE   OT Treatment/Interventions: Self-care/ADL training;DME and/or AE instruction;Patient/family education    OT Goals(Current goals can be found in the care plan section) Acute Rehab OT Goals Patient Stated Goal: to walk OT Goal Formulation: With patient Time For Goal Achievement: 11/22/15 Potential to Achieve Goals: Good  OT Frequency: Min 2X/week   Barriers to D/C:               End of Session Equipment Utilized During Treatment: Rolling walker;Right knee immobilizer CPM Right Knee CPM Right Knee: Off Nurse Communication: Mobility status  Activity Tolerance: Patient tolerated treatment well Patient left: in chair;with call bell/phone within reach   Time: 0925-1004 OT Time Calculation (min): 39 min Charges:  OT General Charges $OT Visit: 1 Procedure OT Evaluation $OT Eval Low Complexity: 1 Procedure OT Treatments $Self Care/Home Management : 8-22 mins G-Codes:    Payton Mccallum D 2015/11/25, 10:14 AM

## 2015-11-08 NOTE — Discharge Instructions (Signed)
° °Dr. Frank Aluisio °Total Joint Specialist °[facility] °[address] °[place], [place] [address] °[phone] ° °TOTAL KNEE REPLACEMENT POSTOPERATIVE DIRECTIONS ° °Knee Rehabilitation, Guidelines Following Surgery  °Results after knee surgery are often greatly improved when you follow the exercise, range of motion and muscle strengthening exercises prescribed by your doctor. Safety measures are also important to protect the knee from further injury. Any time any of these exercises cause you to have increased pain or swelling in your knee joint, decrease the amount until you are comfortable again and slowly increase them. If you have problems or questions, call your caregiver or physical therapist for advice.  ° °HOME CARE INSTRUCTIONS  °Remove items at home which could result in a fall. This includes throw rugs or furniture in walking pathways.  °· ICE to the affected knee every three hours for 30 minutes at a time and then as needed for pain and swelling.  Continue to use ice on the knee for pain and swelling from surgery. You may notice swelling that will progress down to the foot and ankle.  This is normal after surgery.  Elevate the leg when you are not up walking on it.   °· Continue to use the breathing machine which will help keep your temperature down.  It is common for your temperature to cycle up and down following surgery, especially at night when you are not up moving around and exerting yourself.  The breathing machine keeps your lungs expanded and your temperature down. °· Do not place pillow under knee, focus on keeping the knee straight while resting ° °DIET °You may resume your previous home diet once your are discharged from the hospital. ° °DRESSING / WOUND CARE / SHOWERING °You may shower 3 days after surgery, but keep the wounds dry during showering.  You may use an occlusive plastic wrap (Press'n Seal for example), NO SOAKING/SUBMERGING IN THE BATHTUB.  If the  bandage gets wet, change with a clean dry gauze.  If the incision gets wet, pat the wound dry with a clean towel. °You may start showering once you are discharged home but do not submerge the incision under water. Just pat the incision dry and apply a dry gauze dressing on daily. °Change the surgical dressing daily and reapply a dry dressing each time. ° °ACTIVITY °Walk with your walker as instructed. °Use walker as long as suggested by your caregivers. °Avoid periods of inactivity such as sitting longer than an hour when not asleep. This helps prevent blood clots.  °You may resume a sexual relationship in one month or when given the OK by your doctor.  °You may return to work once you are cleared by your doctor.  °Do not drive a car for 6 weeks or until released by you surgeon.  °Do not drive while taking narcotics. ° °WEIGHT BEARING °Weight bearing as tolerated with assist device (walker, cane, etc) as directed, use it as long as suggested by your surgeon or therapist, typically at least 4-6 weeks. ° °POSTOPERATIVE CONSTIPATION PROTOCOL °Constipation - defined medically as fewer than three stools per week and severe constipation as less than one stool per week. ° °One of the most common issues patients have following surgery is constipation.  Even if you have a regular bowel pattern at home, your normal regimen is likely to be disrupted due to multiple reasons following surgery.  Combination of anesthesia, postoperative narcotics, change in appetite and fluid intake all can affect your bowels.    In order to avoid complications following surgery, here are some recommendations in order to help you during your recovery period. ° °Colace (docusate) - Pick up an over-the-counter form of Colace or another stool softener and take twice a day as long as you are requiring postoperative pain medications.  Take with a full glass of water daily.  If you experience loose stools or diarrhea, hold the colace until you stool forms  back up.  If your symptoms do not get better within 1 week or if they get worse, check with your doctor. ° °Dulcolax (bisacodyl) - Pick up over-the-counter and take as directed by the product packaging as needed to assist with the movement of your bowels.  Take with a full glass of water.  Use this product as needed if not relieved by Colace only.  ° °MiraLax (polyethylene glycol) - Pick up over-the-counter to have on hand.  MiraLax is a solution that will increase the amount of water in your bowels to assist with bowel movements.  Take as directed and can mix with a glass of water, juice, soda, coffee, or tea.  Take if you go more than two days without a movement. °Do not use MiraLax more than once per day. Call your doctor if you are still constipated or irregular after using this medication for 7 days in a row. ° °If you continue to have problems with postoperative constipation, please contact the office for further assistance and recommendations.  If you experience "the worst abdominal pain ever" or develop nausea or vomiting, please contact the office immediatly for further recommendations for treatment. ° °ITCHING ° If you experience itching with your medications, try taking only a single pain pill, or even half a pain pill at a time.  You can also use Benadryl over the counter for itching or also to help with sleep.  ° °TED HOSE STOCKINGS °Wear the elastic stockings on both legs for three weeks following surgery during the day but you may remove then at night for sleeping. ° °MEDICATIONS °See your medication summary on the “After Visit Summary” that the nursing staff will review with you prior to discharge.  You may have some home medications which will be placed on hold until you complete the course of blood thinner medication.  It is important for you to complete the blood thinner medication as prescribed by your surgeon.  Continue your approved medications as instructed at time of  discharge. ° °PRECAUTIONS °If you experience chest pain or shortness of breath - call 911 immediately for transfer to the hospital emergency department.  °If you develop a fever greater that 101 F, purulent drainage from wound, increased redness or drainage from wound, foul odor from the wound/dressing, or calf pain - CONTACT YOUR SURGEON.   °                                                °FOLLOW-UP APPOINTMENTS °Make sure you keep all of your appointments after your operation with your surgeon and caregivers. You should call the office at the above phone number and make an appointment for approximately two weeks after the date of your surgery or on the date instructed by your surgeon outlined in the "After Visit Summary". ° ° °RANGE OF MOTION AND STRENGTHENING EXERCISES  °Rehabilitation of the knee is important following a knee injury or   an operation. After just a few days of immobilization, the muscles of the thigh which control the knee become weakened and shrink (atrophy). Knee exercises are designed to build up the tone and strength of the thigh muscles and to improve knee motion. Often times heat used for twenty to thirty minutes before working out will loosen up your tissues and help with improving the range of motion but do not use heat for the first two weeks following surgery. These exercises can be done on a training (exercise) mat, on the floor, on a table or on a bed. Use what ever works the best and is most comfortable for you Knee exercises include:  °Leg Lifts - While your knee is still immobilized in a splint or cast, you can do straight leg raises. Lift the leg to 60 degrees, hold for 3 sec, and slowly lower the leg. Repeat 10-20 times 2-3 times daily. Perform this exercise against resistance later as your knee gets better.  °Quad and Hamstring Sets - Tighten up the muscle on the front of the thigh (Quad) and hold for 5-10 sec. Repeat this 10-20 times hourly. Hamstring sets are done by pushing the  foot backward against an object and holding for 5-10 sec. Repeat as with quad sets.  °· Leg Slides: Lying on your back, slowly slide your foot toward your buttocks, bending your knee up off the floor (only go as far as is comfortable). Then slowly slide your foot back down until your leg is flat on the floor again. °· Angel Wings: Lying on your back spread your legs to the side as far apart as you can without causing discomfort.  °A rehabilitation program following serious knee injuries can speed recovery and prevent re-injury in the future due to weakened muscles. Contact your doctor or a physical therapist for more information on knee rehabilitation.  ° °IF YOU ARE TRANSFERRED TO A SKILLED REHAB FACILITY °If the patient is transferred to a skilled rehab facility following release from the hospital, a list of the current medications will be sent to the facility for the patient to continue.  When discharged from the skilled rehab facility, please have the facility set up the patient's Home Health Physical Therapy prior to being released. Also, the skilled facility will be responsible for providing the patient with their medications at time of release from the facility to include their pain medication, the muscle relaxants, and their blood thinner medication. If the patient is still at the rehab facility at time of the two week follow up appointment, the skilled rehab facility will also need to assist the patient in arranging follow up appointment in our office and any transportation needs. ° °MAKE SURE YOU:  °Understand these instructions.  °Get help right away if you are not doing well or get worse.  ° ° °Pick up stool softner and laxative for home use following surgery while on pain medications. °Do not submerge incision under water. °Please use good hand washing techniques while changing dressing each day. °May shower starting three days after surgery. °Please use a clean towel to pat the incision dry following  showers. °Continue to use ice for pain and swelling after surgery. °Do not use any lotions or creams on the incision until instructed by your surgeon. ° °Take Coumadin for three weeks for postoperative protocol and then the patient may resume their previous Coumadin home regimen.  The dose may need to be adjusted based upon the INR.  Please follow the INR   and titrate Coumadin dose for a therapeutic range between 2.0 and 3.0 INR.  After completing the three weeks of Coumadin, the patient may resume their previous Coumadin home regimen. ° °Continue Lovenox injections until the INR is therapeutic at or greater than 2.0.  When INR reaches the therapeutic level of equal to or greater than 2.0, the patient may discontinue the Lovenox injections. ° °

## 2015-11-08 NOTE — Progress Notes (Signed)
Physical Therapy Treatment Patient Details Name: Liberato Boakye MRN: [id number] DOB: 11/08/1955 Today's Date: 11/08/2015    History of Present Illness R TKA    PT Comments    POD # 1 am session Assisted with amb in hallway then performed TKR TE's following HEP handout.  Instructed on proper tech as well as use of ICE.   Follow Up Recommendations  Home health PT;Supervision/Assistance - 24 hour     Equipment Recommendations  Rolling walker with 5" wheels    Recommendations for Other Services       Precautions / Restrictions Precautions Precautions: Knee Restrictions Weight Bearing Restrictions: No RLE Weight Bearing: Weight bearing as tolerated    Mobility  Bed Mobility Overal bed mobility: Needs Assistance Bed Mobility: Sit to Supine       Sit to supine: Min guard   General bed mobility comments: assuist with r leg  Transfers Overall transfer level: Needs assistance Equipment used: Rolling walker (2 wheeled)   Sit to Stand: Min guard;Min assist         General transfer comment: cues for safety  Ambulation/Gait Ambulation/Gait assistance: Min guard;Min assist Ambulation Distance (Feet): 85 Feet Assistive device: Rolling walker (2 wheeled) Gait Pattern/deviations: Step-to pattern;Step-through pattern;Decreased stance time - right     General Gait Details: cues for sequence and posture   Stairs            Wheelchair Mobility    Modified Rankin (Stroke Patients Only)       Balance                                    Cognition Arousal/Alertness: Awake/alert Behavior During Therapy: WFL for tasks assessed/performed Overall Cognitive Status: Within Functional Limits for tasks assessed                      Exercises   Total Knee Replacement TE's 10 reps B LE ankle pumps 10 reps towel squeezes 10 reps knee presses 10 reps heel slides  10 reps SAQ's 10 reps SLR's 10 reps ABD Followed by ICE     General Comments         Pertinent Vitals/Pain Pain Assessment: 0-10 Pain Score: 5  Pain Location: R knee Pain Descriptors / Indicators: Operative site guarding;Tender Pain Intervention(s): Monitored during session;Repositioned;Ice applied    Home Living                      Prior Function            PT Goals (current goals can now be found in the care plan section) Progress towards PT goals: Progressing toward goals    Frequency    7X/week      PT Plan Current plan remains appropriate    Co-evaluation             End of Session Equipment Utilized During Treatment: Gait belt Activity Tolerance: Patient tolerated treatment well Patient left: in chair;with call bell/phone within reach;with chair alarm set     Time: [id number] PT Time Calculation (min) (ACUTE ONLY): 41 min  Charges:  $Gait Training: 8-22 mins $Therapeutic Exercise: 8-22 mins $Therapeutic Activity: 8-22 mins                    G Codes:      Rica Koyanagi  PTA [facility]  Acute  Rehab Pager  [phone]  

## 2015-11-08 NOTE — Care Management Note (Signed)
Case Management Note  Patient Details  Name: Zachary Calderon MRN: [id number] Date of Birth: 1956/01/26  Subjective/Objective:                  RIGHT TOTAL KNEE ARTHROPLASTY (Right) Action/Plan: Discharge planning Expected Discharge Date:  11/09/15               Expected Discharge Plan:  [facility]  In-House Referral:     Discharge planning Services  CM Consult  Post Acute Care Choice:  Home Health Choice offered to:  Patient  DME Arranged:  3-N-1, Walker rolling DME Agency:  [place]:  PT [place] Agency:  [facility] (formerly [facility])  Status of Service:  Completed, signed off  If discussed at [employer], dates discussed:    Additional Comments: CM met with pt in room to offer choice of home health agency.  Pt chooses [facility].  Referral given to [facility] rep, tim (on unit).  Cm notified [facility]ME rep, Jermaine to please deliver the rolling walker and 3n1 to room prior to discharge.  No other CM needs were communicated. Dellie Catholic, RN 11/08/2015, 1:02 PM

## 2015-11-08 NOTE — Progress Notes (Signed)
   Subjective: 1 Day Post-Op Procedure(s) (LRB): RIGHT TOTAL KNEE ARTHROPLASTY (Right) Patient reports pain as mild.   Patient seen in rounds for Dr. Wynelle Link. Patient is well, but has had some minor complaints of pain in the knee, requiring pain medications We will resume therapy today. Walked 30 feet yesterday Plan is to go Home after hospital stay.  Objective: Vital signs in last 24 hours: Temp:  [97.6 F (36.4 C)-98.6 F (37 C)] 98 F (36.7 C) (09/19 0605) Pulse Rate:  [56-69] 65 (09/19 0605) Resp:  [5-18] 14 (09/19 0605) BP: (97-124)/(55-83) 110/55 (09/19 0605) SpO2:  [97 %-100 %] 100 % (09/19 0605) Weight:  [83 kg (183 lb)] 83 kg (183 lb) (09/18 0805)  Intake/Output from previous day:  Intake/Output Summary (Last 24 hours) at 11/08/15 0744 Last data filed at 11/08/15 [id number]  Gross per 24 hour  Intake          4128.33 ml  Output             4415 ml  Net          -286.67 ml    Intake/Output this shift: No intake/output data recorded.  Labs:  Recent Labs  11/08/15 0501  HGB 13.6    Recent Labs  11/08/15 0501  WBC 12.4*  RBC 4.46  HCT 38.5*  PLT 122*    Recent Labs  11/08/15 0501  NA 137  K 4.4  CL 104  CO2 27  BUN 17  CREATININE 0.97  GLUCOSE 137*  CALCIUM 8.7*    Recent Labs  11/07/15 0800 11/08/15 0501  INR 1.03 1.04    EXAM General - Patient is Alert, Appropriate and Oriented Extremity - Neurovascular intact Sensation intact distally Dorsiflexion/Plantar flexion intact Dressing - dressing C/D/I Motor Function - intact, moving foot and toes well on exam.  Hemovac pulled without difficulty.  Past Medical History:  Diagnosis Date  . Arthritis   . Baker's cyst    right leg   . BPH (benign prostatic hypertrophy)   . Cancer ([place])    basal cell carcinoma - abd  . Chronic anticoagulation   . Endocarditis   . Family history of hiatal hernia   . Headache    migraines  . Heart murmur   . Hemorrhoids   . Hyperlipidemia   . MVP  (mitral valve prolapse)   . PONV (postoperative nausea and vomiting)    with dizziness   . Proctitis   . S/P MVR (mitral valve replacement)   . Tinnitus   . Urinary frequency   . Weak urinary stream     Assessment/Plan: 1 Day Post-Op Procedure(s) (LRB): RIGHT TOTAL KNEE ARTHROPLASTY (Right) Principal Problem:   Primary osteoarthritis of right knee Active Problems:   OA (osteoarthritis) of knee  Estimated body mass index is 24.82 kg/m as calculated from the following:   Height as of this encounter: 6' (1.829 m).   Weight as of this encounter: 83 kg (183 lb). Up with therapy Plan for discharge tomorrow Discharge home with home health  DVT Prophylaxis - Lovenox and Coumadin Weight-Bearing as tolerated to right leg D/C O2 and Pulse OX and try on Room Air  Arlee Muslim, PA-C Orthopaedic Surgery 11/08/2015, 7:44 AM

## 2015-11-08 NOTE — Progress Notes (Signed)
Physical Therapy Treatment Patient Details Name: Zachary Calderon MRN: [id number] DOB: July 25, 1955 Today's Date: 11/08/2015    History of Present Illness R TKA    PT Comments    POD # 1 pm session Assisted with amb in hallway then to bathroom then back to bed to perform some TKR TE's followed by ICE and CPM.  Pt progressing well and plans to D/C to home tomorrow.   Follow Up Recommendations  Home health PT;Supervision/Assistance - 24 hour     Equipment Recommendations  Rolling walker with 5" wheels    Recommendations for Other Services       Precautions / Restrictions Precautions Precautions: Knee Restrictions Weight Bearing Restrictions: No RLE Weight Bearing: Weight bearing as tolerated    Mobility  Bed Mobility Overal bed mobility: Needs Assistance Bed Mobility: Sit to Supine       Sit to supine: Min guard   General bed mobility comments: assuist with r leg  Transfers Overall transfer level: Needs assistance Equipment used: Rolling walker (2 wheeled)   Sit to Stand: Min guard;Min assist         General transfer comment: cues for safety  Ambulation/Gait Ambulation/Gait assistance: Min guard;Min assist Ambulation Distance (Feet): 85 Feet Assistive device: Rolling walker (2 wheeled) Gait Pattern/deviations: Step-to pattern;Step-through pattern;Decreased stance time - right     General Gait Details: cues for sequence and posture   Stairs            Wheelchair Mobility    Modified Rankin (Stroke Patients Only)       Balance                                    Cognition Arousal/Alertness: Awake/alert Behavior During Therapy: WFL for tasks assessed/performed Overall Cognitive Status: Within Functional Limits for tasks assessed                      Exercises   Total Knee Replacement TE's 10 reps B LE ankle pumps 10 reps towel squeezes 10 reps knee presses 10 reps heel slides  10 reps SAQ's 10 reps SLR's 10 reps  ABD Followed by ICE     General Comments        Pertinent Vitals/Pain Pain Assessment: 0-10 Pain Score: 5  Pain Location: R knee Pain Descriptors / Indicators: Operative site guarding;Tender Pain Intervention(s): Monitored during session;Repositioned;Ice applied    Home Living                      Prior Function            PT Goals (current goals can now be found in the care plan section) Progress towards PT goals: Progressing toward goals    Frequency    7X/week      PT Plan Current plan remains appropriate    Co-evaluation             End of Session Equipment Utilized During Treatment: Gait belt Activity Tolerance: Patient tolerated treatment well Patient left: in chair;with call bell/phone within reach;with chair alarm set     Time: 1402-1430 PT Time Calculation (min) (ACUTE ONLY): 28 min  Charges:  $Gait Training: 8-22 mins $Therapeutic Activity: 8-22 mins                    G Codes:      Rica Koyanagi  PTA [facility]  Acute  Rehab Pager      [phone]

## 2015-11-09 LAB — BASIC METABOLIC PANEL
ANION GAP: 7 (ref 5–15)
BUN: 12 mg/dL (ref 6–20)
CALCIUM: 8.8 mg/dL — AB (ref 8.9–10.3)
CO2: 29 mmol/L (ref 22–32)
CREATININE: 0.84 mg/dL (ref 0.61–1.24)
Chloride: 101 mmol/L (ref 101–111)
GFR calc Af Amer: >60 mL/min (ref >60)
GLUCOSE: 116 mg/dL — AB (ref 65–99)
Potassium: 3.9 mmol/L (ref 3.5–5.1)
Sodium: 137 mmol/L (ref 135–145)

## 2015-11-09 LAB — CBC
HEMATOCRIT: 36 % — AB (ref 39.0–52.0)
Hemoglobin: 12.5 g/dL — ABNORMAL LOW (ref 13.0–17.0)
MCH: 30.9 pg (ref 26.0–34.0)
MCHC: 34.7 g/dL (ref 30.0–36.0)
MCV: 88.9 fL (ref 78.0–100.0)
Platelets: 116 10*3/uL — ABNORMAL LOW (ref 150–400)
RBC: 4.05 MIL/uL — ABNORMAL LOW (ref 4.22–5.81)
RDW: 13 % (ref 11.5–15.5)
WBC: 9.2 10*3/uL (ref 4.0–10.5)

## 2015-11-09 LAB — PROTIME-INR
INR: 1.32
Prothrombin Time: 16.4 seconds — ABNORMAL HIGH (ref 11.4–15.2)

## 2015-11-09 MED ORDER — WARFARIN SODIUM 5 MG PO TABS
10.0000 mg | ORAL_TABLET | Freq: Once | ORAL | Status: AC
  Administered 2015-11-09: 10 mg via ORAL
  Filled 2015-11-09: qty 2

## 2015-11-09 NOTE — Progress Notes (Signed)
Pt to d/c home with [facility] for PT and RN. DME delivered to room prior to d/c. AVS reviewed and "My Chart" discussed with pt. Pt capable of verbalizing medications, dressing changes, signs and symptoms of infection, and follow-up appointments. Remains hemodynamically stable. No signs and symptoms of distress. Educated pt to return to ER in the case of SOB, dizziness, or chest pain.

## 2015-11-09 NOTE — Progress Notes (Signed)
Physical Therapy Treatment Patient Details Name: Zachary Calderon MRN: [id number] DOB: 08-11-1955 Today's Date: 11/09/2015    History of Present Illness R TKA    PT Comments    POD # 2 am session Assisted with amb a greater distance in hallway, practiced stairs then performed TKR TE's followed by ICE.  Pt plans to D/C to home today.  Will need another session to perform stairs with spouse.   Follow Up Recommendations  Home health PT;Supervision/Assistance - 24 hour     Equipment Recommendations  Rolling walker with 5" wheels    Recommendations for Other Services       Precautions / Restrictions Precautions Precautions: Knee Restrictions Weight Bearing Restrictions: No RLE Weight Bearing: Weight bearing as tolerated    Mobility  Bed Mobility Overal bed mobility: Needs Assistance Bed Mobility: Supine to Sit     Supine to sit: Min guard     General bed mobility comments: assuist with r leg  Transfers Overall transfer level: Needs assistance Equipment used: Rolling walker (2 wheeled) Transfers: Sit to/from Stand Sit to Stand: Supervision;Min guard         General transfer comment: cues for safety  Ambulation/Gait Ambulation/Gait assistance: Supervision;Min guard Ambulation Distance (Feet): 95 Feet Assistive device: Rolling walker (2 wheeled) Gait Pattern/deviations: Step-to pattern;Step-through pattern Gait velocity: WFL   General Gait Details: cues for sequence and posture   Stairs Stairs: Yes Stairs assistance: Min guard Stair Management: No rails;Alternating pattern;Forwards Number of Stairs: 4 General stair comments: 2 different situations.  Up forward + 2 arm over arm assist then second up forward with one rail and one crutch.  Pt has no rails to enter home and one rail in home to second floor.  Wheelchair Mobility    Modified Rankin (Stroke Patients Only)       Balance                                    Cognition  Arousal/Alertness: Awake/alert Behavior During Therapy: WFL for tasks assessed/performed Overall Cognitive Status: Within Functional Limits for tasks assessed                      Exercises   Total Knee Replacement TE's 10 reps B LE ankle pumps 10 reps towel squeezes 10 reps knee presses 10 reps heel slides  10 reps SAQ's 10 reps SLR's 10 reps ABD Followed by ICE     General Comments        Pertinent Vitals/Pain Pain Assessment: 0-10 Pain Score: 4  Pain Location: R knee Pain Descriptors / Indicators: Grimacing;Tender;Tightness Pain Intervention(s): Monitored during session;Repositioned;Ice applied    Home Living                      Prior Function            PT Goals (current goals can now be found in the care plan section) Progress towards PT goals: Progressing toward goals    Frequency    7X/week      PT Plan Current plan remains appropriate    Co-evaluation             End of Session Equipment Utilized During Treatment: Gait belt Activity Tolerance: Patient tolerated treatment well Patient left: in chair;with call bell/phone within reach;with chair alarm set     Time: 0950-1030 PT Time Calculation (min) (ACUTE ONLY): 40 min  Charges:  $  Gait Training: 8-22 mins $Therapeutic Exercise: 8-22 mins $Therapeutic Activity: 8-22 mins                    G Codes:      Rica Koyanagi  PTA [facility]  Acute  Rehab Pager      [phone]

## 2015-11-09 NOTE — Progress Notes (Signed)
Physical Therapy Treatment Patient Details Name: Zachary Calderon MRN: [id number] DOB: 01-04-1956 Today's Date: 11/09/2015    History of Present Illness R TKA    PT Comments    POD # 2 pm session Spouse present for "hands on" instruction for safe handling during transfers, gait and stairs.    Follow Up Recommendations  Home health PT;Supervision/Assistance - 24 hour     Equipment Recommendations  Rolling walker with 5" wheels    Recommendations for Other Services       Precautions / Restrictions Precautions Precautions: Knee Restrictions Weight Bearing Restrictions: No RLE Weight Bearing: Weight bearing as tolerated    Mobility  Bed Mobility Overal bed mobility: Needs Assistance Bed Mobility: Supine to Sit     Supine to sit: Min guard     General bed mobility comments: assuist with r leg  Transfers Overall transfer level: Needs assistance Equipment used: Rolling walker (2 wheeled) Transfers: Sit to/from Stand Sit to Stand: Supervision;Min guard         General transfer comment: cues for safety  Ambulation/Gait Ambulation/Gait assistance: Supervision;Min guard Ambulation Distance (Feet): 95 Feet Assistive device: Rolling walker (2 wheeled) Gait Pattern/deviations: Step-to pattern;Step-through pattern Gait velocity: WFL   General Gait Details: cues for sequence and posture   Stairs Stairs: Yes Stairs assistance: Min guard Stair Management: No rails;Alternating pattern;Forwards Number of Stairs: 4 General stair comments: 2 different situations.  Up forward + 2 arm over arm assist then second up forward with one rail and one crutch.  Pt has no rails to enter home and one rail in home to second floor. spouse present for "hands on" education.  Wheelchair Mobility    Modified Rankin (Stroke Patients Only)       Balance                                    Cognition Arousal/Alertness: Awake/alert Behavior During Therapy: WFL for tasks  assessed/performed Overall Cognitive Status: Within Functional Limits for tasks assessed                      Exercises      General Comments        Pertinent Vitals/Pain Pain Assessment: 0-10 Pain Score: 4  Pain Location: R knee Pain Descriptors / Indicators: Grimacing;Tender;Tightness Pain Intervention(s): Monitored during session;Repositioned;Ice applied    Home Living                      Prior Function            PT Goals (current goals can now be found in the care plan section) Progress towards PT goals: Progressing toward goals    Frequency    7X/week      PT Plan Current plan remains appropriate    Co-evaluation             End of Session Equipment Utilized During Treatment: Gait belt Activity Tolerance: Patient tolerated treatment well Patient left: in chair;with call bell/phone within reach;with chair alarm set     Time: [id number] PT Time Calculation (min) (ACUTE ONLY): 36 min  Charges:  $Gait Training: 8-22 mins $Therapeutic Activity: 8-22 mins                    G Codes:      Rica Koyanagi  PTA [facility]  Acute  Rehab Pager  [phone]  

## 2015-11-09 NOTE — Progress Notes (Signed)
   Subjective: 2 Days Post-Op Procedure(s) (LRB): RIGHT TOTAL KNEE ARTHROPLASTY (Right) Patient reports pain as mild.   Patient seen in rounds for Dr. Wynelle Link. Patient is well, but has had some minor complaints of pain in the knee, requiring pain medications Patient is ready to go home  Objective: Vital signs in last 24 hours: Temp:  [98.5 F (36.9 C)-98.7 F (37.1 C)] 98.7 F (37.1 C) (09/20 0629) Pulse Rate:  [62-71] 71 (09/20 0629) Resp:  [16-17] 16 (09/20 0629) BP: (114-140)/(69-77) 139/69 (09/20 0629) SpO2:  [99 %-100 %] 100 % (09/20 0629)  Intake/Output from previous day:  Intake/Output Summary (Last 24 hours) at 11/09/15 0802 Last data filed at 11/09/15 0629  Gross per 24 hour  Intake          1216.67 ml  Output             1700 ml  Net          -483.33 ml    Intake/Output this shift: No intake/output data recorded.  Labs:  Recent Labs  11/08/15 0501 11/09/15 0450  HGB 13.6 12.5*    Recent Labs  11/08/15 0501 11/09/15 0450  WBC 12.4* 9.2  RBC 4.46 4.05*  HCT 38.5* 36.0*  PLT 122* 116*    Recent Labs  11/08/15 0501 11/09/15 0450  NA 137 137  K 4.4 3.9  CL 104 101  CO2 27 29  BUN 17 12  CREATININE 0.97 0.84  GLUCOSE 137* 116*  CALCIUM 8.7* 8.8*    Recent Labs  11/08/15 0501 11/09/15 0450  INR 1.04 1.32    EXAM: General - Patient is Alert, Appropriate and Oriented Extremity - Neurovascular intact Sensation intact distally Incision - clean, dry, no drainage Motor Function - intact, moving foot and toes well on exam.   Assessment/Plan: 2 Days Post-Op Procedure(s) (LRB): RIGHT TOTAL KNEE ARTHROPLASTY (Right) Procedure(s) (LRB): RIGHT TOTAL KNEE ARTHROPLASTY (Right) Past Medical History:  Diagnosis Date  . Arthritis   . Baker's cyst    right leg   . BPH (benign prostatic hypertrophy)   . Cancer ([place])    basal cell carcinoma - abd  . Chronic anticoagulation   . Endocarditis   . Family history of hiatal hernia   . Headache      migraines  . Heart murmur   . Hemorrhoids   . Hyperlipidemia   . MVP (mitral valve prolapse)   . PONV (postoperative nausea and vomiting)    with dizziness   . Proctitis   . S/P MVR (mitral valve replacement)   . Tinnitus   . Urinary frequency   . Weak urinary stream    Principal Problem:   Primary osteoarthritis of right knee Active Problems:   OA (osteoarthritis) of knee  Estimated body mass index is 24.82 kg/m as calculated from the following:   Height as of this encounter: 6' (1.829 m).   Weight as of this encounter: 83 kg (183 lb). Up with therapy Discharge home with home health Diet - Cardiac diet Follow up - in 2 weeks Activity - WBAT Disposition - Home Condition Upon Discharge - Good D/C Meds - See DC Summary DVT Prophylaxis - Lovenox and Coumadin  Zachary Muslim, PA-C Orthopaedic Surgery 11/09/2015, 8:02 AM

## 2015-11-09 NOTE — Progress Notes (Signed)
OT Cancellation Note  Patient Details Name: Zachary Calderon MRN: [id number] DOB: 05-01-1955   Cancelled Treatment:    Reason Eval/Treat Not Completed: Other (comment) Pt  Verbalized understanding of safety with ADL activity but declined practice at this time Kari Baars, [place]  Payton Mccallum D 11/09/2015, 11:20 AM

## 2015-11-09 NOTE — Progress Notes (Signed)
ANTICOAGULATION CONSULT NOTE - Follow-Up  Pharmacy Consult for warfarin  Indication: VTE prophylaxis  Allergies  Allergen Reactions  . Cephalosporins Hives and Itching    Patient Measurements: Height: 6' (182.9 cm) Weight: 183 lb (83 kg) IBW/kg (Calculated) : 77.6 Heparin Dosing Weight:   Vital Signs: Temp: 98.7 F (37.1 C) (09/20 0629) Temp Source: Oral (09/20 0629) BP: 139/69 (09/20 0629) Pulse Rate: 71 (09/20 0629)  Labs:  Recent Labs  11/07/15 0800 11/08/15 0501 11/09/15 0450  HGB  --  13.6 12.5*  HCT  --  38.5* 36.0*  PLT  --  122* 116*  LABPROT 13.5 13.6 16.4*  INR 1.03 1.04 1.32  CREATININE  --  0.97 0.84    Estimated Creatinine Clearance: 102.6 mL/min (by C-G formula based on SCr of 0.84 mg/dL).   Medical History: Past Medical History:  Diagnosis Date  . Arthritis   . Baker's cyst    right leg   . BPH (benign prostatic hypertrophy)   . Cancer ([place])    basal cell carcinoma - abd  . Chronic anticoagulation   . Endocarditis   . Family history of hiatal hernia   . Headache    migraines  . Heart murmur   . Hemorrhoids   . Hyperlipidemia   . MVP (mitral valve prolapse)   . PONV (postoperative nausea and vomiting)    with dizziness   . Proctitis   . S/P MVR (mitral valve replacement)   . Tinnitus   . Urinary frequency   . Weak urinary stream     Medications:  Scheduled:  . docusate sodium  100 mg Oral BID  . dutasteride  0.5 mg Oral Daily  . enoxaparin (LOVENOX) injection  30 mg Subcutaneous Q12H  . tamsulosin  0.8 mg Oral QPM  . Warfarin - Pharmacist Dosing Inpatient   Does not apply q1800    Assessment: Pharmacy is consulted to dose warfarin in 60 yo male with PMH of mitral valve replacement secondary to endocarditis s/p right TKA.  Pt has been off warfarin since 9/12 and has been injecting enoxaparin 30 mg SQ q12h from 9/12 until 9/17.  Pt home regimen was  Warfarin 8 mg PO daily.    Today, 11/09/15  INR  1.32, PT 16.4, INR  steadily increased  Hgb slightly decreased but pt is post-op. Plt low, will monitor  Continue enoxaparin 30 mg SQ q12h on 9/19. Order states to dc when INR is greater than 1.8  Clarified with PCP office that target INR is 2.5 to 3.5  Goal of Therapy:  Monitor platelets by anticoagulation protocol: Yes   Plan:   Warfarin 10 mg PO x1   CBC, INR ordered with AM labs  Monitor for signs/symptoms of bleeding  Royetta Asal, PharmD, BCPS Pager [phone] 11/09/2015 11:06 AM

## 2015-11-11 ENCOUNTER — Encounter: Payer: Self-pay | Admitting: Sports Medicine

## 2015-11-14 ENCOUNTER — Encounter: Payer: Self-pay | Admitting: Sports Medicine

## 2015-11-21 ENCOUNTER — Encounter: Payer: Self-pay | Admitting: Sports Medicine

## 2015-11-21 ENCOUNTER — Telehealth: Payer: Self-pay

## 2015-11-21 NOTE — Telephone Encounter (Signed)
Pt notified and will keep [place] posted on upcoming INR and will come in to clinic when pain subsides from recent knee replacement.

## 2015-11-21 NOTE — Telephone Encounter (Signed)
Patient needs to return here for an INR check, hold off on all Coumadin until then.

## 2015-11-21 NOTE — Telephone Encounter (Signed)
Pt recently had his knee replacement and his recent INR was 7.3. Would like for you to give him a call to discuss a plan. Please assist.

## 2015-11-22 ENCOUNTER — Encounter: Payer: Self-pay | Admitting: Sports Medicine

## 2015-11-27 ENCOUNTER — Other Ambulatory Visit: Payer: Self-pay | Admitting: Sports Medicine

## 2015-11-29 MED ORDER — WARFARIN SODIUM 5 MG PO TABS: 5.0000 mg | ORAL_TABLET | ORAL | 0 refills | Status: DC

## 2015-11-29 MED ORDER — WARFARIN SODIUM 1 MG PO TABS: 1.0000 mg | ORAL_TABLET | ORAL | 0 refills | Status: DC

## 2015-12-01 ENCOUNTER — Encounter: Payer: Self-pay | Admitting: Sports Medicine

## 2015-12-05 ENCOUNTER — Ambulatory Visit (INDEPENDENT_AMBULATORY_CARE_PROVIDER_SITE_OTHER): Payer: BLUE CROSS/BLUE SHIELD | Admitting: Sports Medicine

## 2015-12-05 ENCOUNTER — Encounter: Payer: Self-pay | Admitting: Sports Medicine

## 2015-12-05 DIAGNOSIS — Z952 Presence of prosthetic heart valve: Secondary | ICD-10-CM | POA: Diagnosis not present

## 2015-12-05 LAB — POCT INR: INR: 5.2

## 2015-12-05 NOTE — Patient Instructions (Signed)
Pre Dr. Darene Lamer pt is to take 4 mg tomorrow then resume back to 8 mg daily. Return to clinic in 1 week for recheck.

## 2015-12-06 ENCOUNTER — Encounter: Payer: Self-pay | Admitting: Sports Medicine

## 2015-12-06 ENCOUNTER — Ambulatory Visit: Payer: BLUE CROSS/BLUE SHIELD | Admitting: Sports Medicine

## 2015-12-07 ENCOUNTER — Encounter: Payer: Self-pay | Admitting: Sports Medicine

## 2015-12-07 ENCOUNTER — Ambulatory Visit: Payer: BLUE CROSS/BLUE SHIELD

## 2015-12-13 ENCOUNTER — Encounter: Payer: Self-pay | Admitting: Sports Medicine

## 2015-12-13 ENCOUNTER — Ambulatory Visit (INDEPENDENT_AMBULATORY_CARE_PROVIDER_SITE_OTHER): Payer: BLUE CROSS/BLUE SHIELD | Admitting: Sports Medicine

## 2015-12-13 VITALS — BP 126/72 | HR 103 | Wt 176.0 lb

## 2015-12-13 DIAGNOSIS — R8299 Other abnormal findings in urine: Secondary | ICD-10-CM | POA: Diagnosis not present

## 2015-12-13 DIAGNOSIS — N401 Enlarged prostate with lower urinary tract symptoms: Secondary | ICD-10-CM | POA: Diagnosis not present

## 2015-12-13 DIAGNOSIS — R319 Hematuria, unspecified: Secondary | ICD-10-CM | POA: Diagnosis not present

## 2015-12-13 DIAGNOSIS — Z952 Presence of prosthetic heart valve: Secondary | ICD-10-CM | POA: Diagnosis not present

## 2015-12-13 DIAGNOSIS — R82998 Other abnormal findings in urine: Secondary | ICD-10-CM

## 2015-12-13 DIAGNOSIS — Z7901 Long term (current) use of anticoagulants: Secondary | ICD-10-CM

## 2015-12-13 DIAGNOSIS — Z96651 Presence of right artificial knee joint: Secondary | ICD-10-CM

## 2015-12-13 DIAGNOSIS — R350 Frequency of micturition: Secondary | ICD-10-CM

## 2015-12-13 LAB — POCT URINALYSIS DIPSTICK
Bilirubin, UA: NEGATIVE
Glucose, UA: NEGATIVE
Ketones, UA: NEGATIVE
Nitrite, UA: NEGATIVE
Protein, UA: NEGATIVE
Spec Grav, UA: 1.015
Urobilinogen, UA: 0.2
pH, UA: 6

## 2015-12-13 LAB — POCT INR: INR: 4.5

## 2015-12-13 MED ORDER — LEVOFLOXACIN 750 MG PO TABS: 750.0000 mg | ORAL_TABLET | Freq: Every day | ORAL | 0 refills | Status: DC

## 2015-12-13 NOTE — Assessment & Plan Note (Signed)
Leukocytes in the urine, adding Levaquin for 14 days. Urine culture. This likely represents prostatitis.  He did have some hematuria which will likely resolve with treatment of his prostatitis as well as normalization of his INR.

## 2015-12-13 NOTE — Assessment & Plan Note (Signed)
I suspect his recurrent hemarthrosis is due to being supratherapeutic.

## 2015-12-13 NOTE — Progress Notes (Signed)
  Subjective:    CC: Follow-up  HPI: Anticoagulation: INR is still supratherapeutic, he is having a bit of urinary frequency, no overt gross hematuria.  Knee arthroplasty: Overall doing well, had 50 mL of blood drained out by his orthopedist, and has another appointment today.  Past medical history:  Negative.  See flowsheet/record as well for more information.  Surgical history: Negative.  See flowsheet/record as well for more information.  Family history: Negative.  See flowsheet/record as well for more information.  Social history: Negative.  See flowsheet/record as well for more information.  Allergies, and medications have been entered into the medical record, reviewed, and no changes needed.   Review of Systems: No fevers, chills, night sweats, weight loss, chest pain, or shortness of breath.   Objective:    General: Well Developed, well nourished, and in no acute distress.  Neuro: Alert and oriented x3, extra-ocular muscles intact, sensation grossly intact.  HEENT: Normocephalic, atraumatic, pupils equal round reactive to light, neck supple, no masses, no lymphadenopathy, thyroid nonpalpable.  Skin: Warm and dry, no rashes. Cardiac: Regular rate and rhythm, no murmurs rubs or gallops, no lower extremity edema.  Respiratory: Clear to auscultation bilaterally. Not using accessory muscles, speaking in full sentences. Abdomen: Soft, nontender, nondistended, normal bowel sounds, no palpable masses, no guarding, rigidity, rebound tenderness.  Urinalysis is positive for blood and leukocytes.  Impression and Recommendations:    BPH (benign prostatic hyperplasia) Leukocytes in the urine, adding Levaquin for 14 days. Urine culture. This likely represents prostatitis.  He did have some hematuria which will likely resolve with treatment of his prostatitis as well as normalization of his INR.  Chronic anticoagulation Decreasing Coumadin dose to 4 mg on Tuesday and Friday, 8 mg other  days, return in one week. Goal INR is 2.5-3.5.  History of arthroplasty of right knee I suspect his recurrent hemarthrosis is due to being supratherapeutic.

## 2015-12-13 NOTE — Assessment & Plan Note (Signed)
Decreasing Coumadin dose to 4 mg on Tuesday and Friday, 8 mg other days, return in one week. Goal INR is 2.5-3.5.

## 2015-12-14 ENCOUNTER — Ambulatory Visit: Payer: BLUE CROSS/BLUE SHIELD

## 2015-12-15 LAB — URINE CULTURE: Organism ID, Bacteria: NO GROWTH

## 2015-12-20 ENCOUNTER — Ambulatory Visit (INDEPENDENT_AMBULATORY_CARE_PROVIDER_SITE_OTHER): Payer: BLUE CROSS/BLUE SHIELD | Admitting: Sports Medicine

## 2015-12-20 ENCOUNTER — Telehealth: Payer: Self-pay

## 2015-12-20 VITALS — BP 118/72 | HR 99

## 2015-12-20 DIAGNOSIS — Z23 Encounter for immunization: Secondary | ICD-10-CM | POA: Diagnosis not present

## 2015-12-20 DIAGNOSIS — Z952 Presence of prosthetic heart valve: Secondary | ICD-10-CM

## 2015-12-20 LAB — POCT INR: INR: 3.7

## 2015-12-20 NOTE — Progress Notes (Signed)
8 mg daily except 4mg  on tuesdays and fridays. Numbers are decreasing steadily, return in one week. No changes yet.

## 2015-12-20 NOTE — Progress Notes (Signed)
Left message advising of recommendations.  

## 2015-12-20 NOTE — Telephone Encounter (Signed)
FYI    I called [employer] and spoke with JanD, the reference number for the call is [id number]. [employer] with cover CPT [phone], Cologuard, 100 % after deductible has been meet. Patient advised and will sign order form at his next visit.

## 2015-12-20 NOTE — Progress Notes (Signed)
Patient aware of recommendations.  

## 2015-12-29 ENCOUNTER — Encounter: Payer: Self-pay | Admitting: Sports Medicine

## 2015-12-30 ENCOUNTER — Ambulatory Visit: Payer: BLUE CROSS/BLUE SHIELD

## 2016-01-02 ENCOUNTER — Encounter: Payer: Self-pay | Admitting: Sports Medicine

## 2016-01-02 ENCOUNTER — Ambulatory Visit (INDEPENDENT_AMBULATORY_CARE_PROVIDER_SITE_OTHER): Payer: BLUE CROSS/BLUE SHIELD | Admitting: Sports Medicine

## 2016-01-02 VITALS — BP 126/84 | HR 105 | Resp 18 | Wt 174.2 lb

## 2016-01-02 DIAGNOSIS — Z7901 Long term (current) use of anticoagulants: Secondary | ICD-10-CM | POA: Diagnosis not present

## 2016-01-02 DIAGNOSIS — M25561 Pain in right knee: Secondary | ICD-10-CM | POA: Diagnosis not present

## 2016-01-02 DIAGNOSIS — Z96651 Presence of right artificial knee joint: Secondary | ICD-10-CM

## 2016-01-02 LAB — POCT INR: INR: 2

## 2016-01-02 NOTE — Assessment & Plan Note (Signed)
Attempted aspiration of hemarthrosis. Unable to aspirate anything, injected. INR is down to 2. Patient will need to continue typical dosing regimen, and return to see [place] in one week for an INR check unless he has his home INR meter by then.

## 2016-01-02 NOTE — Progress Notes (Signed)
  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 He did have an aspiration once, he has also been fairly supratherapeutic with his INR. He is here today with swelling of the knee, and wonders if we can aspirate it. Pain is mild, persistent, principal issue is lack of range of motion, no fevers, chills, night sweats, weight loss.  Past medical history:  Negative.  See flowsheet/record as well for more information.  Surgical history: Negative.  See flowsheet/record as well for more information.  Family history: Negative.  See flowsheet/record as well for more information.  Social history: Negative.  See flowsheet/record as well for more information.  Allergies, and medications have been entered into the medical record, reviewed, and no changes needed.   Review of Systems: No fevers, chills, night sweats, weight loss, chest pain, or shortness of breath.   Objective:    General: Well Developed, well nourished, and in no acute distress.  Neuro: Alert and oriented x3, extra-ocular muscles intact, sensation grossly intact.  HEENT: Normocephalic, atraumatic, pupils equal round reactive to light, neck supple, no masses, no lymphadenopathy, thyroid nonpalpable.  Skin: Warm and dry, no rashes. Cardiac: Regular rate and rhythm, no murmurs rubs or gallops, no lower extremity edema.  Respiratory: Clear to auscultation bilaterally. Not using accessory muscles, speaking in full sentences. Right Knee: Well-healed arthroplasty scar, knee is swollen, it is not warm or erythematous. ROM normal in flexion and extension and lower leg rotation. Ligaments with solid consistent endpoints including ACL, PCL, LCL, MCL. Negative Mcmurray's and provocative meniscal tests. Non painful patellar compression. Patellar and quadriceps tendons unremarkable. Hamstring and  quadriceps strength is normal.  Procedure: Real-time Ultrasound Guided Injection of right knee Device: GE Logiq E  Verbal informed consent obtained.  Time-out conducted.  Noted no overlying erythema, induration, or other signs of local infection.  Skin prepped in a sterile fashion.  Local anesthesia: Topical Ethyl chloride.  With sterile technique and under real time ultrasound guidance:  Attempted aspiration but ultrasound showed mostly organized hematoma, I aborted the aspiration after being unable to aspirate any blood and we injected the knee with 1 mL kenalog 40 Completed without difficulty  Pain immediately resolved suggesting accurate placement of the medication.  Advised to call if fevers/chills, erythema, induration, drainage, or persistent bleeding.  Images permanently stored and available for review in the ultrasound unit.  Impression: Technically successful ultrasound guided injection.  The knee was then strapped with compressive dressing.  Impression and Recommendations:    History of arthroplasty of right knee Attempted aspiration of hemarthrosis. Unable to aspirate anything, injected. INR is down to 2. Patient will need to continue typical dosing regimen, and return to see [place] in one week for an INR check unless he has his home INR meter by then.

## 2016-01-06 ENCOUNTER — Other Ambulatory Visit: Payer: Self-pay | Admitting: Sports Medicine

## 2016-01-20 ENCOUNTER — Encounter: Payer: Self-pay | Admitting: Sports Medicine

## 2016-01-20 ENCOUNTER — Ambulatory Visit (INDEPENDENT_AMBULATORY_CARE_PROVIDER_SITE_OTHER): Payer: BLUE CROSS/BLUE SHIELD | Admitting: Sports Medicine

## 2016-01-20 DIAGNOSIS — K644 Residual hemorrhoidal skin tags: Secondary | ICD-10-CM | POA: Insufficient documentation

## 2016-01-20 DIAGNOSIS — E7800 Pure hypercholesterolemia, unspecified: Secondary | ICD-10-CM

## 2016-01-20 DIAGNOSIS — Z7901 Long term (current) use of anticoagulants: Secondary | ICD-10-CM

## 2016-01-20 DIAGNOSIS — N401 Enlarged prostate with lower urinary tract symptoms: Secondary | ICD-10-CM | POA: Diagnosis not present

## 2016-01-20 DIAGNOSIS — R350 Frequency of micturition: Secondary | ICD-10-CM

## 2016-01-20 LAB — COMPREHENSIVE METABOLIC PANEL WITH GFR
ALT: 15 U/L (ref 9–46)
Alkaline Phosphatase: 49 U/L (ref 40–115)
BUN: 14 mg/dL (ref 7–25)
CO2: 31 mmol/L (ref 20–31)
Calcium: 9.2 mg/dL (ref 8.6–10.3)
Chloride: 105 mmol/L (ref 98–110)
Glucose, Bld: 88 mg/dL (ref 65–99)
Potassium: 4.5 mmol/L (ref 3.5–5.3)

## 2016-01-20 LAB — COMPREHENSIVE METABOLIC PANEL
AST: 18 U/L (ref 10–35)
Albumin: 4.1 g/dL (ref 3.6–5.1)
Creat: 0.91 mg/dL (ref 0.70–1.25)
Sodium: 140 mmol/L (ref 135–146)
Total Bilirubin: 1.1 mg/dL (ref 0.2–1.2)
Total Protein: 6.2 g/dL (ref 6.1–8.1)

## 2016-01-20 LAB — LIPID PANEL
Cholesterol: 264 mg/dL — ABNORMAL HIGH (ref <200)
HDL: 58 mg/dL (ref >40)
LDL Cholesterol: 190 mg/dL — ABNORMAL HIGH (ref <100)
Total CHOL/HDL Ratio: 4.6 ratio (ref <5.0)
Triglycerides: 82 mg/dL (ref <150)
VLDL: 16 mg/dL (ref <30)

## 2016-01-20 MED ORDER — HYDROCORTISONE ACETATE 25 MG RE SUPP: 25.0000 mg | Freq: Two times a day (BID) | RECTAL | 2 refills | Status: DC | PRN

## 2016-01-20 MED ORDER — WITCH HAZEL-GLYCERIN EX PADS: 1.0000 "application " | MEDICATED_PAD | CUTANEOUS | 11 refills | Status: DC | PRN

## 2016-01-20 NOTE — Assessment & Plan Note (Signed)
Rechecking lipids. If still elevated we will consider statin therapy

## 2016-01-20 NOTE — Assessment & Plan Note (Signed)
Coumadin dose stasis and, rechecking INR, he has not yet gotten his home INR unit.

## 2016-01-20 NOTE — Assessment & Plan Note (Signed)
Stools already soft, adding Anusol suppositories and witch hazel wipes.

## 2016-01-20 NOTE — Progress Notes (Signed)
  Subjective:    CC: Follow-up  HPI: Prostatitis: Symptoms have improved after Levaquin. He did have some hematuria and understands we need to recheck.  Artificial heart valve: Due for INR check.  Hemorrhoids: Present for a few months now, really doesn't have hard stools but he has been stooling often, and this has irritated his hemorrhoid.  Hyperlipidemia: Due for recheck.  Past medical history:  Negative.  See flowsheet/record as well for more information.  Surgical history: Negative.  See flowsheet/record as well for more information.  Family history: Negative.  See flowsheet/record as well for more information.  Social history: Negative.  See flowsheet/record as well for more information.  Allergies, and medications have been entered into the medical record, reviewed, and no changes needed.   Review of Systems: No fevers, chills, night sweats, weight loss, chest pain, or shortness of breath.   Objective:    General: Well Developed, well nourished, and in no acute distress.  Neuro: Alert and oriented x3, extra-ocular muscles intact, sensation grossly intact.  HEENT: Normocephalic, atraumatic, pupils equal round reactive to light, neck supple, no masses, no lymphadenopathy, thyroid nonpalpable.  Skin: Warm and dry, no rashes. Cardiac: Regular rate and rhythm, no murmurs rubs or gallops, no lower extremity edema.  Respiratory: Clear to auscultation bilaterally. Not using accessory muscles, speaking in full sentences.  Impression and Recommendations:    Hyperlipidemia Rechecking lipids. If still elevated we will consider statin therapy  Chronic anticoagulation Coumadin dose stasis and, rechecking INR, he has not yet gotten his home INR unit.  BPH (benign prostatic hyperplasia) Improved after 14 days of Levaquin. He did have hematuria, we are going to check another urinalysis to ensure this has cleared. Diagnosis was prostatitis.  External hemorrhoid Stools already soft,  adding Anusol suppositories and witch hazel wipes.

## 2016-01-20 NOTE — Assessment & Plan Note (Signed)
Improved after 14 days of Levaquin. He did have hematuria, we are going to check another urinalysis to ensure this has cleared. Diagnosis was prostatitis.

## 2016-01-21 LAB — URINALYSIS
Bilirubin Urine: NEGATIVE
Glucose, UA: NEGATIVE
Ketones, ur: NEGATIVE
Leukocytes, UA: NEGATIVE
Nitrite: NEGATIVE
Protein, ur: NEGATIVE
Specific Gravity, Urine: 1.023 (ref 1.001–1.035)
pH: 5.5 (ref 5.0–8.0)

## 2016-01-21 LAB — PROTIME-INR
INR: 2 — ABNORMAL HIGH
Prothrombin Time: 20.5 s — ABNORMAL HIGH (ref 9.0–11.5)

## 2016-01-21 LAB — POCT INR
INR: 2.8
INR: 2.8 — AB (ref 0.9–1.1)

## 2016-01-22 ENCOUNTER — Encounter: Payer: Self-pay | Admitting: Sports Medicine

## 2016-01-23 NOTE — Telephone Encounter (Signed)
Please look into ColoGuard for this patient, he is also going to make a nurse visit for the new shingles vaccine

## 2016-01-23 NOTE — Telephone Encounter (Signed)
Spoke with Pt, he will come in tomorrow to sign order for Cologuard.

## 2016-01-26 ENCOUNTER — Other Ambulatory Visit: Payer: Self-pay | Admitting: Sports Medicine

## 2016-01-30 ENCOUNTER — Ambulatory Visit (INDEPENDENT_AMBULATORY_CARE_PROVIDER_SITE_OTHER): Payer: BLUE CROSS/BLUE SHIELD

## 2016-01-30 ENCOUNTER — Encounter: Payer: Self-pay | Admitting: Sports Medicine

## 2016-01-30 ENCOUNTER — Ambulatory Visit (INDEPENDENT_AMBULATORY_CARE_PROVIDER_SITE_OTHER): Payer: BLUE CROSS/BLUE SHIELD | Admitting: Sports Medicine

## 2016-01-30 DIAGNOSIS — J189 Pneumonia, unspecified organism: Secondary | ICD-10-CM

## 2016-01-30 DIAGNOSIS — N401 Enlarged prostate with lower urinary tract symptoms: Secondary | ICD-10-CM | POA: Diagnosis not present

## 2016-01-30 DIAGNOSIS — J918 Pleural effusion in other conditions classified elsewhere: Secondary | ICD-10-CM | POA: Diagnosis not present

## 2016-01-30 DIAGNOSIS — R059 Cough, unspecified: Secondary | ICD-10-CM

## 2016-01-30 DIAGNOSIS — R05 Cough: Secondary | ICD-10-CM

## 2016-01-30 DIAGNOSIS — Y95 Nosocomial condition: Secondary | ICD-10-CM | POA: Insufficient documentation

## 2016-01-30 DIAGNOSIS — R350 Frequency of micturition: Secondary | ICD-10-CM | POA: Diagnosis not present

## 2016-01-30 DIAGNOSIS — K644 Residual hemorrhoidal skin tags: Secondary | ICD-10-CM

## 2016-01-30 MED ORDER — BENZONATATE 200 MG PO CAPS: 200.0000 mg | ORAL_CAPSULE | Freq: Three times a day (TID) | ORAL | 0 refills | Status: DC | PRN

## 2016-01-30 MED ORDER — MOXIFLOXACIN HCL 400 MG PO TABS: 400.0000 mg | ORAL_TABLET | Freq: Every day | ORAL | 0 refills | Status: DC

## 2016-01-30 NOTE — Assessment & Plan Note (Signed)
Resolved

## 2016-01-30 NOTE — Assessment & Plan Note (Signed)
Hematuria on the last check. Now that we have finished Levaquin we are doing to recheck his urinalysis.

## 2016-01-30 NOTE — Addendum Note (Signed)
Addended by: Silverio Decamp on: 01/30/2016 11:24 AM   Modules accepted: Orders

## 2016-01-30 NOTE — Progress Notes (Signed)
  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 This has since resolved and his cough has persisted. No fevers, chills, night sweats, weight loss.  Hematuria: Suspected prostatitis, symptoms resolved with completing a ten-day course of Levaquin however we have not rechecked his urine yet. Previous urinalysis did show hematuria.  Hemorrhoids: Resolved with conservative measures  Past medical history:  Negative.  See flowsheet/record as well for more information.  Surgical history: Negative.  See flowsheet/record as well for more information.  Family history: Negative.  See flowsheet/record as well for more information.  Social history: Negative.  See flowsheet/record as well for more information.  Allergies, and medications have been entered into the medical record, reviewed, and no changes needed.   Review of Systems: No fevers, chills, night sweats, weight loss, chest pain, or shortness of breath.   Objective:    General: Well Developed, well nourished, and in no acute distress.  Neuro: Alert and oriented x3, extra-ocular muscles intact, sensation grossly intact.  HEENT: Normocephalic, atraumatic, pupils equal round reactive to light, neck supple, no masses, no lymphadenopathy, thyroid nonpalpable.  Skin: Warm and dry, no rashes. Cardiac: Regular rate and rhythm, no murmurs rubs or gallops, no lower extremity edema.  Respiratory: Somewhat coarse breath sounds at the right middle lobe. Not using accessory muscles, speaking in full sentences.  Impression and Recommendations:    Coughing Present for one week now, likely postinfectious cough. Coarse sounds in the right middle lobe. Chest x-ray, Tessalon Perles.   External hemorrhoid Resolved  BPH (benign  prostatic hyperplasia) Hematuria on the last check. Now that we have finished Levaquin we are doing to recheck his urinalysis.

## 2016-01-30 NOTE — Assessment & Plan Note (Addendum)
Present for one week now, likely postinfectious cough. Coarse sounds in the right middle lobe. Chest x-ray, Tessalon Perles.  Chest x-ray shows a right middle/right lower lobe infiltrate consistent with pneumonia, he has been in the hospital within the past 3 months for his knee arthroplasty, so we are going to treat this as a healthcare associated pneumonia with Avelox 400 daily for 10 days.

## 2016-02-01 ENCOUNTER — Encounter: Payer: Self-pay | Admitting: Sports Medicine

## 2016-02-02 ENCOUNTER — Encounter: Payer: Self-pay | Admitting: Sports Medicine

## 2016-02-06 ENCOUNTER — Ambulatory Visit: Payer: BLUE CROSS/BLUE SHIELD | Admitting: Sports Medicine

## 2016-02-08 ENCOUNTER — Encounter (HOSPITAL_COMMUNITY): Payer: Self-pay | Admitting: Neurology

## 2016-02-08 ENCOUNTER — Telehealth: Payer: Self-pay

## 2016-02-08 ENCOUNTER — Emergency Department (HOSPITAL_COMMUNITY)
Admission: EM | Admit: 2016-02-08 | Discharge: 2016-02-08 | Disposition: A | Discharge status: Home / Self Care | Payer: BLUE CROSS/BLUE SHIELD | Attending: Emergency Medicine | Admitting: Emergency Medicine

## 2016-02-08 ENCOUNTER — Ambulatory Visit (INDEPENDENT_AMBULATORY_CARE_PROVIDER_SITE_OTHER): Payer: BLUE CROSS/BLUE SHIELD | Admitting: Sports Medicine

## 2016-02-08 ENCOUNTER — Encounter: Payer: Self-pay | Admitting: Sports Medicine

## 2016-02-08 DIAGNOSIS — R3129 Other microscopic hematuria: Secondary | ICD-10-CM

## 2016-02-08 DIAGNOSIS — R791 Abnormal coagulation profile: Secondary | ICD-10-CM | POA: Diagnosis not present

## 2016-02-08 DIAGNOSIS — N323 Diverticulum of bladder: Secondary | ICD-10-CM | POA: Insufficient documentation

## 2016-02-08 DIAGNOSIS — R799 Abnormal finding of blood chemistry, unspecified: Secondary | ICD-10-CM | POA: Diagnosis present

## 2016-02-08 DIAGNOSIS — J189 Pneumonia, unspecified organism: Secondary | ICD-10-CM

## 2016-02-08 DIAGNOSIS — R3121 Asymptomatic microscopic hematuria: Secondary | ICD-10-CM | POA: Diagnosis not present

## 2016-02-08 DIAGNOSIS — R319 Hematuria, unspecified: Secondary | ICD-10-CM | POA: Diagnosis not present

## 2016-02-08 DIAGNOSIS — Y95 Nosocomial condition: Principal | ICD-10-CM

## 2016-02-08 LAB — POCT INR: INR: 8.5 — AB (ref 0.9–1.1)

## 2016-02-08 LAB — PROTIME-INR
INR: 4.37
PROTHROMBIN TIME: 42.9 s — AB (ref 11.4–15.2)

## 2016-02-08 LAB — POCT URINALYSIS DIPSTICK
Bilirubin, UA: NEGATIVE
Glucose, UA: NEGATIVE
Ketones, UA: NEGATIVE
Leukocytes, UA: NEGATIVE
Nitrite, UA: NEGATIVE
Protein, UA: NEGATIVE
Spec Grav, UA: 1.02
Urobilinogen, UA: 0.2
pH, UA: 6

## 2016-02-08 LAB — URINALYSIS, ROUTINE W REFLEX MICROSCOPIC
BILIRUBIN URINE: NEGATIVE
Bacteria, UA: NONE SEEN
GLUCOSE, UA: NEGATIVE mg/dL
KETONES UR: NEGATIVE mg/dL
LEUKOCYTES UA: NEGATIVE
Nitrite: NEGATIVE
PROTEIN: NEGATIVE mg/dL
Specific Gravity, Urine: 1.013 (ref 1.005–1.030)
pH: 6 (ref 5.0–8.0)

## 2016-02-08 LAB — CBC
HCT: 41.9 % (ref 39.0–52.0)
Hemoglobin: 14 g/dL (ref 13.0–17.0)
MCH: 28.4 pg (ref 26.0–34.0)
MCHC: 33.4 g/dL (ref 30.0–36.0)
MCV: 85 fL (ref 78.0–100.0)
PLATELETS: 326 10*3/uL (ref 150–400)
RBC: 4.93 MIL/uL (ref 4.22–5.81)
RDW: 13.4 % (ref 11.5–15.5)
WBC: 7.5 10*3/uL (ref 4.0–10.5)

## 2016-02-08 LAB — COLOGUARD: COLOGUARD: NEGATIVE

## 2016-02-08 NOTE — ED Provider Notes (Signed)
[facility] DEPT Provider Note   CSN: [id number] Arrival date & time: 02/08/16  1535     History   Chief Complaint Chief Complaint  Patient presents with  . Abnormal Lab    HPI Zachary Calderon is a 60 y.o. male.  HPI Patient is on Coumadin since mechanical mitral valve replacement in 2000. States he checked his INR at home and the reading came back greater than 8. He's had intermittent gross hematuria. He states is typically on starting to urinate and quickly clears. Denies any burning, frequency or urgency. Denies any flank pain. Denies any blood in cough or stool. Recently treated for pneumonia and is finishing a course of moxifloxacin. No fever or chills. Denies headaches, vision changes, weakness or numbness. Past Medical History:  Diagnosis Date  . Arthritis   . Baker's cyst    right leg   . BPH (benign prostatic hypertrophy)   . Cancer ([place])    basal cell carcinoma - abd  . Chronic anticoagulation   . Endocarditis   . Family history of hiatal hernia   . Headache    migraines  . Heart murmur   . Hemorrhoids   . Hyperlipidemia   . MVP (mitral valve prolapse)   . PONV (postoperative nausea and vomiting)    with dizziness   . Proctitis   . S/P MVR (mitral valve replacement)   . Tinnitus   . Urinary frequency   . Weak urinary stream     Patient Active Problem List   Diagnosis Date Noted  . Hematuria 02/08/2016  . HAP (hospital-acquired pneumonia) 01/30/2016  . External hemorrhoid 01/20/2016  . History of arthroplasty of right knee 11/07/2015  . Migraine with aura 07/22/2015  . Primary osteoarthritis of right knee 06/24/2015  . BPH (benign prostatic hyperplasia) 06/24/2015  . Preventive measure 06/24/2015  . S/P MVR (mitral valve replacement)   . Endocarditis   . Hyperlipidemia   . Chronic anticoagulation     Past Surgical History:  Procedure Laterality Date  . APPENDECTOMY    . MITRAL VALVE REPAIR    . MITRAL VALVE REPLACEMENT    . right knee  arthroscopy     times 2  . TONSILLECTOMY    . TOTAL KNEE ARTHROPLASTY Right 11/07/2015   Procedure: RIGHT TOTAL KNEE ARTHROPLASTY;  Surgeon: Gaynelle Arabian, MD;  Location: [facility];  Service: Orthopedics;  Laterality: Right;       Home Medications    Prior to Admission medications   Medication Sig Start Date End Date Taking? Authorizing Provider  dutasteride (AVODART) 0.5 MG capsule Take 1 capsule (0.5 mg total) by mouth daily. 10/21/15  Yes Silverio Decamp, MD  Omega-3 Fatty Acids (FISH OIL) 1000 MG CAPS Take 1,000 mg by mouth 3 (three) times a week.   Yes Historical Provider, MD  rizatriptan (MAXALT-MLT) 10 MG disintegrating tablet Take 1 tablet (10 mg total) by mouth as needed for migraine. May repeat in 2 hours if needed 07/22/15  Yes Silverio Decamp, MD  tamsulosin (FLOMAX) 0.4 MG CAPS capsule Take 0.8 mg by mouth daily. 01/10/16  Yes Historical Provider, MD  Turmeric Curcumin 500 MG CAPS Take 500 mg by mouth 3 (three) times a week.   Yes Historical Provider, MD  warfarin (COUMADIN) 4 MG tablet Take 4-8 mg by mouth daily at 6 PM. Takes 4mg  on wed and sat  Takes 8mg  all other days   Yes Historical Provider, MD  warfarin (COUMADIN) 1 MG tablet Take 1 mg by mouth  daily. As directed after INR results. 01/06/16   Historical Provider, MD  warfarin (COUMADIN) 5 MG tablet Take 5 mg by mouth daily. As directed after INR results. 01/26/16   Historical Provider, MD    Family History Family History  Problem Relation Age of Onset  . Hypertension Mother   . Leukemia Mother     Social History Social History  Substance Use Topics  . Smoking status: Never Smoker  . Smokeless tobacco: Never Used  . Alcohol use Yes     Comment: 1 drink 5 times per week      Allergies   Benzonatate and Cephalosporins   Review of Systems Review of Systems  Constitutional: Negative for chills and fever.  HENT: Negative for nosebleeds.   Respiratory: Positive for cough. Negative for shortness of  breath.   Cardiovascular: Negative for chest pain.  Gastrointestinal: Negative for abdominal pain, blood in stool, constipation, diarrhea, nausea and vomiting.  Genitourinary: Positive for hematuria. Negative for difficulty urinating, dysuria, flank pain and frequency.  Musculoskeletal: Negative for back pain and neck pain.  Neurological: Negative for dizziness, weakness, light-headedness, numbness and headaches.  All other systems reviewed and are negative.    Physical Exam Updated Vital Signs BP 130/84   Pulse 71   Temp 97.9 F (36.6 C) (Oral)   Resp 16   Ht 6' (1.829 m)   Wt 176 lb (79.8 kg)   SpO2 100%   BMI 23.87 kg/m   Physical Exam  Constitutional: He is oriented to person, place, and time. He appears well-developed and well-nourished.  HENT:  Head: Normocephalic and atraumatic.  Mouth/Throat: Oropharynx is clear and moist.  Eyes: EOM are normal. Pupils are equal, round, and reactive to light.  Neck: Normal range of motion. Neck supple.  Cardiovascular: Normal rate and regular rhythm.   Pulmonary/Chest: Effort normal and breath sounds normal.  Abdominal: Soft. Bowel sounds are normal. There is no tenderness. There is no rebound and no guarding.  Musculoskeletal: Normal range of motion. He exhibits no edema or tenderness.  Neurological: He is alert and oriented to person, place, and time.  Moving all extremities without deficit. Sensation intact.  Skin: Skin is warm and dry. No rash noted. No erythema.  Psychiatric: He has a normal mood and affect. His behavior is normal.  Nursing note and vitals reviewed.    ED Treatments / Results  Labs (all labs ordered are listed, but only abnormal results are displayed) Labs Reviewed  PROTIME-INR - Abnormal; Notable for the following:       Result Value   Prothrombin Time 42.9 (*)    INR 4.37 (*)    All other components within normal limits  URINALYSIS, ROUTINE W REFLEX MICROSCOPIC - Abnormal; Notable for the following:      Hgb urine dipstick MODERATE (*)    Squamous Epithelial / LPF 0-5 (*)    All other components within normal limits  CBC    EKG  EKG Interpretation None       Radiology US Renal  Result Date: 02/09/2016 CLINICAL DATA:  Intermittent hematuria EXAM: RENAL / URINARY TRACT ULTRASOUND COMPLETE COMPARISON:  09/06/2005 FINDINGS: Right Kidney: Length: 10.8 cm. Echogenicity within normal limits. No mass or hydronephrosis visualized. Left Kidney: Length: 10.7 cm. Echogenicity within normal limits. No mass or hydronephrosis visualized. Bladder: Well distended.  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 Electronically Signed   By: Inez Catalina M.D.   On: 02/09/2016 10:06    Procedures Procedures (  including critical care time)  Medications Ordered in ED Medications - No data to display   Initial Impression / Assessment and Plan / ED Course  I have reviewed the triage vital signs and the nursing notes.  Pertinent labs & imaging results that were available during my care of the patient were reviewed by me and considered in my medical decision making (see chart for details).  Clinical Course    Repeat INR is 4.31. Patient has microscopic blood in urine. Advised to hold Coumadin 1 day and follow-up with primary physician tomorrow. Return precautions given   Final Clinical Impressions(s) / ED Diagnoses   Final diagnoses:  Elevated INR  Microscopic hematuria    New Prescriptions Discharge Medication List as of 02/08/2016  7:40 PM       Julianne Rice, MD 02/09/16 1821

## 2016-02-08 NOTE — Telephone Encounter (Signed)
Zachary Calderon is now checking his INR at home. Today his INR is 8.5. Dr Georgina Snell was notified. He stated patient needs to go to the ED. I called patient and advised him to go to the ED. Patient agreed. I abstracted the INR results into EPIC so the ED should be able to see the value.

## 2016-02-08 NOTE — Progress Notes (Signed)
Zachary Calderon recently started checking his INR at home with a home monitor. He reports his INR to be 8.5. See phone note.

## 2016-02-08 NOTE — Discharge Instructions (Signed)
Hold Coumadin until you are reevaluated by your primary physician tomorrow. Return immediately for abnormal bleeding, lightheadedness, weakness or any concerns.

## 2016-02-08 NOTE — ED Triage Notes (Signed)
Pt is taking coumadin and he checks his levels at home. Today it read > 8.0. Denies any changes in medications. Feels good. Recently dx with pneumonia. Over the weekend noticed a small amount of blood during initial urine stream.

## 2016-02-08 NOTE — Assessment & Plan Note (Signed)
Suspect that this is related to prostate stones, he has seen a urologist approximately 15-20 years ago, had a pyelogram, no diagnosis was made. This is only happened after he started anticoagulation for his artificial heart valve. We have done a few courses of fluoroquinolones, he continues to have hematuria so we will proceed with renal ultrasound, if unrevealing we will refer to urology for consideration of direct visualization with cystoscopy.

## 2016-02-08 NOTE — Assessment & Plan Note (Signed)
Symptoms resolved with moxifloxacin.

## 2016-02-08 NOTE — Progress Notes (Signed)
  Subjective:    CC: Follow-up  HPI: Pneumonia: Symptoms resolved after Avelox.  Hematuria: Suspect that this is related to prostate stones, he has seen a urologist approximately 15-20 years ago, had a pyelogram, no diagnosis was made.  This has only happened after he started anticoagulation for his artificial heart valve. We have done a few courses of fluoroquinolones, he continues to have hematuria.  Agrees to proceed with imaging.  Past medical history:  Negative.  See flowsheet/record as well for more information.  Surgical history: Negative.  See flowsheet/record as well for more information.  Family history: Negative.  See flowsheet/record as well for more information.  Social history: Negative.  See flowsheet/record as well for more information.  Allergies, and medications have been entered into the medical record, reviewed, and no changes needed.   Review of Systems: No fevers, chills, night sweats, weight loss, chest pain, or shortness of breath.   Objective:    General: Well Developed, well nourished, and in no acute distress.  Neuro: Alert and oriented x3, extra-ocular muscles intact, sensation grossly intact.  HEENT: Normocephalic, atraumatic, pupils equal round reactive to light, neck supple, no masses, no lymphadenopathy, thyroid nonpalpable.  Skin: Warm and dry, no rashes. Cardiac: Regular rate and rhythm, no murmurs rubs or gallops, no lower extremity edema.  Respiratory: Clear to auscultation bilaterally. Not using accessory muscles, speaking in full sentences.  Urinalysis shows moderate blood  Impression and Recommendations:    HAP (hospital-acquired pneumonia) Symptoms resolved with moxifloxacin.  Hematuria Suspect that this is related to prostate stones, he has seen a urologist approximately 15-20 years ago, had a pyelogram, no diagnosis was made. This is only happened after he started anticoagulation for his artificial heart valve. We have done a few courses  of fluoroquinolones, he continues to have hematuria so we will proceed with renal ultrasound, if unrevealing we will refer to urology for consideration of direct visualization with cystoscopy.

## 2016-02-09 ENCOUNTER — Encounter: Payer: Self-pay | Admitting: Sports Medicine

## 2016-02-09 ENCOUNTER — Ambulatory Visit (INDEPENDENT_AMBULATORY_CARE_PROVIDER_SITE_OTHER): Payer: BLUE CROSS/BLUE SHIELD | Admitting: Sports Medicine

## 2016-02-09 ENCOUNTER — Ambulatory Visit (INDEPENDENT_AMBULATORY_CARE_PROVIDER_SITE_OTHER): Payer: BLUE CROSS/BLUE SHIELD

## 2016-02-09 VITALS — BP 125/86 | HR 76 | Resp 16 | Wt 178.0 lb

## 2016-02-09 DIAGNOSIS — N323 Diverticulum of bladder: Secondary | ICD-10-CM

## 2016-02-09 DIAGNOSIS — R791 Abnormal coagulation profile: Secondary | ICD-10-CM

## 2016-02-09 DIAGNOSIS — R319 Hematuria, unspecified: Secondary | ICD-10-CM

## 2016-02-09 DIAGNOSIS — Z952 Presence of prosthetic heart valve: Secondary | ICD-10-CM

## 2016-02-09 LAB — POCT INR: INR: 5.4

## 2016-02-09 LAB — PROTIME-INR
INR: 5 — ABNORMAL HIGH
Prothrombin Time: 48.7 s — ABNORMAL HIGH (ref 9.0–11.5)

## 2016-02-09 NOTE — Progress Notes (Signed)
Patient advised of recommendations.  

## 2016-02-09 NOTE — Telephone Encounter (Signed)
Please have patient come in for a nurse visit INR, we can simply adjust the dose here, an INR of 8 without any evidence of bleeding is not an emergency.

## 2016-02-09 NOTE — Telephone Encounter (Signed)
Patient seen at a nurse visit.

## 2016-02-10 ENCOUNTER — Encounter: Payer: Self-pay | Admitting: Sports Medicine

## 2016-02-14 ENCOUNTER — Encounter: Payer: Self-pay | Admitting: Sports Medicine

## 2016-02-15 ENCOUNTER — Encounter: Payer: Self-pay | Admitting: Sports Medicine

## 2016-02-16 ENCOUNTER — Other Ambulatory Visit: Payer: Self-pay | Admitting: Sports Medicine

## 2016-02-20 DIAGNOSIS — Z87442 Personal history of urinary calculi: Secondary | ICD-10-CM

## 2016-02-20 HISTORY — DX: Personal history of urinary calculi: Z87.442

## 2016-03-08 ENCOUNTER — Encounter: Payer: Self-pay | Admitting: Sports Medicine

## 2016-03-08 LAB — POCT INR
INR: 2.2
INR: 2.2
INR: 2.2 — AB (ref <=1.1)

## 2016-03-09 ENCOUNTER — Ambulatory Visit (INDEPENDENT_AMBULATORY_CARE_PROVIDER_SITE_OTHER): Payer: BLUE CROSS/BLUE SHIELD | Admitting: Sports Medicine

## 2016-03-09 DIAGNOSIS — Z952 Presence of prosthetic heart valve: Secondary | ICD-10-CM

## 2016-03-09 NOTE — Progress Notes (Signed)
Patient advised of recommendations.  

## 2016-03-13 ENCOUNTER — Encounter: Payer: Self-pay | Admitting: Sports Medicine

## 2016-03-13 MED ORDER — WARFARIN SODIUM 1 MG PO TABS: 1.0000 mg | ORAL_TABLET | ORAL | 0 refills | Status: DC

## 2016-03-13 NOTE — Telephone Encounter (Signed)
Please look into what is happening with the urology referral per his MyChart question.

## 2016-03-15 ENCOUNTER — Encounter: Payer: Self-pay | Admitting: Sports Medicine

## 2016-03-22 ENCOUNTER — Encounter: Payer: Self-pay | Admitting: Sports Medicine

## 2016-03-22 MED ORDER — WARFARIN SODIUM 1 MG PO TABS: ORAL_TABLET | ORAL | 11 refills | Status: DC

## 2016-03-30 ENCOUNTER — Encounter: Payer: Self-pay | Admitting: Sports Medicine

## 2016-03-30 LAB — POCT INR
INR: 2.5 — AB (ref 0.9–1.1)
INR: 2.5 — AB (ref <=1.1)

## 2016-03-30 LAB — PROTIME-INR

## 2016-04-05 ENCOUNTER — Ambulatory Visit (INDEPENDENT_AMBULATORY_CARE_PROVIDER_SITE_OTHER): Payer: BLUE CROSS/BLUE SHIELD | Admitting: Sports Medicine

## 2016-04-05 DIAGNOSIS — Z952 Presence of prosthetic heart valve: Secondary | ICD-10-CM

## 2016-04-11 ENCOUNTER — Encounter: Payer: Self-pay | Admitting: Sports Medicine

## 2016-04-12 NOTE — Progress Notes (Signed)
Increase to 8 mg daily except 4mg  on Tuesdays, Sundays. Recheck at home in two weeks.

## 2016-04-12 NOTE — Progress Notes (Signed)
This is from January. Adjustments have already been made.

## 2016-04-26 ENCOUNTER — Encounter: Payer: Self-pay | Admitting: Sports Medicine

## 2016-04-26 LAB — POCT INR: INR: 2.2 — AB (ref <=1.1)

## 2016-04-30 ENCOUNTER — Encounter: Payer: Self-pay | Admitting: Sports Medicine

## 2016-04-30 ENCOUNTER — Other Ambulatory Visit: Payer: Self-pay | Admitting: Sports Medicine

## 2016-04-30 DIAGNOSIS — N400 Enlarged prostate without lower urinary tract symptoms: Secondary | ICD-10-CM

## 2016-05-01 ENCOUNTER — Encounter: Payer: Self-pay | Admitting: Sports Medicine

## 2016-05-01 DIAGNOSIS — R319 Hematuria, unspecified: Principal | ICD-10-CM

## 2016-05-01 DIAGNOSIS — N390 Urinary tract infection, site not specified: Secondary | ICD-10-CM

## 2016-05-01 MED ORDER — SULFAMETHOXAZOLE-TRIMETHOPRIM 800-160 MG PO TABS: 1.0000 | ORAL_TABLET | Freq: Two times a day (BID) | ORAL | 0 refills | Status: DC

## 2016-05-02 ENCOUNTER — Encounter: Payer: Self-pay | Admitting: Sports Medicine

## 2016-05-02 LAB — URINALYSIS
Bilirubin Urine: NEGATIVE
Glucose, UA: NEGATIVE
Ketones, ur: NEGATIVE
Nitrite: POSITIVE — AB
Specific Gravity, Urine: 1.019 (ref 1.001–1.035)
pH: 7.5 (ref 5.0–8.0)

## 2016-05-03 ENCOUNTER — Encounter: Payer: Self-pay | Admitting: Sports Medicine

## 2016-05-04 LAB — URINE CULTURE

## 2016-05-07 ENCOUNTER — Encounter: Payer: Self-pay | Admitting: Sports Medicine

## 2016-05-23 ENCOUNTER — Encounter: Payer: Self-pay | Admitting: Sports Medicine

## 2016-05-23 DIAGNOSIS — N411 Chronic prostatitis: Secondary | ICD-10-CM

## 2016-05-23 MED ORDER — LEVOFLOXACIN 750 MG PO TABS: 750.0000 mg | ORAL_TABLET | Freq: Every day | ORAL | 0 refills | Status: DC

## 2016-05-24 ENCOUNTER — Other Ambulatory Visit: Payer: Self-pay | Admitting: Sports Medicine

## 2016-05-25 ENCOUNTER — Ambulatory Visit (INDEPENDENT_AMBULATORY_CARE_PROVIDER_SITE_OTHER): Payer: BLUE CROSS/BLUE SHIELD | Admitting: Sports Medicine

## 2016-05-25 DIAGNOSIS — Z952 Presence of prosthetic heart valve: Secondary | ICD-10-CM

## 2016-05-25 LAB — POCT INR: INR: 2.8

## 2016-05-25 NOTE — Progress Notes (Signed)
Pt advised. Reports he has been taking 8 mg daily except 4mg  on Tuesdays, Fridays. This is different than what is in chart. Pt will recheck at home in 4 weeks.

## 2016-05-27 NOTE — Progress Notes (Signed)
Noted, anticoag note updated to reflect correct dosing.

## 2016-07-13 ENCOUNTER — Encounter: Payer: Self-pay | Admitting: Sports Medicine

## 2016-07-18 ENCOUNTER — Ambulatory Visit: Payer: Self-pay | Admitting: Sports Medicine

## 2016-07-20 ENCOUNTER — Encounter: Payer: Self-pay | Admitting: Sports Medicine

## 2016-07-20 ENCOUNTER — Ambulatory Visit (INDEPENDENT_AMBULATORY_CARE_PROVIDER_SITE_OTHER): Payer: BLUE CROSS/BLUE SHIELD | Admitting: Sports Medicine

## 2016-07-20 VITALS — BP 124/71 | HR 74 | Resp 16 | Wt 183.1 lb

## 2016-07-20 DIAGNOSIS — Z96659 Presence of unspecified artificial knee joint: Secondary | ICD-10-CM

## 2016-07-20 DIAGNOSIS — K4090 Unilateral inguinal hernia, without obstruction or gangrene, not specified as recurrent: Secondary | ICD-10-CM

## 2016-07-20 DIAGNOSIS — Z7901 Long term (current) use of anticoagulants: Secondary | ICD-10-CM | POA: Diagnosis not present

## 2016-07-20 LAB — POCT INR: INR: 3.1

## 2016-07-20 NOTE — Progress Notes (Signed)
  Subjective:    CC: Question hernia  HPI: Dr. [name] is here for evaluation of what may be a hernia in his right lower abdominal wall. Occasionally with Valsalva he'll feel some protrusion and soreness. No nausea, vomiting, passing stool and flat is normally. No constitutional symptoms, no abdominal pain.  Mild recurrent symptoms.  Past medical history:  Negative.  See flowsheet/record as well for more information.  Surgical history: Negative.  See flowsheet/record as well for more information.  Family history: Negative.  See flowsheet/record as well for more information.  Social history: Negative.  See flowsheet/record as well for more information.  Allergies, and medications have been entered into the medical record, reviewed, and no changes needed.   Review of Systems: No fevers, chills, night sweats, weight loss, chest pain, or shortness of breath.   Objective:    General: Well Developed, well nourished, and in no acute distress.  Neuro: Alert and oriented x3, extra-ocular muscles intact, sensation grossly intact.  HEENT: Normocephalic, atraumatic, pupils equal round reactive to light, neck supple, no masses, no lymphadenopathy, thyroid nonpalpable.  Skin: Warm and dry, no rashes. Cardiac: Regular rate and rhythm, no murmurs rubs or gallops, no lower extremity edema.  Respiratory: Clear to auscultation bilaterally. Not using accessory muscles, speaking in full sentences. Abdomen: Soft, nontender, nondistended, no bowel sounds, there is a palpable mass in the right lower abdomen with Valsalva.  On personal review of the CT scan there does appear to be a fat containing hernia at the deep inguinal ring on the right side.  This was discussed with radiology.  Impression and Recommendations:    Inguinal hernia, right There is a nonincarcerated, non-strangulated right inguinal hernia, palpable on exam, and visible on the abdominal and pelvic CT from February 2018. Minimally tender to  palpation slightly think that this is reasonable to consider repair. It is best visible on series 9 image 77 in series 301 image 100. Referral to general surgery.  I spent 25 minutes with this patient, greater than 50% was face-to-face time counseling regarding the above diagnoses

## 2016-07-20 NOTE — Assessment & Plan Note (Signed)
There is a nonincarcerated, non-strangulated right inguinal hernia, palpable on exam, and visible on the abdominal and pelvic CT from February 2018. Minimally tender to palpation slightly think that this is reasonable to consider repair. It is best visible on series 9 image 77 in series 301 image 100. Referral to general surgery.

## 2016-07-27 ENCOUNTER — Encounter: Payer: Self-pay | Admitting: Sports Medicine

## 2016-08-03 ENCOUNTER — Encounter: Payer: Self-pay | Admitting: Sports Medicine

## 2016-08-05 ENCOUNTER — Encounter: Payer: Self-pay | Admitting: Sports Medicine

## 2016-08-08 ENCOUNTER — Other Ambulatory Visit: Payer: Self-pay | Admitting: Sports Medicine

## 2016-08-28 ENCOUNTER — Ambulatory Visit: Payer: Self-pay | Admitting: General Surgery

## 2016-08-28 NOTE — H&P (Signed)
Zachary Calderon 08/28/2016 1:25 PM Location: [facility] Patient #: [id number] DOB: 10-Oct-1955 Married / Language: Zachary Calderon / Race: White Male  History of Present Illness Zachary Hollingshead MD; 08/28/2016 2:14 PM) The patient is a 61 year old male.   Note:He is referred by Dr. Dianah Calderon for consultation regarding a symptomatic right inguinal hernia. He was discovered to have a small right inguinal hernia approximately 2-3 years ago. Recently, he is noted a bulge and sometimes discomfort in that area. No obstruction symptoms. He presents here for further evaluation and treatment. He is on chronic anticoagulation with Coumadin because of a mitral valve replacement. He reports that he had a total knee replacement in September 2017 and tolerated this well except for some bleeding due to his INR being too high postop. He does have some BPH symptoms but takes medication for this. Denies chronic constipation or chronic cough. His wife is here with him.  Past Surgical History (Zachary Calderon, Zachary Calderon; 08/28/2016 1:26 PM) Appendectomy Knee Surgery Right. Tonsillectomy Valve Replacement  Diagnostic Studies History (Zachary Calderon, Zachary Calderon; 08/28/2016 1:26 PM) Colonoscopy never  Allergies (Zachary Calderon, [place]; 08/28/2016 1:28 PM) Cephalexin *CEPHALOSPORINS* Allergies Reconciled  Medication History (Zachary Calderon, Calderon; 08/28/2016 1:29 PM) Warfarin Sodium (5MG  Tablet, Oral) Active. Tamsulosin HCl (0.4MG  Capsule, Oral) Active. Dutasteride (0.5MG  Capsule, Oral) Active. Medications Reconciled  Social History (Zachary Calderon, Zachary Calderon; 08/28/2016 1:26 PM) Alcohol use Moderate alcohol use. Caffeine use Coffee. No drug use Tobacco use Never smoker.  Family History (Zachary Calderon, Zachary Calderon; 08/28/2016 1:26 PM) Arthritis Mother. Cancer Mother. Depression Brother, Sister. Heart Disease Father.  Other Problems (Zachary Calderon, Zachary Calderon; 08/28/2016 1:26 PM) Cancer General  anesthesia - complications Heart murmur Hemorrhoids Inguinal Hernia Kidney Stone Migraine Headache     Review of Systems (Zachary Calderon; 08/28/2016 1:26 PM) General Not Present- Appetite Loss, Chills, Fatigue, Fever, Night Sweats, Weight Gain and Weight Loss. Skin Not Present- Change in Wart/Mole, Dryness, Hives, Jaundice, New Lesions, Non-Healing Wounds, Rash and Ulcer. HEENT Present- Wears glasses/contact lenses. Not Present- Earache, Hearing Loss, Hoarseness, Nose Bleed, Oral Ulcers, Ringing in the Ears, Seasonal Allergies, Sinus Pain, Sore Throat, Visual Disturbances and Yellow Eyes. Respiratory Not Present- Bloody sputum, Chronic Cough, Difficulty Breathing, Snoring and Wheezing. Breast Not Present- Breast Mass, Breast Pain, Nipple Discharge and Skin Changes. Cardiovascular Present- Swelling of Extremities. Not Present- Chest Pain, Difficulty Breathing Lying Down, Leg Cramps, Palpitations, Rapid Heart Rate and Shortness of Breath. Gastrointestinal Not Present- Abdominal Pain, Bloating, Bloody Stool, Change in Bowel Habits, Chronic diarrhea, Constipation, Difficulty Swallowing, Excessive gas, Gets full quickly at meals, Hemorrhoids, Indigestion, Nausea, Rectal Pain and Vomiting. Male Genitourinary Not Present- Blood in Urine, Change in Urinary Stream, Frequency, Impotence, Nocturia, Painful Urination, Urgency and Urine Leakage. Musculoskeletal Present- Swelling of Extremities. Not Present- Back Pain, Joint Pain, Joint Stiffness, Muscle Pain and Muscle Weakness. Neurological Present- Headaches. Not Present- Decreased Memory, Fainting, Numbness, Seizures, Tingling, Tremor, Trouble walking and Weakness. Psychiatric Not Present- Anxiety, Bipolar, Change in Sleep Pattern, Depression, Fearful and Frequent crying. Endocrine Not Present- Cold Intolerance, Excessive Hunger, Hair Changes, Heat Intolerance, Hot flashes and New Diabetes. Hematology Present- Blood Thinners. Not Present-  Easy Bruising, Excessive bleeding, Gland problems, HIV and Persistent Infections.  Vitals (Zachary Calderon; 08/28/2016 1:27 PM) 08/28/2016 1:26 PM Weight: 185.8 lb Height: 72in Body Surface Area: 2.06 m Body Mass Index: 25.2 kg/m  Temp.: 98.62F  Pulse: 71 (Regular)  P.OX: 97% (Room air) BP: 128/82 (Sitting, Left Arm,  Standard)      Physical Exam Zachary Hollingshead MD; 08/28/2016 2:18 PM)  The physical exam findings are as follows: Note:GENERAL APPEARANCE: WDWN in NAD. Pleasant and cooperative.  EARS, NOSE, MOUTH THROAT: [place]/AT external ears: no lesions or deformities external nose: no lesions or deformities hearing: grossly normal lips: moist, no deformities EYES external: conjunctiva, lids, sclerae normal pupils: equal, round glasses: no  CV ascultation: RRR, extremity edema: no  RESP/CHEST auscultation: breath sounds equal and clear respiratory effort: normal  GASTROINTESTINAL abdomen: Soft, non-tender, non-distended, no masses liver and spleen: not enlarged. hernia: Reducible, moderate sized right inguinal hernia scar: Right lower quadrant scar  GENITOURINARY scrotum: no masses penis: no lesions  MUSCULOSKELETAL station and gait: normal digits/nails: no clubbing or cyanosis  SKIN jaundice: none  NEUROLOGIC speech: normal  PSYCHIATRIC alertness and orientation: normal mood/affect/behavior: normal judgement and insight: normal    Assessment & Plan Zachary Hollingshead MD; 08/28/2016 2:19 PM)  INGUINAL HERNIA OF RIGHT SIDE WITHOUT OBSTRUCTION OR GANGRENE (K40.90) Impression: This is a moderate size with some symptomatology. Also appears to be getting larger. He is on chronic anticoagulation. Plan: We discussed open right inguinal hernia repair with mesh and he is interested in getting this done. We'll need to coordinate his anticoagulation bridging with his primary care physician like he did with his joint replacement.  I have  explained the procedure, risks, and aftercare of inguinal hernia repair. Risks include but are not limited to bleeding, infection, wound problems, anesthesia, recurrence, bladder or intestine injury, urinary retention, testicular dysfunction, chronic pain, mesh problems. He seems to understand and agrees with the plan.  Jackolyn Confer, M.D.

## 2016-08-31 ENCOUNTER — Encounter: Payer: Self-pay | Admitting: Sports Medicine

## 2016-08-31 NOTE — Progress Notes (Signed)
Perioperative warfarin/Lovenox advice.

## 2016-10-03 ENCOUNTER — Encounter: Payer: Self-pay | Admitting: Sports Medicine

## 2016-10-03 ENCOUNTER — Other Ambulatory Visit: Payer: Self-pay

## 2016-10-03 ENCOUNTER — Encounter (HOSPITAL_COMMUNITY)
Admission: RE | Admit: 2016-10-03 | Discharge: 2016-10-03 | Disposition: A | Discharge status: Home / Self Care | Payer: BLUE CROSS/BLUE SHIELD | Source: Ambulatory Visit | Attending: General Surgery | Admitting: General Surgery

## 2016-10-03 ENCOUNTER — Encounter (HOSPITAL_COMMUNITY): Payer: Self-pay

## 2016-10-03 DIAGNOSIS — Z85828 Personal history of other malignant neoplasm of skin: Secondary | ICD-10-CM | POA: Insufficient documentation

## 2016-10-03 DIAGNOSIS — E785 Hyperlipidemia, unspecified: Secondary | ICD-10-CM | POA: Diagnosis not present

## 2016-10-03 DIAGNOSIS — M1990 Unspecified osteoarthritis, unspecified site: Secondary | ICD-10-CM | POA: Insufficient documentation

## 2016-10-03 DIAGNOSIS — Z01812 Encounter for preprocedural laboratory examination: Secondary | ICD-10-CM | POA: Insufficient documentation

## 2016-10-03 DIAGNOSIS — Z87442 Personal history of urinary calculi: Secondary | ICD-10-CM | POA: Insufficient documentation

## 2016-10-03 DIAGNOSIS — K4090 Unilateral inguinal hernia, without obstruction or gangrene, not specified as recurrent: Secondary | ICD-10-CM | POA: Insufficient documentation

## 2016-10-03 DIAGNOSIS — Z01818 Encounter for other preprocedural examination: Secondary | ICD-10-CM | POA: Insufficient documentation

## 2016-10-03 DIAGNOSIS — Z952 Presence of prosthetic heart valve: Secondary | ICD-10-CM | POA: Insufficient documentation

## 2016-10-03 DIAGNOSIS — Z96651 Presence of right artificial knee joint: Secondary | ICD-10-CM | POA: Insufficient documentation

## 2016-10-03 DIAGNOSIS — Z79899 Other long term (current) drug therapy: Secondary | ICD-10-CM | POA: Insufficient documentation

## 2016-10-03 DIAGNOSIS — Z7901 Long term (current) use of anticoagulants: Secondary | ICD-10-CM | POA: Insufficient documentation

## 2016-10-03 HISTORY — DX: Pneumonia, unspecified organism: J18.9

## 2016-10-03 HISTORY — DX: Personal history of urinary calculi: Z87.442

## 2016-10-03 HISTORY — DX: Other chronic cystitis without hematuria: N30.20

## 2016-10-03 LAB — COMPREHENSIVE METABOLIC PANEL
ALBUMIN: 4.2 g/dL (ref 3.5–5.0)
ALK PHOS: 52 U/L (ref 38–126)
ALT: 25 U/L (ref 17–63)
AST: 33 U/L (ref 15–41)
Anion gap: 7 (ref 5–15)
BILIRUBIN TOTAL: 1.6 mg/dL — AB (ref 0.3–1.2)
BUN: 15 mg/dL (ref 6–20)
CALCIUM: 9.4 mg/dL (ref 8.9–10.3)
CO2: 28 mmol/L (ref 22–32)
CREATININE: 1.05 mg/dL (ref 0.61–1.24)
Chloride: 105 mmol/L (ref 101–111)
GFR calc Af Amer: >60 mL/min (ref >60)
GLUCOSE: 103 mg/dL — AB (ref 65–99)
POTASSIUM: 4.1 mmol/L (ref 3.5–5.1)
Sodium: 140 mmol/L (ref 135–145)
TOTAL PROTEIN: 6.4 g/dL — AB (ref 6.5–8.1)

## 2016-10-03 LAB — CBC WITH DIFFERENTIAL/PLATELET
BASOS ABS: 0 10*3/uL (ref 0.0–0.1)
Basophils Relative: 0 %
Eosinophils Absolute: 0.1 10*3/uL (ref 0.0–0.7)
Eosinophils Relative: 1 %
HEMATOCRIT: 44.6 % (ref 39.0–52.0)
HEMOGLOBIN: 15.7 g/dL (ref 13.0–17.0)
Lymphocytes Relative: 24 %
Lymphs Abs: 1.2 10*3/uL (ref 0.7–4.0)
MCH: 30.4 pg (ref 26.0–34.0)
MCHC: 35.2 g/dL (ref 30.0–36.0)
MCV: 86.4 fL (ref 78.0–100.0)
MONO ABS: 0.6 10*3/uL (ref 0.1–1.0)
Monocytes Relative: 11 %
Neutro Abs: 3.2 10*3/uL (ref 1.7–7.7)
Neutrophils Relative %: 64 %
Platelets: 146 10*3/uL — ABNORMAL LOW (ref 150–400)
RBC: 5.16 MIL/uL (ref 4.22–5.81)
RDW: 12.8 % (ref 11.5–15.5)
WBC: 5 10*3/uL (ref 4.0–10.5)

## 2016-10-03 LAB — PROTIME-INR
INR: 1.08
PROTHROMBIN TIME: 14 s (ref 11.4–15.2)

## 2016-10-03 NOTE — Progress Notes (Signed)
Pt. Is followed by Dr. Comer Locket, for PCP- has been bridged with Lovenox. Coumadin is on hold, last visit with Dr. Acie Fredrickson- several yrs. Ago. Call to A.  Zelenak, PA-C to review this pt.'s history.  Echo within  the last yr. Pt. Denies All chest concerns. He is aware after a review is done by anesth. Consult , that they will call him if there is a need for a cardiac visit prior to this inguinal hernia repair.

## 2016-10-03 NOTE — Progress Notes (Signed)
In view of the chronic bladder infections, & a "sensation" that got his attention today, pt. Dipped his own urine at his office & he reports there were red & white cells present. Pt. Encouraged to call Dr. Zella Richer with this info. But he also has transmitted the data to his PCP .

## 2016-10-03 NOTE — Pre-Procedure Instructions (Signed)
Zach[facility] Calderon  10/03/2016      [id number] [place] [facility], [place] [address] [facility] [address] Phone: [phone] Fax: [phone]    Your procedure is scheduled on 10/05/2016  Report to [facility] Admitting at 5:30 A.M.  Call this number if you have problems the morning of surgery:  [phone]   Remember:  Do not eat food or drink liquids after midnight.  On Thursday   Take these medicines the morning of surgery with A SIP OF WATER : Nothing    Do not wear jewelry   Do not wear lotions, powders, or perfumes, or deoderant.              Men may shave face and neck.   Do not bring valuables to the [facility].   [facility] is not responsible for any belongings or valuables.  Contacts, dentures or bridgework may not be worn into surgery.  Leave your suitcase in the car.  After surgery it may be brought to your room.  For patients admitted to the [facility], discharge time will be determined by your treatment team.  Patients discharged the day of surgery will not be allowed to drive home.   Name and phone number of your driver:   With family  Special instructions:  Special Instructions: [facility] - Preparing for Surgery  Before surgery, you can play an important role.  Because skin is not sterile, your skin needs to be as free of germs as possible.  You can reduce the number of germs on you skin by washing with CHG (chlorahexidine gluconate) soap before surgery.  CHG is an antiseptic cleaner which kills germs and bonds with the skin to continue killing germs even after washing.  Please DO NOT use if you have an allergy to CHG or antibacterial soaps.  If your skin becomes reddened/irritated stop using the CHG and inform your nurse when you arrive at Short Stay.  Do not shave (including legs and underarms) for at least 48 hours prior to the first CHG shower.  You may shave your face.  Please follow these instructions  carefully:   1.  Shower with CHG Soap the night before surgery and the  morning of Surgery.  2.  If you choose to wash your hair, wash your hair first as usual with your  normal shampoo.  3.  After you shampoo, rinse your hair and body thoroughly to remove the  Shampoo.  4.  Use CHG as you would any other liquid soap.  You can apply chg directly to the skin and wash gently with scrungie or a clean washcloth.  5.  Apply the CHG Soap to your body ONLY FROM THE NECK DOWN.    Do not use on open wounds or open sores.  Avoid contact with your eyes, ears, mouth and genitals (private parts).  Wash genitals (private parts)   with your normal soap.  6.  Wash thoroughly, paying special attention to the area where your surgery will be performed.  7.  Thoroughly rinse your body with warm water from the neck down.  8.  DO NOT shower/wash with your normal soap after using and rinsing off   the CHG Soap.  9.  Pat yourself dry with a clean towel.            10.  Wear clean pajamas.            11.  Place  clean sheets on your bed the night of your first shower and do not sleep with pets.  Day of Surgery  Do not apply any lotions/deodorants the morning of surgery.  Please wear clean clothes to the [facility]/surgery center.  Please read over the following fact sheets that you were given. Pain Booklet and Coughing and Deep Breathing

## 2016-10-03 NOTE — Progress Notes (Signed)
Anesthesia Chart Review: Patient is a 61 year old male scheduled for right inguinal hernia repair on 10/05/16 by Dr. Jackolyn Confer.  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 Maurie Boettcher, [facility]), post-operative N/V, HLD, BPH/prostatits, migraines, tinnitus, skin cancer (BCC), nephrolithiasis, arthritis, right TKA 11/07/15, appendectomy, tonsillectomy. Right basilar PNA 01/2016 s/p Avelox.   He was seeing cardiologist Dr. Mertie Moores, but last visit 08/14/10. Patient lost his job shortly thereafter and did not have health insurance. Dr. Acie Fredrickson assisted with continued warfarin management, but in May 2014 advised that he would either need to be seen for yearly cardiology visits or have his PCP manage. Since at least 2017 patient has been seeing Dr. Aundria Mems with [facility] who has been managing anticoagulation for his St. Jude MV. Dr. Dianah Field referred him to general surgery for finding of tender, non-incarcerated, non-strangulated right inguinal hernia. Dr. Zella Richer has asked him to manage patient Lovenox bridge for surgery. He last ordered an echo in 2017 and showed that MV prothesis appears to open well, mild MR, normal EF.  Meds include Excedrin Migraine, Avodart, fish oil, Maxalt-MLT, tamsulosin, turmeric, chromium picolinate, warfarin (on hold; with Lovenox bridge)  BP 136/76   Pulse 77   Temp 36.8 C   Resp 20   Ht 6' (1.829 m)   Wt 188 lb 1.6 oz (85.3 kg)   SpO2 99%   BMI 25.51 kg/m   EKG 10/03/16: NSR.  Echo 09/01/15: Study Conclusions - Left ventricle: Systolic function was normal. The estimated   ejection fraction was in the range of 60% to 65%. Doppler   parameters are consistent with abnormal left ventricular   relaxation (grade 1 diastolic dysfunction). - Mitral valve: MV prosthesis appears to open well Peak and  mean   gradients through the valve are 7 and 4 mm Hg respectively There   was mild regurgitation. - Left atrium: The atrium was moderately dilated.  Patient reports his cardiac cath in 2000 prior to MV surgery was "clear."  Preoperative labs noted. Cr 1.05. AST 33, ALT 25, total bilirubin 1.6. H/H 15.7/44.6. PLT 146K. PT/INR WNL.  Patient with history of St. Jude MVR '00. Currently being followed by PCP who ordered an echo last year that showed good MV opening. He is on Lovenox bridging for surgery. He tolerated right TKA last year. He denied any CV concerns at PAT. If no acute changes then I anticipate that he can proceed as planned. Discussed with anesthesiologist Dr. Lissa Hoard.  [facility] Short Stay Center/Anesthesiology Phone [phone] 10/03/2016 4:21 PM

## 2016-10-04 ENCOUNTER — Encounter: Payer: Self-pay | Admitting: Sports Medicine

## 2016-10-04 ENCOUNTER — Ambulatory Visit (INDEPENDENT_AMBULATORY_CARE_PROVIDER_SITE_OTHER): Payer: BLUE CROSS/BLUE SHIELD | Admitting: Sports Medicine

## 2016-10-04 ENCOUNTER — Other Ambulatory Visit: Payer: Self-pay | Admitting: Sports Medicine

## 2016-10-04 VITALS — BP 134/76 | HR 80 | Temp 98.2°F | Ht 72.0 in | Wt 188.0 lb

## 2016-10-04 DIAGNOSIS — R300 Dysuria: Secondary | ICD-10-CM | POA: Diagnosis not present

## 2016-10-04 DIAGNOSIS — R350 Frequency of micturition: Secondary | ICD-10-CM | POA: Diagnosis not present

## 2016-10-04 DIAGNOSIS — N401 Enlarged prostate with lower urinary tract symptoms: Secondary | ICD-10-CM

## 2016-10-04 LAB — POCT URINALYSIS DIPSTICK
Bilirubin, UA: NEGATIVE
Blood, UA: NEGATIVE
Glucose, UA: NEGATIVE
Nitrite, UA: NEGATIVE
Protein, UA: NEGATIVE
Spec Grav, UA: 1.025 (ref 1.010–1.025)
Urobilinogen, UA: 0.2 E.U./dL
pH, UA: 6 (ref 5.0–8.0)

## 2016-10-04 MED ORDER — VANCOMYCIN HCL IN DEXTROSE 1-5 GM/200ML-% IV SOLN: 1000.0000 mg | INTRAVENOUS | Status: AC

## 2016-10-04 MED ORDER — LEVOFLOXACIN 750 MG PO TABS: 750.0000 mg | ORAL_TABLET | Freq: Every day | ORAL | 0 refills | Status: DC

## 2016-10-04 NOTE — Progress Notes (Signed)
Patient is here for burning on urination. Patient reports no recent antibiotics or use of Catheterization. Patient has not taken any Azo. Denies flank pain, pelvic pain, fever, chills or sweats. Maveric Debono,CMA

## 2016-10-04 NOTE — Assessment & Plan Note (Signed)
With intermittent chronic prostatitis, adding antibiotics for 28 days. Awaiting urine cultures.

## 2016-10-05 LAB — URINE CULTURE: Organism ID, Bacteria: NO GROWTH

## 2016-10-08 NOTE — Progress Notes (Signed)
Patient has been notified and he verbally understands. Rhonda Cunningham,CMA

## 2016-10-11 ENCOUNTER — Encounter: Payer: Self-pay | Admitting: Sports Medicine

## 2016-10-15 ENCOUNTER — Encounter: Payer: Self-pay | Admitting: Sports Medicine

## 2016-10-16 MED ORDER — ENOXAPARIN SODIUM 30 MG/0.3ML ~~LOC~~ SOLN: 30.0000 mg | Freq: Two times a day (BID) | SUBCUTANEOUS | 0 refills | Status: DC

## 2016-10-18 ENCOUNTER — Ambulatory Visit (INDEPENDENT_AMBULATORY_CARE_PROVIDER_SITE_OTHER): Payer: BLUE CROSS/BLUE SHIELD | Admitting: Sports Medicine

## 2016-10-18 DIAGNOSIS — Z952 Presence of prosthetic heart valve: Secondary | ICD-10-CM

## 2016-10-18 LAB — POCT INR: INR: 2.1

## 2016-10-19 ENCOUNTER — Encounter: Payer: Self-pay | Admitting: Sports Medicine

## 2016-10-24 ENCOUNTER — Other Ambulatory Visit: Payer: Self-pay | Admitting: Sports Medicine

## 2016-10-24 DIAGNOSIS — N400 Enlarged prostate without lower urinary tract symptoms: Secondary | ICD-10-CM

## 2016-10-25 ENCOUNTER — Encounter (HOSPITAL_COMMUNITY): Payer: Self-pay | Admitting: *Deleted

## 2016-10-25 NOTE — Anesthesia Preprocedure Evaluation (Addendum)
Anesthesia Evaluation  Patient identified by MRN, date of birth, ID band Patient awake    Reviewed: Allergy & Precautions, H&P , Patient's Chart, lab work & pertinent test results, reviewed documented beta blocker date and time   History of Anesthesia Complications (+) PONV and history of anesthetic complications  Airway Mallampati: II  TM Distance: >3 FB Neck ROM: full    Dental no notable dental hx. (+) Teeth Intact, Dental Advisory Given   Pulmonary    Pulmonary exam normal breath sounds clear to auscultation       Cardiovascular  Rhythm:regular Rate:Normal     Neuro/Psych    GI/Hepatic   Endo/Other    Renal/GU      Musculoskeletal  (+) Arthritis ,   Abdominal   Peds  Hematology   Anesthesia Other Findings   Reproductive/Obstetrics                            Anesthesia Physical Anesthesia Plan  ASA: II  Anesthesia Plan: General   Post-op Pain Management:    Induction: Intravenous  PONV Risk Score and Plan: 2 and Ondansetron and Dexamethasone  Airway Management Planned: LMA  Additional Equipment:   Intra-op Plan:   Post-operative Plan:   Informed Consent: I have reviewed the patients History and Physical, chart, labs and discussed the procedure including the risks, benefits and alternatives for the proposed anesthesia with the patient or authorized representative who has indicated his/her understanding and acceptance.   Dental Advisory Given  Plan Discussed with: CRNA and Surgeon  Anesthesia Plan Comments: ( )        Anesthesia Quick Evaluation

## 2016-10-26 ENCOUNTER — Ambulatory Visit (HOSPITAL_COMMUNITY): Payer: BLUE CROSS/BLUE SHIELD | Admitting: Anesthesiology

## 2016-10-26 ENCOUNTER — Encounter (HOSPITAL_COMMUNITY)
Admission: RE | Disposition: A | Discharge status: Home / Self Care | Payer: Self-pay | Source: Ambulatory Visit | Attending: General Surgery

## 2016-10-26 ENCOUNTER — Ambulatory Visit (HOSPITAL_COMMUNITY): Payer: BLUE CROSS/BLUE SHIELD | Admitting: Vascular Surgery

## 2016-10-26 ENCOUNTER — Encounter (HOSPITAL_COMMUNITY): Payer: Self-pay

## 2016-10-26 ENCOUNTER — Ambulatory Visit (HOSPITAL_COMMUNITY)
Admission: RE | Admit: 2016-10-26 | Discharge: 2016-10-26 | Disposition: A | Discharge status: Home / Self Care | Payer: BLUE CROSS/BLUE SHIELD | Source: Ambulatory Visit | Attending: General Surgery | Admitting: General Surgery

## 2016-10-26 DIAGNOSIS — Z8249 Family history of ischemic heart disease and other diseases of the circulatory system: Secondary | ICD-10-CM | POA: Diagnosis not present

## 2016-10-26 DIAGNOSIS — Z818 Family history of other mental and behavioral disorders: Secondary | ICD-10-CM | POA: Diagnosis not present

## 2016-10-26 DIAGNOSIS — Z79899 Other long term (current) drug therapy: Secondary | ICD-10-CM | POA: Diagnosis not present

## 2016-10-26 DIAGNOSIS — Z7982 Long term (current) use of aspirin: Secondary | ICD-10-CM | POA: Insufficient documentation

## 2016-10-26 DIAGNOSIS — K4090 Unilateral inguinal hernia, without obstruction or gangrene, not specified as recurrent: Secondary | ICD-10-CM | POA: Diagnosis not present

## 2016-10-26 DIAGNOSIS — Z952 Presence of prosthetic heart valve: Secondary | ICD-10-CM | POA: Insufficient documentation

## 2016-10-26 DIAGNOSIS — Z8261 Family history of arthritis: Secondary | ICD-10-CM | POA: Diagnosis not present

## 2016-10-26 DIAGNOSIS — Z87442 Personal history of urinary calculi: Secondary | ICD-10-CM | POA: Insufficient documentation

## 2016-10-26 DIAGNOSIS — Z881 Allergy status to other antibiotic agents status: Secondary | ICD-10-CM | POA: Diagnosis not present

## 2016-10-26 DIAGNOSIS — G43909 Migraine, unspecified, not intractable, without status migrainosus: Secondary | ICD-10-CM | POA: Diagnosis not present

## 2016-10-26 DIAGNOSIS — Z809 Family history of malignant neoplasm, unspecified: Secondary | ICD-10-CM | POA: Diagnosis not present

## 2016-10-26 DIAGNOSIS — Z7901 Long term (current) use of anticoagulants: Secondary | ICD-10-CM | POA: Diagnosis not present

## 2016-10-26 HISTORY — PX: INGUINAL HERNIA REPAIR: SHX194

## 2016-10-26 HISTORY — PX: INSERTION OF MESH: SHX5868

## 2016-10-26 LAB — BASIC METABOLIC PANEL
Anion gap: 10 (ref 5–15)
BUN: 16 mg/dL (ref 6–20)
CO2: 22 mmol/L (ref 22–32)
Calcium: 9 mg/dL (ref 8.9–10.3)
Chloride: 106 mmol/L (ref 101–111)
Creatinine, Ser: 1.1 mg/dL (ref 0.61–1.24)
GFR calc non Af Amer: >60 mL/min (ref >60)
Glucose, Bld: 93 mg/dL (ref 65–99)
POTASSIUM: 4.1 mmol/L (ref 3.5–5.1)
SODIUM: 138 mmol/L (ref 135–145)

## 2016-10-26 LAB — CBC
HEMATOCRIT: 41.8 % (ref 39.0–52.0)
HEMOGLOBIN: 14.4 g/dL (ref 13.0–17.0)
MCH: 30.1 pg (ref 26.0–34.0)
MCHC: 34.4 g/dL (ref 30.0–36.0)
MCV: 87.3 fL (ref 78.0–100.0)
Platelets: 135 10*3/uL — ABNORMAL LOW (ref 150–400)
RBC: 4.79 MIL/uL (ref 4.22–5.81)
RDW: 13.2 % (ref 11.5–15.5)
WBC: 3.4 10*3/uL — AB (ref 4.0–10.5)

## 2016-10-26 LAB — PROTIME-INR
INR: 1.06
Prothrombin Time: 13.7 seconds (ref 11.4–15.2)

## 2016-10-26 SURGERY — REPAIR, HERNIA, INGUINAL, ADULT
Anesthesia: General | Site: Inguinal | Laterality: Right

## 2016-10-26 MED ORDER — DEXAMETHASONE SODIUM PHOSPHATE 10 MG/ML IJ SOLN
INTRAMUSCULAR | Status: DC | PRN
  Administered 2016-10-26: 10 mg via INTRAVENOUS

## 2016-10-26 MED ORDER — CHLORHEXIDINE GLUCONATE CLOTH 2 % EX PADS: 6.0000 | MEDICATED_PAD | Freq: Once | CUTANEOUS | Status: DC

## 2016-10-26 MED ORDER — BUPIVACAINE-EPINEPHRINE (PF) 0.5% -1:200000 IJ SOLN
INTRAMUSCULAR | Status: AC
  Filled 2016-10-26: qty 30

## 2016-10-26 MED ORDER — LIDOCAINE 2% (20 MG/ML) 5 ML SYRINGE
INTRAMUSCULAR | Status: DC | PRN
  Administered 2016-10-26: 100 mg via INTRAVENOUS

## 2016-10-26 MED ORDER — PHENYLEPHRINE 40 MCG/ML (10ML) SYRINGE FOR IV PUSH (FOR BLOOD PRESSURE SUPPORT)
PREFILLED_SYRINGE | INTRAVENOUS | Status: DC | PRN
Start: 2016-10-26 — End: 2016-10-26
  Administered 2016-10-26: 80 ug via INTRAVENOUS
  Administered 2016-10-26: 40 ug via INTRAVENOUS

## 2016-10-26 MED ORDER — VANCOMYCIN HCL 1000 MG IV SOLR
1000.0000 mg | INTRAVENOUS | Status: AC
  Administered 2016-10-26: 1000 mg
  Filled 2016-10-26: qty 1000

## 2016-10-26 MED ORDER — VANCOMYCIN HCL IN DEXTROSE 1-5 GM/200ML-% IV SOLN
INTRAVENOUS | Status: AC
  Filled 2016-10-26: qty 200

## 2016-10-26 MED ORDER — DEXAMETHASONE SODIUM PHOSPHATE 10 MG/ML IJ SOLN
INTRAMUSCULAR | Status: AC
  Filled 2016-10-26: qty 1

## 2016-10-26 MED ORDER — ONDANSETRON HCL 4 MG/2ML IJ SOLN
INTRAMUSCULAR | Status: AC
  Filled 2016-10-26: qty 2

## 2016-10-26 MED ORDER — MIDAZOLAM HCL 2 MG/2ML IJ SOLN
INTRAMUSCULAR | Status: AC
  Filled 2016-10-26: qty 2

## 2016-10-26 MED ORDER — OXYCODONE HCL 5 MG PO TABS: 5.0000 mg | ORAL_TABLET | ORAL | 0 refills | Status: DC | PRN

## 2016-10-26 MED ORDER — MIDAZOLAM HCL 2 MG/2ML IJ SOLN
INTRAMUSCULAR | Status: DC | PRN
  Administered 2016-10-26: 2 mg via INTRAVENOUS

## 2016-10-26 MED ORDER — FENTANYL CITRATE (PF) 250 MCG/5ML IJ SOLN
INTRAMUSCULAR | Status: AC
  Filled 2016-10-26: qty 5

## 2016-10-26 MED ORDER — PROPOFOL 10 MG/ML IV BOLUS
INTRAVENOUS | Status: DC | PRN
  Administered 2016-10-26: 200 mg via INTRAVENOUS

## 2016-10-26 MED ORDER — ONDANSETRON HCL 4 MG/2ML IJ SOLN
INTRAMUSCULAR | Status: DC | PRN
  Administered 2016-10-26: 4 mg via INTRAVENOUS

## 2016-10-26 MED ORDER — PHENYLEPHRINE 40 MCG/ML (10ML) SYRINGE FOR IV PUSH (FOR BLOOD PRESSURE SUPPORT)
PREFILLED_SYRINGE | INTRAVENOUS | Status: AC
  Filled 2016-10-26: qty 10

## 2016-10-26 MED ORDER — FENTANYL CITRATE (PF) 100 MCG/2ML IJ SOLN: INTRAMUSCULAR | Status: DC | PRN

## 2016-10-26 MED ORDER — 0.9 % SODIUM CHLORIDE (POUR BTL) OPTIME
TOPICAL | Status: DC | PRN
  Administered 2016-10-26: 1000 mL

## 2016-10-26 MED ORDER — LACTATED RINGERS IV SOLN
INTRAVENOUS | Status: DC | PRN
Start: 2016-10-26 — End: 2016-10-26
  Administered 2016-10-26: 07:00:00 via INTRAVENOUS

## 2016-10-26 MED ORDER — FENTANYL CITRATE (PF) 100 MCG/2ML IJ SOLN
INTRAMUSCULAR | Status: DC | PRN
  Administered 2016-10-26: 25 ug via INTRAVENOUS

## 2016-10-26 MED ORDER — LIDOCAINE 2% (20 MG/ML) 5 ML SYRINGE
INTRAMUSCULAR | Status: AC
  Filled 2016-10-26: qty 5

## 2016-10-26 MED ORDER — PROPOFOL 10 MG/ML IV BOLUS
INTRAVENOUS | Status: AC
  Filled 2016-10-26: qty 40

## 2016-10-26 MED ORDER — BUPIVACAINE-EPINEPHRINE 0.5% -1:200000 IJ SOLN
INTRAMUSCULAR | Status: DC | PRN
  Administered 2016-10-26: 20 mL

## 2016-10-26 SURGICAL SUPPLY — 50 items
APL SKNCLS STERI-STRIP NONHPOA (GAUZE/BANDAGES/DRESSINGS) ×1
BENZOIN TINCTURE PRP APPL 2/3 (GAUZE/BANDAGES/DRESSINGS) ×2 IMPLANT
BLADE CLIPPER SURG (BLADE) ×2 IMPLANT
BLADE SURG 10 STRL SS (BLADE) ×2 IMPLANT
BLADE SURG 15 STRL LF DISP TIS (BLADE) ×1 IMPLANT
BLADE SURG 15 STRL SS (BLADE) ×1
CHLORAPREP W/TINT 26ML (MISCELLANEOUS) ×2 IMPLANT
CLSR STERI-STRIP ANTIMIC 1/2X4 (GAUZE/BANDAGES/DRESSINGS) ×2 IMPLANT
COVER SURGICAL LIGHT HANDLE (MISCELLANEOUS) ×2 IMPLANT
DRAIN PENROSE 1/2X12 LTX STRL (WOUND CARE) ×2 IMPLANT
DRAPE INCISE IOBAN 66X45 STRL (DRAPES) ×2 IMPLANT
DRAPE LAPAROTOMY TRNSV 102X78 (DRAPE) ×2 IMPLANT
DRAPE UTILITY XL STRL (DRAPES) ×4 IMPLANT
DRSG TEGADERM 4X4.75 (GAUZE/BANDAGES/DRESSINGS) ×2 IMPLANT
DRSG TELFA 3X8 NADH (GAUZE/BANDAGES/DRESSINGS) ×2 IMPLANT
ELECT CAUTERY BLADE 6.4 (BLADE) ×2 IMPLANT
ELECT REM PT RETURN 9FT ADLT (ELECTROSURGICAL) ×2
ELECTRODE REM PT RTRN 9FT ADLT (ELECTROSURGICAL) ×1 IMPLANT
GAUZE SPONGE 4X4 16PLY XRAY LF (GAUZE/BANDAGES/DRESSINGS) ×2 IMPLANT
GLOVE BIOGEL PI IND STRL 6 (GLOVE) ×1 IMPLANT
GLOVE BIOGEL PI IND STRL 8 (GLOVE) ×2 IMPLANT
GLOVE BIOGEL PI INDICATOR 6 (GLOVE) ×1
GLOVE BIOGEL PI INDICATOR 8 (GLOVE) ×2
GLOVE ECLIPSE 8.0 STRL XLNG CF (GLOVE) ×2 IMPLANT
GLOVE SURG SS PI 6.0 STRL IVOR (GLOVE) ×2 IMPLANT
GLOVE SURG SS PI 8.0 STRL IVOR (GLOVE) ×2 IMPLANT
GOWN STRL REUS W/ TWL LRG LVL3 (GOWN DISPOSABLE) ×3 IMPLANT
GOWN STRL REUS W/TWL LRG LVL3 (GOWN DISPOSABLE) ×3
KIT BASIN OR (CUSTOM PROCEDURE TRAY) ×2 IMPLANT
KIT ROOM TURNOVER OR (KITS) ×2 IMPLANT
MESH HERNIA 6X6 BARD (Mesh General) ×1 IMPLANT
MESH HERNIA BARD 6X6 (Mesh General) ×1 IMPLANT
NEEDLE HYPO 25GX1X1/2 BEV (NEEDLE) ×2 IMPLANT
NS IRRIG 1000ML POUR BTL (IV SOLUTION) ×2 IMPLANT
PACK SURGICAL SETUP 50X90 (CUSTOM PROCEDURE TRAY) ×2 IMPLANT
PAD ARMBOARD 7.5X6 YLW CONV (MISCELLANEOUS) ×2 IMPLANT
PENCIL BUTTON HOLSTER BLD 10FT (ELECTRODE) ×2 IMPLANT
SPECIMEN JAR SMALL (MISCELLANEOUS) IMPLANT
SPONGE LAP 18X18 X RAY DECT (DISPOSABLE) ×2 IMPLANT
STRIP CLOSURE SKIN 1/2X4 (GAUZE/BANDAGES/DRESSINGS) IMPLANT
SUT MON AB 4-0 PC3 18 (SUTURE) ×2 IMPLANT
SUT PROLENE 2 0 CT2 30 (SUTURE) ×4 IMPLANT
SUT SILK 2 0 SH (SUTURE) IMPLANT
SUT VIC AB 2-0 SH 18 (SUTURE) ×2 IMPLANT
SUT VIC AB 3-0 SH 27 (SUTURE) ×4
SUT VIC AB 3-0 SH 27XBRD (SUTURE) ×2 IMPLANT
SUT VICRYL AB 3 0 TIES (SUTURE) ×2 IMPLANT
SYR CONTROL 10ML LL (SYRINGE) ×2 IMPLANT
TOWEL OR 17X24 6PK STRL BLUE (TOWEL DISPOSABLE) ×2 IMPLANT
TOWEL OR 17X26 10 PK STRL BLUE (TOWEL DISPOSABLE) IMPLANT

## 2016-10-26 NOTE — Discharge Instructions (Addendum)
[facility]   INGUINAL HERNIA REPAIR: POST OP INSTRUCTIONS  Always review your discharge instruction sheet given to you by the facility where your surgery was performed. IF YOU HAVE DISABILITY OR FAMILY LEAVE FORMS, YOU MUST BRING THEM TO THE OFFICE FOR PROCESSING.   DO NOT GIVE THEM TO YOUR DOCTOR.  1. A  prescription for pain medication may be given to you upon discharge.  Take your pain medication as prescribed, if needed.  If narcotic pain medicine is not needed, then you may take acetaminophen (Tylenol) or ibuprofen (Advil) as needed. 2. Take your usually prescribed medications unless otherwise directed. 3. If you need a refill on your pain medication, please contact your pharmacy.  They will contact our office to request authorization. Prescriptions will not be filled after 5 pm or on week-ends. 4. You should follow a light diet the first 24 hours after arrival home, such as soup and crackers, etc.  Be sure to include lots of fluids daily.  Resume your normal diet the day after surgery. 5. Most patients will experience some swelling and bruising in the groin area (and scrotum in men).  Ice packs and reclining will help.  Swelling and bruising can take many days to resolve.  6. It is common to experience some constipation if taking pain medication after surgery.  Increasing fluid intake and taking a stool softener (such as Colace) will usually help or prevent this problem from occurring.  A mild laxative (Milk of Magnesia or Miralax) should be taken according to package directions if there are no bowel movements after 48 hours. 7. Unless discharge instructions indicate otherwise, you may remove your bandages 4 days after surgery, and you may shower at that time.  You may have steri-strips (small skin tapes) in place directly over the incision.  These strips should be left on the skin.  If your surgeon used skin glue on the incision, you may shower in 24 hours.  The glue  will flake off over the next 2-3 weeks.  Any sutures or staples will be removed at the office during your follow-up visit. 8. ACTIVITIES:  You may resume regular (light) daily activities beginning the next day--such as daily self-care, walking, climbing stairs--gradually increasing activities as tolerated.  You may have sexual intercourse when it is comfortable.  Refrain from any heavy lifting or straining-nothing over 10 pounds for 6 weeks.  Do note lie flat for 2-3 days. a. You may drive when you are no longer taking prescription pain medication, you can comfortably wear a seatbelt, and you can safely maneuver your car and apply brakes. b. RETURN TO WORK:  Desk work/Light work in 1-2 weeks, full duty in 6 weeks._________________________________________________________ 9. You should see your doctor in the office for a follow-up appointment approximately 2-3 weeks after your surgery.  Make sure that you call for this appointment within a day or two after you arrive home to insure a convenient appointment time. 10. OTHER INSTRUCTIONS:  ___Take Lovenox and Coumadin as directed by your PCP_______________________________________________________________________________________________________________________________________________________________________________________  WHEN TO CALL YOUR DOCTOR: 1. Fever over 101.0 2. Inability to urinate 3. Nausea and/or vomiting 4. Extreme swelling or bruising 5. Continued bleeding from incision. 6. Increased pain, redness, or drainage from the incision  The clinic staff is available to answer your questions during regular business hours.  Please dont hesitate to call and ask to speak to one of the nurses for clinical concerns.  If you have a medical emergency, go to the  nearest emergency room or call 911.  A surgeon from [facility] is always on call at the hospital   [address], [place], [place], [place]  [address] ?  [address],  [place], [place]   [address] [phone] ? [phone] ? [phone] Web site: [email or web address]

## 2016-10-26 NOTE — Anesthesia Postprocedure Evaluation (Signed)
Anesthesia Post Note  Patient: Zachary Calderon  Procedure(s) Performed: Procedure(s) (LRB): RIGHT INGUINAL HERNIA REPAIR WITH MESH (Right) INSERTION OF MESH (Right)     Patient location during evaluation: PACU Anesthesia Type: General Level of consciousness: awake and alert Pain management: pain level controlled Vital Signs Assessment: post-procedure vital signs reviewed and stable Respiratory status: spontaneous breathing, nonlabored ventilation, respiratory function stable and patient connected to nasal cannula oxygen Cardiovascular status: blood pressure returned to baseline and stable Postop Assessment: no signs of nausea or vomiting Anesthetic complications: no    Last Vitals:  Vitals:   10/26/16 0945 10/26/16 1000  BP: 126/77 113/67  Pulse: 66 66  Resp: 14 18  Temp:  36.5 C  SpO2: 98% 100%    Last Pain:  Vitals:   10/26/16 0546  TempSrc: Oral                 Miia Blanks EDWARD

## 2016-10-26 NOTE — Transfer of Care (Signed)
Immediate Anesthesia Transfer of Care Note  Patient: Zachary Calderon  Procedure(s) Performed: Procedure(s) with comments: RIGHT INGUINAL HERNIA REPAIR WITH MESH (Right) - GENERAL AND LMA  INSERTION OF MESH (Right)  Patient Location: PACU  Anesthesia Type:General  Level of Consciousness: awake, alert  and oriented  Airway & Oxygen Therapy: Patient Spontanous Breathing  Post-op Assessment: Report given to RN, Post -op Vital signs reviewed and stable and Patient moving all extremities X 4  Post vital signs: Reviewed and stable  Last Vitals:  Vitals:   10/26/16 0546  BP: 128/77  Pulse: 69  Resp: 20  Temp: 36.9 C  SpO2: 99%    Last Pain:  Vitals:   10/26/16 0546  TempSrc: Oral         Complications: No apparent anesthesia complications

## 2016-10-26 NOTE — Interval H&P Note (Signed)
History and Physical Interval Note:  10/26/2016 7:31 AM  Zachary Calderon  has presented today for surgery, with the diagnosis of right inguinal hernia  The various methods of treatment have been discussed with the patient and family. After consideration of risks, benefits and other options for treatment, the patient has consented to  Procedure(s) with comments: RIGHT INGUINAL HERNIA REPAIR WITH MESH (Right) - GENERAL AND LMA  INSERTION OF MESH (Right) as a surgical intervention .  The patient's history has been reviewed, patient examined, no change in status, stable for surgery.  I have reviewed the patient's chart and labs.  Questions were answered to the patient's satisfaction.     Jakarri Lesko Lenna Sciara

## 2016-10-26 NOTE — H&P (Signed)
Zachary Calderon  DOB: [birth year] Married / Language: Cleophus Calderon / Race: White Male  History of Present Illness  The patient is a 61 year old male.   Note:He was referred by Dr. Dianah Field for consultation regarding a symptomatic right inguinal hernia. He was discovered to have a small right inguinal hernia approximately 2-3 years ago. Recently, he is noted a bulge and sometimes discomfort in that area. No obstruction symptoms. He presents here for further evaluation and treatment. He is on chronic anticoagulation with Coumadin because of a mitral valve replacement. He reports that he had a total knee replacement in September 2017 and tolerated this well except for some bleeding due to his INR being too high postop. He does have some BPH symptoms but takes medication for this. Denies chronic constipation or chronic cough. Was treated recently for a UTI.  Past Surgical History Appendectomy Knee Surgery Right. Tonsillectomy Valve Replacement    Allergies  Cephalexin *CEPHALOSPORINS*   Prior to Admission medications   Medication Sig Start Date End Date Taking? Authorizing Provider  aspirin-acetaminophen-caffeine (EXCEDRIN MIGRAINE) [phone] MG tablet Take 2-3 tablets by mouth 3 (three) times daily as needed for headache or migraine.   Yes [provider]  b complex vitamins tablet Take 1 tablet by mouth daily.   Yes [provider]  Calcium-Magnesium-Zinc (CAL-MAG-ZINC PO) Take 2 tablets by mouth daily.   Yes [provider]  CHROMIUM PICOLINATE PO Take 1 tablet by mouth daily.   Yes [provider]  dutasteride (AVODART) 0.5 MG capsule Take 1 capsule (0.5 mg total) by mouth daily. Patient taking differently: Take 0.5 mg by mouth every evening.  10/21/15  Yes Silverio Decamp, MD  enoxaparin (LOVENOX) 30 MG/0.3ML injection Inject 0.3 mLs (30 mg total) into the skin every 12 (twelve) hours. 10/16/16  Yes Silverio Decamp, MD   ibuprofen (ADVIL,MOTRIN) 200 MG tablet Take 600-800 mg by mouth 3 (three) times daily as needed for headache or moderate pain.   Yes [provider]  Omega-3 Fatty Acids (FISH OIL) 1000 MG CAPS Take 2,000 mg by mouth daily.   Yes [provider]  rizatriptan (MAXALT-MLT) 10 MG disintegrating tablet Take 1 tablet (10 mg total) by mouth as needed for migraine. May repeat in 2 hours if needed 07/22/15  Yes Silverio Decamp, MD  tamsulosin (FLOMAX) 0.4 MG CAPS capsule TAKE 2 CAPSULES BY MOUTH EVERY DAY 10/24/16  Yes Silverio Decamp, MD  TURMERIC PO Take 1,000 mg by mouth daily.   Yes [provider]  warfarin (COUMADIN) 1 MG tablet For use up to 3 times per day Patient taking differently: Take 3 mg by mouth as directed. Take 3mg  (3 tablets) with a 5mg  tablet (8mg  total) once daily on Mon, Wed, Thur, Sat, and Sun.  On Tues and Fri take 4mg  (4 tablets) once daily 03/22/16  Yes Silverio Decamp, MD  warfarin (COUMADIN) 5 MG tablet TAKE 1 TABLET (5 MG TOTAL) BY MOUTH AS DIRECTED. 10/04/16  Yes Silverio Decamp, MD  levofloxacin (LEVAQUIN) 750 MG tablet Take 1 tablet (750 mg total) by mouth daily. 10/04/16   Silverio Decamp, MD     Social History  Alcohol use Moderate alcohol use. Caffeine use Coffee. No drug use Tobacco use Never smoker.  Family History Arthritis Mother. Cancer Mother. Depression Brother, Sister. Heart Disease Father.  Other Problems  Cancer General anesthesia - complications Heart murmur Hemorrhoids Inguinal Hernia Kidney Stone Migraine Headache    Physical Exam  The  physical exam findings are as follows: Note:GENERAL APPEARANCE: WDWN in NAD. Pleasant and cooperative.  EARS, NOSE, MOUTH THROAT: [place]/AT external ears: no lesions or deformities external nose: no lesions or deformities hearing: grossly normal lips: moist, no deformities EYES external: conjunctiva, lids, sclerae  normal pupils: equal, round glasses: no  CV ascultation: RRR, extremity edema: no  RESP/CHEST auscultation: breath sounds equal and clear respiratory effort: normal  GASTROINTESTINAL abdomen: Soft, non-tender, non-distended, no masses liver and spleen: not enlarged. hernia: Reducible, moderate sized right inguinal hernia scar: Right lower quadrant scar  GENITOURINARY scrotum: no masses penis: no lesions  MUSCULOSKELETAL station and gait: normal digits/nails: no clubbing or cyanosis  SKIN jaundice: none  NEUROLOGIC speech: normal  PSYCHIATRIC alertness and orientation: normal mood/affect/behavior: normal judgement and insight: normal    Assessment & Plan  INGUINAL HERNIA OF RIGHT SIDE WITHOUT OBSTRUCTION OR GANGRENE (K40.90) Impression: This is a moderate size with some symptomatology. Also appears to be getting larger. He is on chronic anticoagulation. Plan: We discussed open right inguinal hernia repair with mesh and he is interested in getting this done. We'll need to coordinate his anticoagulation bridging with his primary care physician like he did with his joint replacement.  I have explained the procedure, risks, and aftercare of inguinal hernia repair. Risks include but are not limited to bleeding, infection, wound problems, anesthesia, recurrence, bladder or intestine injury, urinary retention, testicular dysfunction, chronic pain, mesh problems. He seems to understand and agrees with the plan.  Jackolyn Confer, M.D.

## 2016-10-26 NOTE — Anesthesia Procedure Notes (Signed)
Procedure Name: LMA Insertion Date/Time: 10/26/2016 7:49 AM Performed by: Garrison Columbus T Pre-anesthesia Checklist: Patient identified, Emergency Drugs available, Suction available and Patient being monitored Patient Re-evaluated:Patient Re-evaluated prior to induction Oxygen Delivery Method: Circle System Utilized Preoxygenation: Pre-oxygenation with 100% oxygen Induction Type: IV induction LMA: LMA inserted LMA Size: 5.0 Number of attempts: 1 Airway Equipment and Method: Bite block Placement Confirmation: positive ETCO2 Tube secured with: Tape Dental Injury: Teeth and Oropharynx as per pre-operative assessment

## 2016-10-26 NOTE — Op Note (Signed)
OPERATIVE NOTE-INGUINAL HERNIA REPAIR  Preoperative diagnosis:  Right inguinal hernia.  Postoperative diagnosis:  Same  Procedure:  Right inguinal hernia repair with mesh.  Surgeon:  Jackolyn Confer, M.D.  Anesthesia:  General/LMA with local (Marcaine).  Indication:  This is a 61 year old male with a symptomatic right inguinal hernia who presents for repair.  EBL < 100 ml  Technique:  He was seen in the holding room and the right groin was marked with my initials. He was brought to the operating, placed supine on the operating table, and the anesthetic was administered by the anesthesiologist. The hair in the groin area was clipped as was felt to be necessary. This area was then sterilely prepped and draped. A timeout was performed.  Local anesthetic was infiltrated in the superficial and deep tissues in the right groin.  An incision was made through the skin and subcutaneous tissue until the external oblique aponeurosis was identified.  Local anesthetic was infiltrated deep to the external oblique aponeurosis. The external oblique aponeurosis was divided through the external ring medially and back toward the anterior superior iliac spine laterally. Using blunt dissection, the shelving edge of the inguinal ligament was identified inferiorly and the internal oblique aponeurosis and muscle were identified superiorly. The ilioinguinal nerve was identified and preserved.  The spermatic cord was isolated and a posterior window was made around it. A indirect hernia sac was identified and separated from the spermatic cord using blunt dissection. The hernia sac and its contents were reduced through the indirect hernia defect.   A piece of 6" x 6" polypropylene mesh was brought into the field, cut to 4" x 6", and anchored 1-2 cm medial to the pubic tubercle with 2-0 Prolene suture. The inferior aspect of the mesh was anchored to the shelving edge of the inguinal ligament with running 2-0 Prolene suture  to a level 1-2 cm lateral to the internal ring. A slit was cut in the mesh creating 2 tails. These were wrapped around the spermatic cord. The superior aspect of the mesh was anchored to the internal oblique aponeurosis and muscle with interrupted 2-0 Vicryl sutures. The 2 tails of the mesh were then crossed creating a new internal ring and were anchored to the shelving edge of the inguinal ligament with 2-0 Prolene suture. The tip of a hemostat could be placed through the new aperture. The lateral aspect of the mesh was then tucked deep to the external oblique aponeurosis.  The wound was inspected and hemostasis was adequate. The external oblique aponeurosis was then closed over the mesh and cord with running 3-0 Vicryl suture. The subcutaneous tissue was closed with running 3-0 Vicryl suture. The skin closed with a running 4-0 Monocryl subcuticular stitch.  Steri-Strips and a sterile dressing were applied.  The procedure was well-tolerated without any apparent complications and he was taken to the recovery room in satisfactory condition.

## 2016-10-29 ENCOUNTER — Encounter (HOSPITAL_COMMUNITY): Payer: Self-pay | Admitting: General Surgery

## 2016-11-01 ENCOUNTER — Ambulatory Visit (INDEPENDENT_AMBULATORY_CARE_PROVIDER_SITE_OTHER): Payer: BLUE CROSS/BLUE SHIELD | Admitting: Sports Medicine

## 2016-11-01 DIAGNOSIS — Z952 Presence of prosthetic heart valve: Secondary | ICD-10-CM

## 2016-11-01 LAB — POCT INR: INR: 2.8

## 2016-11-01 NOTE — Progress Notes (Signed)
Pt advised,verbalized understanding. 

## 2016-11-02 ENCOUNTER — Ambulatory Visit: Payer: BLUE CROSS/BLUE SHIELD

## 2016-11-15 ENCOUNTER — Other Ambulatory Visit: Payer: Self-pay | Admitting: Sports Medicine

## 2016-11-15 DIAGNOSIS — N400 Enlarged prostate without lower urinary tract symptoms: Secondary | ICD-10-CM

## 2016-11-22 MED ORDER — WARFARIN SODIUM 5 MG PO TABS: 5.0000 mg | ORAL_TABLET | ORAL | 5 refills | Status: DC

## 2017-02-01 ENCOUNTER — Ambulatory Visit (INDEPENDENT_AMBULATORY_CARE_PROVIDER_SITE_OTHER): Payer: BLUE CROSS/BLUE SHIELD | Admitting: Sports Medicine

## 2017-02-01 DIAGNOSIS — Z23 Encounter for immunization: Secondary | ICD-10-CM | POA: Diagnosis not present

## 2017-02-01 DIAGNOSIS — Z952 Presence of prosthetic heart valve: Secondary | ICD-10-CM | POA: Diagnosis not present

## 2017-02-01 LAB — POCT INR: INR: 3.6

## 2017-02-01 NOTE — Progress Notes (Signed)
Patient is here for INR check. Of note he did start himself on left over antibiotic he had because he felt he had a UTI. 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 Offered to obtain a urine specimen today to send for culture and the patient declined.

## 2017-02-04 ENCOUNTER — Telehealth: Payer: Self-pay | Admitting: *Deleted

## 2017-02-04 NOTE — Telephone Encounter (Signed)
-----   Message from Silverio Decamp, MD sent at 02/01/2017 10:01 AM EST ----- INR is slightly high, but close to the acceptable range.  No changes for now. 8 mg daily except 4mg  on Tuesdays, Fridays. Recheck at home in 2 weeks, and if INR therapeutic at 2.5-3.5 then he can continue current dosage. Currently target range reported incorrectly in the chart, I have corrected it.

## 2017-02-04 NOTE — Telephone Encounter (Signed)
Message left on vm 

## 2017-03-01 ENCOUNTER — Ambulatory Visit: Payer: BLUE CROSS/BLUE SHIELD

## 2017-03-01 ENCOUNTER — Ambulatory Visit (INDEPENDENT_AMBULATORY_CARE_PROVIDER_SITE_OTHER): Payer: BLUE CROSS/BLUE SHIELD | Admitting: Sports Medicine

## 2017-03-01 DIAGNOSIS — Z952 Presence of prosthetic heart valve: Secondary | ICD-10-CM | POA: Diagnosis not present

## 2017-03-01 LAB — POCT INR: INR: 4

## 2017-03-01 NOTE — Progress Notes (Unsigned)
   Subjective:    Patient ID: Zachary Calderon, male    DOB: 01-28-1956, 62 y.o.   MRN: [id number]  HPI Patient here for INR. He no longer has home machine and will be scheduled here for testing per Dr.T. He has just completed Sulfa rx for UTI.    Review of Systems     Objective:   Physical Exam        Assessment & Plan:

## 2017-03-01 NOTE — Progress Notes (Signed)
INR is still slightly high, but close to the acceptable range.  Decreasing dose, 8 mg daily except 4mg  on Tuesdays, Thursdays, and Saturdays. Recheck at home in 2 weeks, and if INR therapeutic at 2.5-3.5 then he can continue new dosage.

## 2017-03-15 ENCOUNTER — Other Ambulatory Visit: Payer: Self-pay | Admitting: Sports Medicine

## 2017-04-16 ENCOUNTER — Other Ambulatory Visit: Payer: Self-pay | Admitting: Sports Medicine

## 2017-04-16 DIAGNOSIS — N400 Enlarged prostate without lower urinary tract symptoms: Secondary | ICD-10-CM

## 2017-05-07 ENCOUNTER — Encounter: Payer: Self-pay | Admitting: Sports Medicine

## 2017-05-08 MED ORDER — WARFARIN SODIUM 1 MG PO TABS: ORAL_TABLET | ORAL | 11 refills | Status: DC

## 2017-05-15 ENCOUNTER — Other Ambulatory Visit: Payer: Self-pay | Admitting: Sports Medicine

## 2017-05-15 DIAGNOSIS — N400 Enlarged prostate without lower urinary tract symptoms: Secondary | ICD-10-CM

## 2017-06-06 ENCOUNTER — Other Ambulatory Visit: Payer: Self-pay | Admitting: Sports Medicine

## 2017-06-06 ENCOUNTER — Encounter: Payer: Self-pay | Admitting: Sports Medicine

## 2017-06-06 DIAGNOSIS — N400 Enlarged prostate without lower urinary tract symptoms: Secondary | ICD-10-CM

## 2017-06-10 ENCOUNTER — Telehealth: Payer: Self-pay | Admitting: Sports Medicine

## 2017-06-10 NOTE — Telephone Encounter (Signed)
INR, medial epicondylitis at left elbow & some dermatologic issues, this appointment was scheduled via Mychart,  is it  okay to leave appt as is in ac"15 min" time slot?

## 2017-06-11 ENCOUNTER — Encounter: Payer: Self-pay | Admitting: Sports Medicine

## 2017-06-11 NOTE — Telephone Encounter (Signed)
Yes ma'am. Thanks for asking.

## 2017-06-12 NOTE — Telephone Encounter (Signed)
Thank you :)

## 2017-06-13 ENCOUNTER — Encounter: Payer: BLUE CROSS/BLUE SHIELD | Admitting: Sports Medicine

## 2017-06-13 MED ORDER — TAMSULOSIN HCL 0.4 MG PO CAPS: 0.8000 mg | ORAL_CAPSULE | Freq: Every day | ORAL | 0 refills | Status: DC

## 2017-06-14 ENCOUNTER — Encounter: Payer: Self-pay | Admitting: Sports Medicine

## 2017-06-14 ENCOUNTER — Ambulatory Visit (INDEPENDENT_AMBULATORY_CARE_PROVIDER_SITE_OTHER): Payer: BLUE CROSS/BLUE SHIELD

## 2017-06-14 ENCOUNTER — Ambulatory Visit: Payer: BLUE CROSS/BLUE SHIELD | Admitting: Physician Assistant

## 2017-06-14 ENCOUNTER — Ambulatory Visit (INDEPENDENT_AMBULATORY_CARE_PROVIDER_SITE_OTHER): Payer: BLUE CROSS/BLUE SHIELD | Admitting: Sports Medicine

## 2017-06-14 VITALS — BP 121/73 | HR 79 | Resp 16 | Wt 188.0 lb

## 2017-06-14 DIAGNOSIS — R05 Cough: Secondary | ICD-10-CM

## 2017-06-14 DIAGNOSIS — Z7901 Long term (current) use of anticoagulants: Secondary | ICD-10-CM

## 2017-06-14 DIAGNOSIS — R058 Other specified cough: Secondary | ICD-10-CM | POA: Insufficient documentation

## 2017-06-14 DIAGNOSIS — H6123 Impacted cerumen, bilateral: Secondary | ICD-10-CM | POA: Diagnosis not present

## 2017-06-14 LAB — POCT INR: INR: 2.9

## 2017-06-14 MED ORDER — BENZONATATE 200 MG PO CAPS: 200.0000 mg | ORAL_CAPSULE | Freq: Three times a day (TID) | ORAL | 0 refills | Status: DC | PRN

## 2017-06-14 NOTE — Addendum Note (Signed)
Addended by: Elizabeth Sauer on: 06/14/2017 03:22 PM   Modules accepted: Orders

## 2017-06-14 NOTE — Assessment & Plan Note (Signed)
With history of pneumonia adding a chest x-ray, Tessalon Perles. There were bilateral cerumen impactions, these were irrigated out as Arnolds reflex can result in a cough. Return to see me in 2 weeks as needed.

## 2017-06-14 NOTE — Addendum Note (Signed)
Addended by: Silverio Decamp on: 06/14/2017 03:02 PM   Modules accepted: Level of Service

## 2017-06-14 NOTE — Progress Notes (Addendum)
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  No shortness of breath, no chest pain.  No hemoptysis.  No weight loss.  I reviewed the past medical history, family history, social history, surgical history, and allergies today and no changes were needed.  Please see the problem list section below in epic for further details.  Past Medical History: Past Medical History:  Diagnosis Date  . Arthritis    R knee- , not symptomatic but reports DJD  . Baker's cyst    right leg   . Bladder infection, chronic    post op - knee replacement, has  had 3 rounds of antibiotics   . BPH (benign prostatic hypertrophy)   . Cancer ([place])    basal cell carcinoma - abd  . Chronic anticoagulation   . Endocarditis   . Family history of hiatal hernia   . Headache    migraines  . Heart murmur   . Hemorrhoids   . History of kidney stones 2018   2 stones- /w CAT scan, not yet passed   . Hyperlipidemia   . MVP (mitral valve prolapse)   . Pneumonia 2017   post op after - R TKR  . PONV (postoperative nausea and vomiting)    with dizziness   . Proctitis   . S/P MVR (mitral valve replacement)   . Tinnitus   . Urinary frequency   . Weak urinary stream    Past Surgical History: Past Surgical History:  Procedure Laterality Date  . APPENDECTOMY  1970  . INGUINAL HERNIA REPAIR Right 10/26/2016   Procedure: RIGHT INGUINAL HERNIA REPAIR WITH MESH;  Surgeon: Jackolyn Confer, MD;  Location: [place];  Service: General;  Laterality: Right;  GENERAL AND LMA   . INSERTION OF MESH Right 10/26/2016   Procedure: INSERTION OF MESH;  Surgeon: Jackolyn Confer, MD;  Location: [place];  Service: General;  Laterality: Right;  . MITRAL VALVE REPAIR    . MITRAL VALVE REPLACEMENT    . right knee arthroscopy     times 2  . TONSILLECTOMY    .  TOTAL KNEE ARTHROPLASTY Right 11/07/2015   Procedure: RIGHT TOTAL KNEE ARTHROPLASTY;  Surgeon: Gaynelle Arabian, MD;  Location: [facility];  Service: Orthopedics;  Laterality: Right;   Social History: Social History   Socioeconomic History  . Marital status: Married    Spouse name: Not on file  . Number of children: Not on file  . Years of education: Not on file  . Highest education level: Not on file  Occupational History  . Not on file  Social Needs  . Financial resource strain: Not on file  . Food insecurity:    Worry: Not on file    Inability: Not on file  . Transportation needs:    Medical: Not on file    Non-medical: Not on file  Tobacco Use  . Smoking status: Never Smoker  . Smokeless tobacco: Never Used  Substance and Sexual Activity  . Alcohol use: Yes    Comment: 1 drink 5 times per week   . Drug use: No  . Sexual activity: Yes  Lifestyle  . Physical activity:    Days per week: Not on file    Minutes per session: Not on file  . Stress: Not on file  Relationships  . Social connections:  Talks on phone: Not on file    Gets together: Not on file    Attends religious service: Not on file    Active member of club or organization: Not on file    Attends meetings of clubs or organizations: Not on file    Relationship status: Not on file  Other Topics Concern  . Not on file  Social History Narrative  . Not on file   Family History: Family History  Problem Relation Age of Onset  . Hypertension Mother   . Leukemia Mother    Allergies: Allergies  Allergen Reactions  . Benzonatate Other (See Comments)    Headaches   . Other Nausea And Vomiting    general anesthesia    . Cephalosporins Hives and Itching   Medications: See med rec.  Review of Systems: No fevers, chills, night sweats, weight loss, chest pain, or shortness of breath.   Objective:    General: Well Developed, well nourished, and in no acute distress.  Neuro: Alert and oriented x3, extra-ocular  muscles intact, sensation grossly intact.  HEENT: Normocephalic, atraumatic, pupils equal round reactive to light, neck supple, no masses, no lymphadenopathy, thyroid nonpalpable.  Bilateral cerumen impactions Skin: Warm and dry, no rashes. Cardiac: Regular rate and rhythm, no murmurs rubs or gallops, no lower extremity edema.  Respiratory: Clear to auscultation bilaterally. Not using accessory muscles, speaking in full sentences.  Impression and Recommendations:    Post-viral cough syndrome With history of pneumonia adding a chest x-ray, Tessalon Perles. There were bilateral cerumen impactions, these were irrigated out as Arnolds reflex can result in a cough. Return to see me in 2 weeks as needed.  I spent 40 minutes with this patient, greater than 50% was face-to-face time counseling regarding the above diagnoses ___________________________________________ Gwen Her. Dianah Field, M.D., ABFM., CAQSM. Primary Care and [place] Instructor of [place] [facility] of Medicine

## 2017-06-20 ENCOUNTER — Ambulatory Visit (INDEPENDENT_AMBULATORY_CARE_PROVIDER_SITE_OTHER): Payer: BLUE CROSS/BLUE SHIELD | Admitting: Sports Medicine

## 2017-06-20 DIAGNOSIS — N401 Enlarged prostate with lower urinary tract symptoms: Secondary | ICD-10-CM

## 2017-06-20 DIAGNOSIS — L918 Other hypertrophic disorders of the skin: Secondary | ICD-10-CM

## 2017-06-20 DIAGNOSIS — R350 Frequency of micturition: Secondary | ICD-10-CM

## 2017-06-20 DIAGNOSIS — M7702 Medial epicondylitis, left elbow: Secondary | ICD-10-CM

## 2017-06-20 DIAGNOSIS — Z0000 Encounter for general adult medical examination without abnormal findings: Secondary | ICD-10-CM

## 2017-06-20 DIAGNOSIS — E7800 Pure hypercholesterolemia, unspecified: Secondary | ICD-10-CM | POA: Diagnosis not present

## 2017-06-20 LAB — CBC
HCT: 45.4 % (ref 38.5–50.0)
Hemoglobin: 15.7 g/dL (ref 13.2–17.1)
MCH: 30.4 pg (ref 27.0–33.0)
MCHC: 34.6 g/dL (ref 32.0–36.0)
MCV: 88 fL (ref 80.0–100.0)
MPV: 10.3 fL (ref 7.5–12.5)
Platelets: 190 Thousand/uL (ref 140–400)
RBC: 5.16 Million/uL (ref 4.20–5.80)
RDW: 12.8 % (ref 11.0–15.0)
WBC: 6.1 Thousand/uL (ref 3.8–10.8)

## 2017-06-20 LAB — LIPID PANEL W/REFLEX DIRECT LDL
Cholesterol: 234 mg/dL — ABNORMAL HIGH (ref <200)
HDL: 49 mg/dL (ref >40)
LDL Cholesterol (Calc): 165 mg/dL — ABNORMAL HIGH
Non-HDL Cholesterol (Calc): 185 mg/dL — ABNORMAL HIGH (ref <130)
Total CHOL/HDL Ratio: 4.8 (calc) (ref <5.0)
Triglycerides: 96 mg/dL (ref <150)

## 2017-06-20 LAB — COMPREHENSIVE METABOLIC PANEL
AG Ratio: 2.4 (calc) (ref 1.0–2.5)
ALT: 15 U/L (ref 9–46)
Alkaline phosphatase (APISO): 61 U/L (ref 40–115)
BUN: 17 mg/dL (ref 7–25)
CO2: 30 mmol/L (ref 20–32)
Calcium: 9.5 mg/dL (ref 8.6–10.3)
Sodium: 139 mmol/L (ref 135–146)
Total Bilirubin: 1.5 mg/dL — ABNORMAL HIGH (ref 0.2–1.2)
Total Protein: 6.5 g/dL (ref 6.1–8.1)

## 2017-06-20 LAB — TSH: TSH: 0.92 mIU/L (ref 0.40–4.50)

## 2017-06-20 LAB — COMPREHENSIVE METABOLIC PANEL WITH GFR
AST: 23 U/L (ref 10–35)
Albumin: 4.6 g/dL (ref 3.6–5.1)
Chloride: 104 mmol/L (ref 98–110)
Creat: 1.05 mg/dL (ref 0.70–1.25)
Globulin: 1.9 g/dL (ref 1.9–3.7)
Glucose, Bld: 96 mg/dL (ref 65–99)
Potassium: 4.8 mmol/L (ref 3.5–5.3)

## 2017-06-20 NOTE — Assessment & Plan Note (Signed)
Recheck lipids

## 2017-06-20 NOTE — Progress Notes (Signed)
Subjective:    CC: Annual physical exam.   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  I reviewed the past medical history, family history, social history, surgical history, and allergies today and no changes were needed.  Please see the problem list section below in epic for further details.  Past Medical History: Past Medical History:  Diagnosis Date  . Arthritis    R knee- , not symptomatic but reports DJD  . Baker's cyst    right leg   . Bladder infection, chronic    post op - knee replacement, has  had 3 rounds of antibiotics   . BPH (benign prostatic hypertrophy)   . Cancer ([place])    basal cell carcinoma - abd  . Chronic anticoagulation   . Endocarditis   . Family history of hiatal hernia   . Headache    migraines  . Heart murmur   . Hemorrhoids   . History of kidney stones 2018   2 stones- /w CAT scan, not yet passed   . Hyperlipidemia   . MVP (mitral valve prolapse)   . Pneumonia 2017   post op after - R TKR  . PONV (postoperative nausea and vomiting)    with dizziness   . Proctitis   . S/P MVR (mitral valve replacement)   . Tinnitus   . Urinary frequency   . Weak urinary stream    Past Surgical History: Past Surgical History:  Procedure Laterality Date  . APPENDECTOMY  1970  . INGUINAL HERNIA REPAIR Right 10/26/2016   Procedure: RIGHT INGUINAL HERNIA REPAIR WITH MESH;  Surgeon: Jackolyn Confer, MD;  Location: [place];  Service: General;  Laterality: Right;  GENERAL AND LMA   . INSERTION OF MESH Right 10/26/2016   Procedure: INSERTION OF MESH;  Surgeon: Jackolyn Confer, MD;  Location: [place];  Service: General;  Laterality: Right;  . MITRAL VALVE REPAIR    . MITRAL VALVE REPLACEMENT    . right knee arthroscopy     times 2  . TONSILLECTOMY    . TOTAL KNEE ARTHROPLASTY  Right 11/07/2015   Procedure: RIGHT TOTAL KNEE ARTHROPLASTY;  Surgeon: Gaynelle Arabian, MD;  Location: [facility];  Service: Orthopedics;  Laterality: Right;   Social History: Social History   Socioeconomic History  . Marital status: Married    Spouse name: Not on file  . Number of children: Not on file  . Years of education: Not on file  . Highest education level: Not on file  Occupational History  . Not on file  Social Needs  . Financial resource strain: Not on file  . Food insecurity:    Worry: Not on file    Inability: Not on file  . Transportation needs:    Medical: Not on file    Non-medical: Not on file  Tobacco Use  . Smoking status: Never Smoker  . Smokeless tobacco: Never Used  Substance and Sexual Activity  . Alcohol use: Yes    Comment: 1 drink 5 times per week   . Drug use: No  . Sexual activity: Yes  Lifestyle  . Physical activity:    Days per week: Not on file    Minutes per session: Not on file  . Stress: Not on file  Relationships  . Social connections:  Talks on phone: Not on file    Gets together: Not on file    Attends religious service: Not on file    Active member of club or organization: Not on file    Attends meetings of clubs or organizations: Not on file    Relationship status: Not on file  Other Topics Concern  . Not on file  Social History Narrative  . Not on file   Family History: Family History  Problem Relation Age of Onset  . Hypertension Mother   . Leukemia Mother    Allergies: Allergies  Allergen Reactions  . Benzonatate Other (See Comments)    Headaches   . Other Nausea And Vomiting    general anesthesia    . Cephalosporins Hives and Itching   Medications: See med rec.  Review of Systems: No headache, visual changes, nausea, vomiting, diarrhea, constipation, dizziness, abdominal pain, skin rash, fevers, chills, night sweats, swollen lymph nodes, weight loss, chest pain, body aches, joint swelling, muscle aches, shortness  of breath, mood changes, visual or auditory hallucinations.  Objective:    General: Well Developed, well nourished, and in no acute distress.  Neuro: Alert and oriented x3, extra-ocular muscles intact, sensation grossly intact. Cranial nerves II through XII are intact, motor, sensory, and coordinative functions are all intact. HEENT: Normocephalic, atraumatic, pupils equal round reactive to light, neck supple, no masses, no lymphadenopathy, thyroid nonpalpable. Oropharynx, nasopharynx, external ear canals are unremarkable. Skin: Warm and dry, no rashes noted.  Changes consistent with venous stasis noted on both legs, significant venous varicosities bilaterally.  Several skin tags in the axillae Cardiac: Regular rate and rhythm, no murmurs rubs or gallops.  Respiratory: Clear to auscultation bilaterally. Not using accessory muscles, speaking in full sentences.  Abdominal: Soft, nontender, nondistended, positive bowel sounds, no masses, no organomegaly.  Left elbow: Unremarkable to inspection. Range of motion full pronation, supination, flexion, extension. Strength is full to all of the above directions Stable to varus, valgus stress. Negative moving valgus stress test. Tender to palpation at the common flexor tendon origin Ulnar nerve does not sublux. Negative cubital tunnel Tinel's.  Procedure:  Cryodestruction of #9 skin tags in the axillae and abdomen Consent obtained and verified. Time-out conducted. Noted no overlying erythema, induration, or other signs of local infection. Completed without difficulty using Cryo-Gun. Advised to call if fevers/chills, erythema, induration, drainage, or persistent bleeding.  Impression and Recommendations:    The patient was counselled, risk factors were discussed, anticipatory guidance given.  Annual physical exam Routine physical as above, checking routine labs. Up-to-date on screening measures.  Skin tag Cryotherapy as  above.  Hyperlipidemia Recheck lipids.  Medial epicondylitis, left Topical Pennsaid (diclofenac 2% topical), rehab exercises, return in a month, injection if no better.  BPH (benign prostatic hyperplasia) Rechecking PSA  ___________________________________________ Gwen Her. Dianah Field, M.D., ABFM., CAQSM. Primary Care and [place] Instructor of [place] [facility] of Medicine

## 2017-06-20 NOTE — Assessment & Plan Note (Signed)
Routine physical as above, checking routine labs. Up-to-date on screening measures.

## 2017-06-20 NOTE — Assessment & Plan Note (Signed)
Cryotherapy as above. 

## 2017-06-20 NOTE — Assessment & Plan Note (Signed)
Rechecking PSA.

## 2017-06-20 NOTE — Assessment & Plan Note (Signed)
Topical Pennsaid (diclofenac 2% topical), rehab exercises, return in a month, injection if no better.

## 2017-06-21 ENCOUNTER — Encounter: Payer: Self-pay | Admitting: Sports Medicine

## 2017-06-21 ENCOUNTER — Encounter: Payer: BLUE CROSS/BLUE SHIELD | Admitting: Sports Medicine

## 2017-06-21 LAB — PSA, TOTAL AND FREE
PSA, % Free: 14 % (calc) — ABNORMAL LOW (ref >25)
PSA, Free: 0.1 ng/mL
PSA, Total: 0.7 ng/mL (ref <=4.0)

## 2017-06-21 LAB — HEMOGLOBIN A1C
Hgb A1c MFr Bld: 5 % of total Hgb (ref <5.7)
Mean Plasma Glucose: 97 (calc)
eAG (mmol/L): 5.4 (calc)

## 2017-06-21 MED ORDER — DICLOFENAC SODIUM 2 % TD SOLN: 2.0000 | Freq: Two times a day (BID) | TRANSDERMAL | 11 refills | Status: DC

## 2017-06-29 ENCOUNTER — Other Ambulatory Visit: Payer: Self-pay | Admitting: Sports Medicine

## 2017-06-29 DIAGNOSIS — N400 Enlarged prostate without lower urinary tract symptoms: Secondary | ICD-10-CM

## 2017-07-26 ENCOUNTER — Encounter: Payer: Self-pay | Admitting: Sports Medicine

## 2017-07-26 ENCOUNTER — Ambulatory Visit (INDEPENDENT_AMBULATORY_CARE_PROVIDER_SITE_OTHER): Payer: BLUE CROSS/BLUE SHIELD | Admitting: Sports Medicine

## 2017-07-26 DIAGNOSIS — M7702 Medial epicondylitis, left elbow: Secondary | ICD-10-CM

## 2017-07-26 DIAGNOSIS — Z952 Presence of prosthetic heart valve: Secondary | ICD-10-CM

## 2017-07-26 DIAGNOSIS — L918 Other hypertrophic disorders of the skin: Secondary | ICD-10-CM

## 2017-07-26 DIAGNOSIS — Z7901 Long term (current) use of anticoagulants: Secondary | ICD-10-CM | POA: Diagnosis not present

## 2017-07-26 LAB — POCT INR: INR: 3.9 — AB (ref 2.0–3.0)

## 2017-07-26 MED ORDER — PREDNISONE 50 MG PO TABS: ORAL_TABLET | ORAL | 0 refills | Status: DC

## 2017-07-26 NOTE — Patient Instructions (Signed)
New regimen, 8 mg 4 days/week and 4 mg 3 days/week.

## 2017-07-26 NOTE — Assessment & Plan Note (Signed)
INR is a bit high today. Decreasing dosing slightly, recheck in 1 week. New regimen, 8 mg 4 days/week and 4 mg 3 days/week.

## 2017-07-26 NOTE — Progress Notes (Signed)
Subjective:    CC: Follow-up  HPI: Skin tags: Most are completely gone now after cryotherapy at the last visit, he has 3 remaining skin tags, 2 in the left axilla and one in the right axilla.  Mechanical aortic valve: INR checked today, INR was elevated at 3.9.  No bleeding.  Left elbow pain: Worsening, medial aspect, when doing thoracic manipulations and having to flex his elbow and lift he feels pain at the medial epicondyle.  Exercises and compression sleeve have not been effective.  He does desire a prednisone burst today.  We have never done an injection.  I reviewed the past medical history, family history, social history, surgical history, and allergies today and no changes were needed.  Please see the problem list section below in epic for further details.  Past Medical History: Past Medical History:  Diagnosis Date  . Arthritis    R knee- , not symptomatic but reports DJD  . Baker's cyst    right leg   . Bladder infection, chronic    post op - knee replacement, has  had 3 rounds of antibiotics   . BPH (benign prostatic hypertrophy)   . Cancer ([place])    basal cell carcinoma - abd  . Chronic anticoagulation   . Endocarditis   . Family history of hiatal hernia   . Headache    migraines  . Heart murmur   . Hemorrhoids   . History of kidney stones 2018   2 stones- /w CAT scan, not yet passed   . Hyperlipidemia   . MVP (mitral valve prolapse)   . Pneumonia 2017   post op after - R TKR  . PONV (postoperative nausea and vomiting)    with dizziness   . Proctitis   . S/P MVR (mitral valve replacement)   . Tinnitus   . Urinary frequency   . Weak urinary stream    Past Surgical History: Past Surgical History:  Procedure Laterality Date  . APPENDECTOMY  1970  . INGUINAL HERNIA REPAIR Right 10/26/2016   Procedure: RIGHT INGUINAL HERNIA REPAIR WITH MESH;  Surgeon: Jackolyn Confer, MD;  Location: [place];  Service: General;  Laterality: Right;  GENERAL AND LMA   . INSERTION  OF MESH Right 10/26/2016   Procedure: INSERTION OF MESH;  Surgeon: Jackolyn Confer, MD;  Location: [place];  Service: General;  Laterality: Right;  . MITRAL VALVE REPAIR    . MITRAL VALVE REPLACEMENT    . right knee arthroscopy     times 2  . TONSILLECTOMY    . TOTAL KNEE ARTHROPLASTY Right 11/07/2015   Procedure: RIGHT TOTAL KNEE ARTHROPLASTY;  Surgeon: Gaynelle Arabian, MD;  Location: [facility];  Service: Orthopedics;  Laterality: Right;   Social History: Social History   Socioeconomic History  . Marital status: Married    Spouse name: Not on file  . Number of children: Not on file  . Years of education: Not on file  . Highest education level: Not on file  Occupational History  . Not on file  Social Needs  . Financial resource strain: Not on file  . Food insecurity:    Worry: Not on file    Inability: Not on file  . Transportation needs:    Medical: Not on file    Non-medical: Not on file  Tobacco Use  . Smoking status: Never Smoker  . Smokeless tobacco: Never Used  Substance and Sexual Activity  . Alcohol use: Yes    Comment: 1 drink 5  times per week   . Drug use: No  . Sexual activity: Yes  Lifestyle  . Physical activity:    Days per week: Not on file    Minutes per session: Not on file  . Stress: Not on file  Relationships  . Social connections:    Talks on phone: Not on file    Gets together: Not on file    Attends religious service: Not on file    Active member of club or organization: Not on file    Attends meetings of clubs or organizations: Not on file    Relationship status: Not on file  Other Topics Concern  . Not on file  Social History Narrative  . Not on file   Family History: Family History  Problem Relation Age of Onset  . Hypertension Mother   . Leukemia Mother    Allergies: Allergies  Allergen Reactions  . Benzonatate Other (See Comments)    Headaches   . Other Nausea And Vomiting    general anesthesia    . Cephalosporins Hives and Itching    Medications: See med rec.  Review of Systems: No fevers, chills, night sweats, weight loss, chest pain, or shortness of breath.   Objective:    General: Well Developed, well nourished, and in no acute distress.  Neuro: Alert and oriented x3, extra-ocular muscles intact, sensation grossly intact.  HEENT: Normocephalic, atraumatic, pupils equal round reactive to light, neck supple, no masses, no lymphadenopathy, thyroid nonpalpable.  Skin: Warm and dry, no rashes. Cardiac: Regular rate and rhythm, no murmurs rubs or gallops, audible aortic valve, no lower extremity edema.  Respiratory: Clear to auscultation bilaterally. Not using accessory muscles, speaking in full sentences.  Procedure:  Cryodestruction of #3 skin tags, one in the right axilla and 2 in the left axilla. Consent obtained and verified. Time-out conducted. Noted no overlying erythema, induration, or other signs of local infection. Completed without difficulty using Cryo-Gun. Advised to call if fevers/chills, erythema, induration, drainage, or persistent bleeding.  Impression and Recommendations:    Skin tag Did fantastic after prior skin tag cryotherapy, repeat cryotherapy on #3 skin tags in the axillae. Return as needed.  Medial epicondylitis, left Prednisone burst. Continue elbow sleeve, return for an injection if no better in a month.  Chronic anticoagulation INR is a bit high today. Decreasing dosing slightly, recheck in 1 week. New regimen, 8 mg 4 days/week and 4 mg 3 days/week.   ___________________________________________ Gwen Her. Dianah Field, M.D., ABFM., CAQSM. Primary Care and [place] Instructor of [place] [facility] of Medicine

## 2017-07-26 NOTE — Assessment & Plan Note (Signed)
Prednisone burst. Continue elbow sleeve, return for an injection if no better in a month.

## 2017-07-26 NOTE — Assessment & Plan Note (Signed)
Did fantastic after prior skin tag cryotherapy, repeat cryotherapy on #3 skin tags in the axillae. Return as needed.

## 2017-08-02 ENCOUNTER — Ambulatory Visit (INDEPENDENT_AMBULATORY_CARE_PROVIDER_SITE_OTHER): Payer: BLUE CROSS/BLUE SHIELD | Admitting: Sports Medicine

## 2017-08-02 DIAGNOSIS — Z952 Presence of prosthetic heart valve: Secondary | ICD-10-CM

## 2017-08-02 LAB — POCT INR: INR: 3.5 — AB (ref 2.0–3.0)

## 2017-08-02 NOTE — Progress Notes (Signed)
INR therapeutic.  Same dose, 8 mg on 4 days/week and 4 mg on 3 days/week, recheck in 4 weeks here.

## 2017-08-02 NOTE — Progress Notes (Signed)
Left message advising of recommendations.  

## 2017-08-04 ENCOUNTER — Encounter: Payer: Self-pay | Admitting: Sports Medicine

## 2017-08-05 MED ORDER — WARFARIN SODIUM 1 MG PO TABS: ORAL_TABLET | ORAL | 11 refills | Status: DC

## 2017-08-05 MED ORDER — WARFARIN SODIUM 5 MG PO TABS: 5.0000 mg | ORAL_TABLET | ORAL | 11 refills | Status: DC

## 2017-09-01 ENCOUNTER — Other Ambulatory Visit: Payer: Self-pay | Admitting: Sports Medicine

## 2017-09-01 DIAGNOSIS — N400 Enlarged prostate without lower urinary tract symptoms: Secondary | ICD-10-CM

## 2017-09-24 ENCOUNTER — Encounter: Payer: Self-pay | Admitting: Sports Medicine

## 2017-09-26 ENCOUNTER — Other Ambulatory Visit: Payer: Self-pay | Admitting: Sports Medicine

## 2017-09-26 DIAGNOSIS — N400 Enlarged prostate without lower urinary tract symptoms: Secondary | ICD-10-CM

## 2017-10-03 ENCOUNTER — Other Ambulatory Visit: Payer: Self-pay | Admitting: Sports Medicine

## 2017-10-03 ENCOUNTER — Encounter: Payer: Self-pay | Admitting: Sports Medicine

## 2017-10-03 DIAGNOSIS — N400 Enlarged prostate without lower urinary tract symptoms: Secondary | ICD-10-CM

## 2017-10-04 ENCOUNTER — Encounter: Payer: Self-pay | Admitting: Sports Medicine

## 2017-10-04 MED ORDER — SULFAMETHOXAZOLE-TRIMETHOPRIM 800-160 MG PO TABS: 1.0000 | ORAL_TABLET | Freq: Two times a day (BID) | ORAL | 0 refills | Status: DC

## 2017-10-06 ENCOUNTER — Encounter: Payer: Self-pay | Admitting: Sports Medicine

## 2017-10-11 ENCOUNTER — Ambulatory Visit (INDEPENDENT_AMBULATORY_CARE_PROVIDER_SITE_OTHER): Payer: BLUE CROSS/BLUE SHIELD | Admitting: Sports Medicine

## 2017-10-11 ENCOUNTER — Encounter: Payer: Self-pay | Admitting: Sports Medicine

## 2017-10-11 VITALS — BP 120/67 | HR 82 | Ht 72.0 in | Wt 185.0 lb

## 2017-10-11 DIAGNOSIS — Z7901 Long term (current) use of anticoagulants: Secondary | ICD-10-CM

## 2017-10-11 DIAGNOSIS — R350 Frequency of micturition: Secondary | ICD-10-CM

## 2017-10-11 DIAGNOSIS — N401 Enlarged prostate with lower urinary tract symptoms: Secondary | ICD-10-CM | POA: Diagnosis not present

## 2017-10-11 DIAGNOSIS — R300 Dysuria: Secondary | ICD-10-CM | POA: Diagnosis not present

## 2017-10-11 DIAGNOSIS — Z952 Presence of prosthetic heart valve: Secondary | ICD-10-CM | POA: Diagnosis not present

## 2017-10-11 LAB — POCT URINALYSIS DIPSTICK
Bilirubin, UA: NEGATIVE
Blood, UA: NEGATIVE
Glucose, UA: NEGATIVE
Ketones, UA: NEGATIVE
Leukocytes, UA: NEGATIVE
Nitrite, UA: NEGATIVE
Protein, UA: NEGATIVE
Spec Grav, UA: 1.025 (ref 1.010–1.025)
Urobilinogen, UA: 0.2 U/dL
pH, UA: 5.5 (ref 5.0–8.0)

## 2017-10-11 LAB — POCT INR: INR: 4.3 — AB (ref 2.0–3.0)

## 2017-10-11 NOTE — Assessment & Plan Note (Signed)
Supratherapeutic, decreasing down to 4 mg Coumadin on Saturdays, he will continue 4 mg on Mondays, Wednesdays, Fridays, and 8 mg of the days. Recheck in 2 weeks nurse visit.

## 2017-10-11 NOTE — Assessment & Plan Note (Signed)
Prostatitis, currently much better on Septra. Urinalysis was negative today. If he gets another 1 of these episodes he will deliver a urine specimen before starting antibiotics. Discontinue dutasteride, no improvement in symptoms after years of therapy.Marland Kitchen

## 2017-10-11 NOTE — Progress Notes (Signed)
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  More recently he developed some dysuria, urgency, frequency.  Incomplete emptying and dribbling.  We started Septra and he returns today with symptoms improving considerably.  Anticoagulation: History of mechanical heart valve, goal is 2.5-3.5, no bleeding, INR was 4.3 today.  I reviewed the past medical history, family history, social history, surgical history, and allergies today and no changes were needed.  Please see the problem list section below in epic for further details.  Past Medical History: Past Medical History:  Diagnosis Date  . Arthritis    R knee- , not symptomatic but reports DJD  . Baker's cyst    right leg   . Bladder infection, chronic    post op - knee replacement, has  had 3 rounds of antibiotics   . BPH (benign prostatic hypertrophy)   . Cancer ([place])    basal cell carcinoma - abd  . Chronic anticoagulation   . Endocarditis   . Family history of hiatal hernia   . Headache    migraines  . Heart murmur   . Hemorrhoids   . History of kidney stones 2018   2 stones- /w CAT scan, not yet passed   . Hyperlipidemia   . MVP (mitral valve prolapse)   . Pneumonia 2017   post op after - R TKR  . PONV (postoperative nausea and vomiting)    with dizziness   . Proctitis   . S/P MVR (mitral valve replacement)   . Tinnitus   . Urinary frequency   . Weak urinary stream    Past Surgical History: Past Surgical History:  Procedure Laterality Date  . APPENDECTOMY  1970  . INGUINAL HERNIA REPAIR Right 10/26/2016   Procedure: RIGHT INGUINAL HERNIA REPAIR WITH MESH;  Surgeon: Jackolyn Confer, MD;  Location: [facility];  Service: General;  Laterality: Right;  GENERAL AND LMA   . INSERTION OF MESH Right 10/26/2016   Procedure: INSERTION OF MESH;  Surgeon: Jackolyn Confer, MD;  Location: [facility];  Service: General;  Laterality: Right;  . MITRAL  VALVE REPAIR    . MITRAL VALVE REPLACEMENT    . right knee arthroscopy     times 2  . TONSILLECTOMY    . TOTAL KNEE ARTHROPLASTY Right 11/07/2015   Procedure: RIGHT TOTAL KNEE ARTHROPLASTY;  Surgeon: Gaynelle Arabian, MD;  Location: [facility];  Service: Orthopedics;  Laterality: Right;   Social History: Social History   Socioeconomic History  . Marital status: Married    Spouse name: Not on file  . Number of children: Not on file  . Years of education: Not on file  . Highest education level: Not on file  Occupational History  . Not on file  Social Needs  . Financial resource strain: Not on file  . Food insecurity:    Worry: Not on file    Inability: Not on file  . Transportation needs:    Medical: Not on file    Non-medical: Not on file  Tobacco Use  . Smoking status: Never Smoker  . Smokeless tobacco: Never Used  Substance and Sexual Activity  . Alcohol use: Yes    Comment: 1 drink 5 times per week   . Drug use: No  . Sexual activity: Yes  Lifestyle  . Physical activity:    Days per week: Not on file    Minutes per  session: Not on file  . Stress: Not on file  Relationships  . Social connections:    Talks on phone: Not on file    Gets together: Not on file    Attends religious service: Not on file    Active member of club or organization: Not on file    Attends meetings of clubs or organizations: Not on file    Relationship status: Not on file  Other Topics Concern  . Not on file  Social History Narrative  . Not on file   Family History: Family History  Problem Relation Age of Onset  . Hypertension Mother   . Leukemia Mother    Allergies: Allergies  Allergen Reactions  . Benzonatate Other (See Comments)    Headaches   . Other Nausea And Vomiting    general anesthesia    . Cephalosporins Hives and Itching   Medications: See med rec.  Review of Systems: No fevers, chills, night sweats, weight loss, chest pain, or shortness of breath.   Objective:     General: Well Developed, well nourished, and in no acute distress.  Neuro: Alert and oriented x3, extra-ocular muscles intact, sensation grossly intact.  HEENT: Normocephalic, atraumatic, pupils equal round reactive to light, neck supple, no masses, no lymphadenopathy, thyroid nonpalpable.  Skin: Warm and dry, no rashes. Cardiac: Regular rate and rhythm, no murmurs rubs or gallops, no lower extremity edema.  Respiratory: Clear to auscultation bilaterally. Not using accessory muscles, speaking in full sentences.  Urinalysis is completely clear.  Impression and Recommendations:    BPH (benign prostatic hyperplasia) Prostatitis, currently much better on Septra. Urinalysis was negative today. If he gets another 1 of these episodes he will deliver a urine specimen before starting antibiotics. Discontinue dutasteride, no improvement in symptoms after years of therapy..  Chronic anticoagulation Supratherapeutic, decreasing down to 4 mg Coumadin on Saturdays, he will continue 4 mg on Mondays, Wednesdays, Fridays, and 8 mg of the days. Recheck in 2 weeks nurse visit. ___________________________________________ Gwen Her. Dianah Field, M.D., ABFM., CAQSM. Primary Care and [place] Instructor of [place] of [facility] of Medicine

## 2017-10-25 ENCOUNTER — Ambulatory Visit (INDEPENDENT_AMBULATORY_CARE_PROVIDER_SITE_OTHER): Payer: BLUE CROSS/BLUE SHIELD | Admitting: Sports Medicine

## 2017-10-25 DIAGNOSIS — Z7901 Long term (current) use of anticoagulants: Secondary | ICD-10-CM

## 2017-10-25 DIAGNOSIS — Z952 Presence of prosthetic heart valve: Secondary | ICD-10-CM

## 2017-10-25 LAB — POCT INR: INR: 3.6 — AB (ref 2.0–3.0)

## 2017-10-25 NOTE — Progress Notes (Signed)
Patient advised of recommendations.  

## 2017-10-25 NOTE — Progress Notes (Signed)
Slightly supratherapeutic, no change in dose though, he will continue 4 mg on Mondays, Wednesdays, Fridays, Saturdays and 8 mg other days. Recheck in 2 weeks nurse visit.

## 2017-10-25 NOTE — Assessment & Plan Note (Signed)
Slightly supratherapeutic, no change in dose though, he will continue 4 mg on Mondays, Wednesdays, Fridays, Saturdays and 8 mg other days. Recheck in 2 weeks nurse visit.

## 2017-10-26 ENCOUNTER — Other Ambulatory Visit: Payer: Self-pay | Admitting: Sports Medicine

## 2017-10-26 ENCOUNTER — Encounter: Payer: Self-pay | Admitting: Sports Medicine

## 2017-10-29 ENCOUNTER — Other Ambulatory Visit: Payer: Self-pay | Admitting: Sports Medicine

## 2017-10-29 DIAGNOSIS — N400 Enlarged prostate without lower urinary tract symptoms: Secondary | ICD-10-CM

## 2017-11-08 ENCOUNTER — Ambulatory Visit (INDEPENDENT_AMBULATORY_CARE_PROVIDER_SITE_OTHER): Payer: BLUE CROSS/BLUE SHIELD | Admitting: Sports Medicine

## 2017-11-08 VITALS — BP 124/75 | HR 75 | Ht 72.0 in | Wt 185.0 lb

## 2017-11-08 DIAGNOSIS — Z952 Presence of prosthetic heart valve: Secondary | ICD-10-CM

## 2017-11-08 DIAGNOSIS — Z7901 Long term (current) use of anticoagulants: Secondary | ICD-10-CM | POA: Diagnosis not present

## 2017-11-08 LAB — POCT INR: INR: 3.6 — AB (ref 2.0–3.0)

## 2017-11-08 NOTE — Assessment & Plan Note (Signed)
Still slightly supratherapeutic, decreasing to 4 mg on Sunday. He will do 4 mg on Mondays, Wednesdays, Fridays, Saturdays, Sundays and 8 mg on Tuesday, Thursday. Recheck in 2 weeks nurse visit.

## 2017-11-08 NOTE — Progress Notes (Signed)
I called patient and gave him recommendations per Dr. Burman Foster and he voices understanding. He did not have any further questions at this time. Patient has nurse visit scheduled for 2 weeks out.

## 2017-11-08 NOTE — Progress Notes (Signed)
Patient presents to clinic for INR check. Patient denies any missed doses, diet changes, any bleeding gums, and did not notice any blood in his stool. He denies any hospitalizations since last visit. Patient reports he is on Bactrim and has taken 1 week of the 2 week prescription for a UTI.   He was advised that our office would call him with any changes to the dose and voices understanding. Last INR was 3.6.   Patient has been taking 4 mg on Monday, Wednesday, Friday and Saturday. He has been taking 8 mg the rest of the week. Please advise.

## 2017-11-18 ENCOUNTER — Encounter: Payer: Self-pay | Admitting: Sports Medicine

## 2017-11-19 ENCOUNTER — Encounter: Payer: Self-pay | Admitting: Sports Medicine

## 2017-11-19 ENCOUNTER — Other Ambulatory Visit: Payer: Self-pay | Admitting: Sports Medicine

## 2017-11-19 MED ORDER — WARFARIN SODIUM 1 MG PO TABS: ORAL_TABLET | ORAL | 11 refills | Status: DC

## 2017-11-20 ENCOUNTER — Ambulatory Visit (INDEPENDENT_AMBULATORY_CARE_PROVIDER_SITE_OTHER): Payer: BLUE CROSS/BLUE SHIELD | Admitting: Family Medicine

## 2017-11-20 DIAGNOSIS — Z952 Presence of prosthetic heart valve: Secondary | ICD-10-CM

## 2017-11-20 LAB — POCT INR: INR: 2.6 (ref 2.0–3.0)

## 2017-11-28 ENCOUNTER — Other Ambulatory Visit: Payer: Self-pay | Admitting: Sports Medicine

## 2017-11-28 DIAGNOSIS — N400 Enlarged prostate without lower urinary tract symptoms: Secondary | ICD-10-CM

## 2017-12-06 ENCOUNTER — Other Ambulatory Visit: Payer: Self-pay | Admitting: *Deleted

## 2017-12-06 ENCOUNTER — Other Ambulatory Visit: Payer: Self-pay | Admitting: Sports Medicine

## 2017-12-06 DIAGNOSIS — N400 Enlarged prostate without lower urinary tract symptoms: Secondary | ICD-10-CM

## 2017-12-06 MED ORDER — TAMSULOSIN HCL 0.4 MG PO CAPS: 0.4000 mg | ORAL_CAPSULE | Freq: Two times a day (BID) | ORAL | 0 refills | Status: DC

## 2017-12-06 NOTE — Progress Notes (Signed)
rx refilled.

## 2017-12-12 ENCOUNTER — Encounter: Payer: Self-pay | Admitting: Sports Medicine

## 2017-12-12 DIAGNOSIS — R350 Frequency of micturition: Principal | ICD-10-CM

## 2017-12-12 DIAGNOSIS — N401 Enlarged prostate with lower urinary tract symptoms: Secondary | ICD-10-CM

## 2017-12-13 MED ORDER — NITROFURANTOIN MONOHYD MACRO 100 MG PO CAPS: 100.0000 mg | ORAL_CAPSULE | Freq: Two times a day (BID) | ORAL | 0 refills | Status: DC

## 2017-12-16 LAB — URINALYSIS W MICROSCOPIC + REFLEX CULTURE
Bacteria, UA: NONE SEEN /HPF
Bilirubin Urine: NEGATIVE
Glucose, UA: NEGATIVE
Hyaline Cast: NONE SEEN /LPF
Ketones, ur: NEGATIVE
Nitrites, Initial: NEGATIVE
Specific Gravity, Urine: 1.017 (ref 1.001–1.03)
Squamous Epithelial / HPF: NONE SEEN /HPF (ref <=5)
WBC, UA: >=60 /HPF — AB (ref 0–5)
pH: 6 (ref 5.0–8.0)

## 2017-12-16 LAB — URINE CULTURE
MICRO NUMBER:: 91291449
SPECIMEN QUALITY:: ADEQUATE

## 2017-12-16 LAB — CULTURE INDICATED

## 2017-12-27 ENCOUNTER — Encounter: Payer: Self-pay | Admitting: Sports Medicine

## 2017-12-27 DIAGNOSIS — N401 Enlarged prostate with lower urinary tract symptoms: Secondary | ICD-10-CM

## 2017-12-27 DIAGNOSIS — R350 Frequency of micturition: Principal | ICD-10-CM

## 2017-12-27 MED ORDER — LEVOFLOXACIN 750 MG PO TABS: 750.0000 mg | ORAL_TABLET | Freq: Every day | ORAL | 0 refills | Status: DC

## 2017-12-28 ENCOUNTER — Other Ambulatory Visit: Payer: Self-pay | Admitting: Sports Medicine

## 2017-12-28 DIAGNOSIS — N401 Enlarged prostate with lower urinary tract symptoms: Secondary | ICD-10-CM

## 2017-12-28 DIAGNOSIS — N400 Enlarged prostate without lower urinary tract symptoms: Secondary | ICD-10-CM

## 2017-12-28 DIAGNOSIS — R350 Frequency of micturition: Principal | ICD-10-CM

## 2017-12-30 MED ORDER — NITROFURANTOIN MONOHYD MACRO 100 MG PO CAPS: 100.0000 mg | ORAL_CAPSULE | Freq: Two times a day (BID) | ORAL | 0 refills | Status: DC

## 2017-12-30 NOTE — Addendum Note (Signed)
Addended by: Silverio Decamp on: 12/30/2017 08:51 AM   Modules accepted: Orders

## 2018-01-25 ENCOUNTER — Other Ambulatory Visit: Payer: Self-pay | Admitting: Sports Medicine

## 2018-01-25 DIAGNOSIS — N400 Enlarged prostate without lower urinary tract symptoms: Secondary | ICD-10-CM

## 2018-02-01 ENCOUNTER — Encounter: Payer: Self-pay | Admitting: Sports Medicine

## 2018-02-01 ENCOUNTER — Other Ambulatory Visit: Payer: Self-pay | Admitting: Sports Medicine

## 2018-02-01 DIAGNOSIS — N400 Enlarged prostate without lower urinary tract symptoms: Secondary | ICD-10-CM

## 2018-02-03 MED ORDER — WARFARIN SODIUM 5 MG PO TABS: 5.0000 mg | ORAL_TABLET | ORAL | 11 refills | Status: DC

## 2018-02-03 MED ORDER — TAMSULOSIN HCL 0.4 MG PO CAPS: 0.8000 mg | ORAL_CAPSULE | Freq: Every day | ORAL | 3 refills | Status: DC

## 2018-02-03 MED ORDER — WARFARIN SODIUM 1 MG PO TABS: ORAL_TABLET | ORAL | 11 refills | Status: DC

## 2018-02-07 ENCOUNTER — Ambulatory Visit (INDEPENDENT_AMBULATORY_CARE_PROVIDER_SITE_OTHER): Payer: BLUE CROSS/BLUE SHIELD | Admitting: Family Medicine

## 2018-02-07 DIAGNOSIS — Z952 Presence of prosthetic heart valve: Secondary | ICD-10-CM

## 2018-02-07 LAB — POCT INR: INR: 2.8 (ref 2.0–3.0)

## 2018-02-07 NOTE — Progress Notes (Signed)
Pt in today for INR . INR was 2.8. Advised pt to continue with current dose. Pt  Will follow up in 2 weeks

## 2018-03-10 ENCOUNTER — Encounter: Payer: Self-pay | Admitting: Sports Medicine

## 2018-03-10 MED ORDER — WARFARIN SODIUM 1 MG PO TABS: ORAL_TABLET | ORAL | 11 refills | Status: DC

## 2018-03-10 MED ORDER — WARFARIN SODIUM 5 MG PO TABS: 5.0000 mg | ORAL_TABLET | ORAL | 11 refills | Status: DC

## 2018-03-22 IMAGING — DX DG CHEST 2V
2 series · 2 of 2 positions shown · non-contrast
Comparison: None.

CLINICAL DATA: Cough and congestion for 4 days

EXAM:
CHEST  2 VIEW

[chest pa]
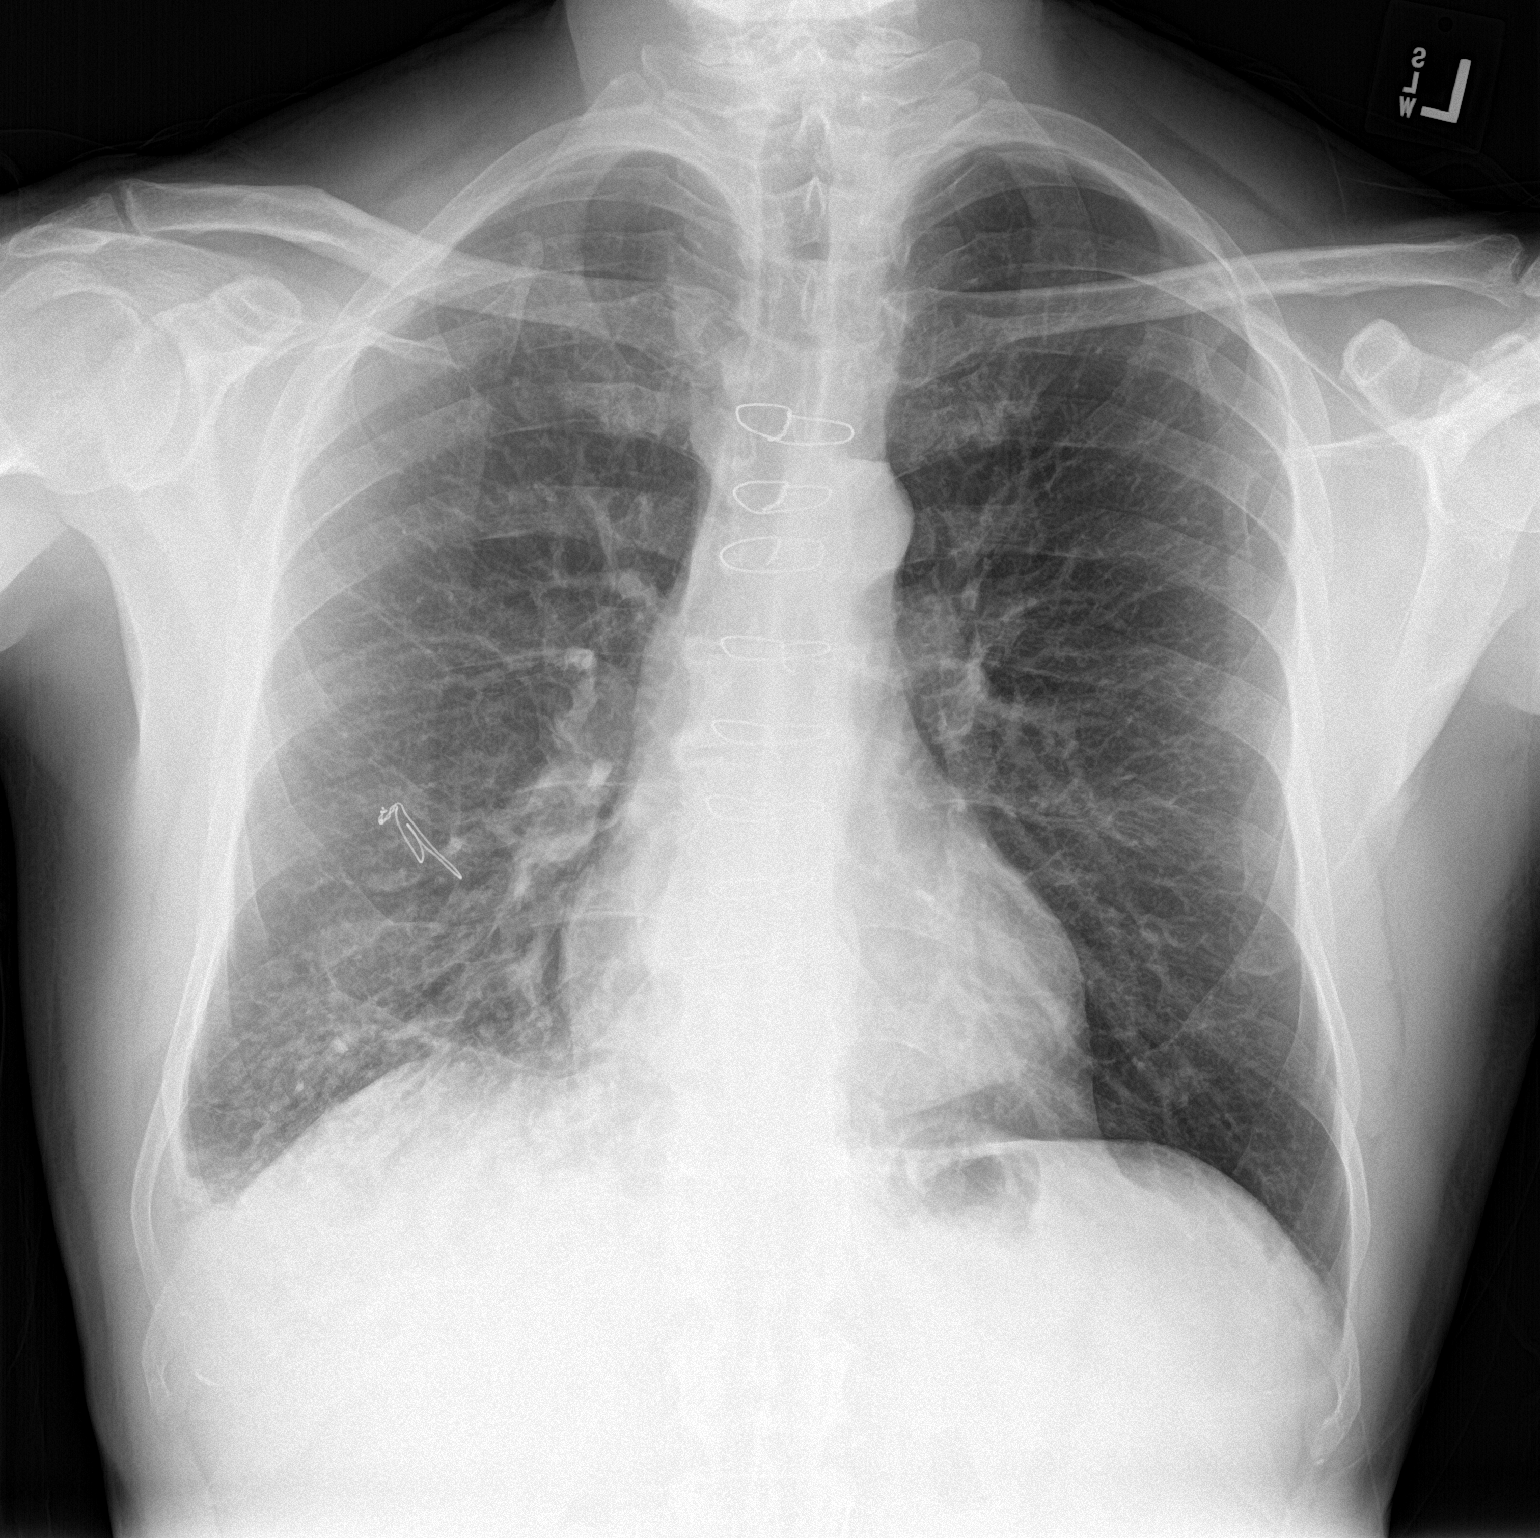

[chest lat]
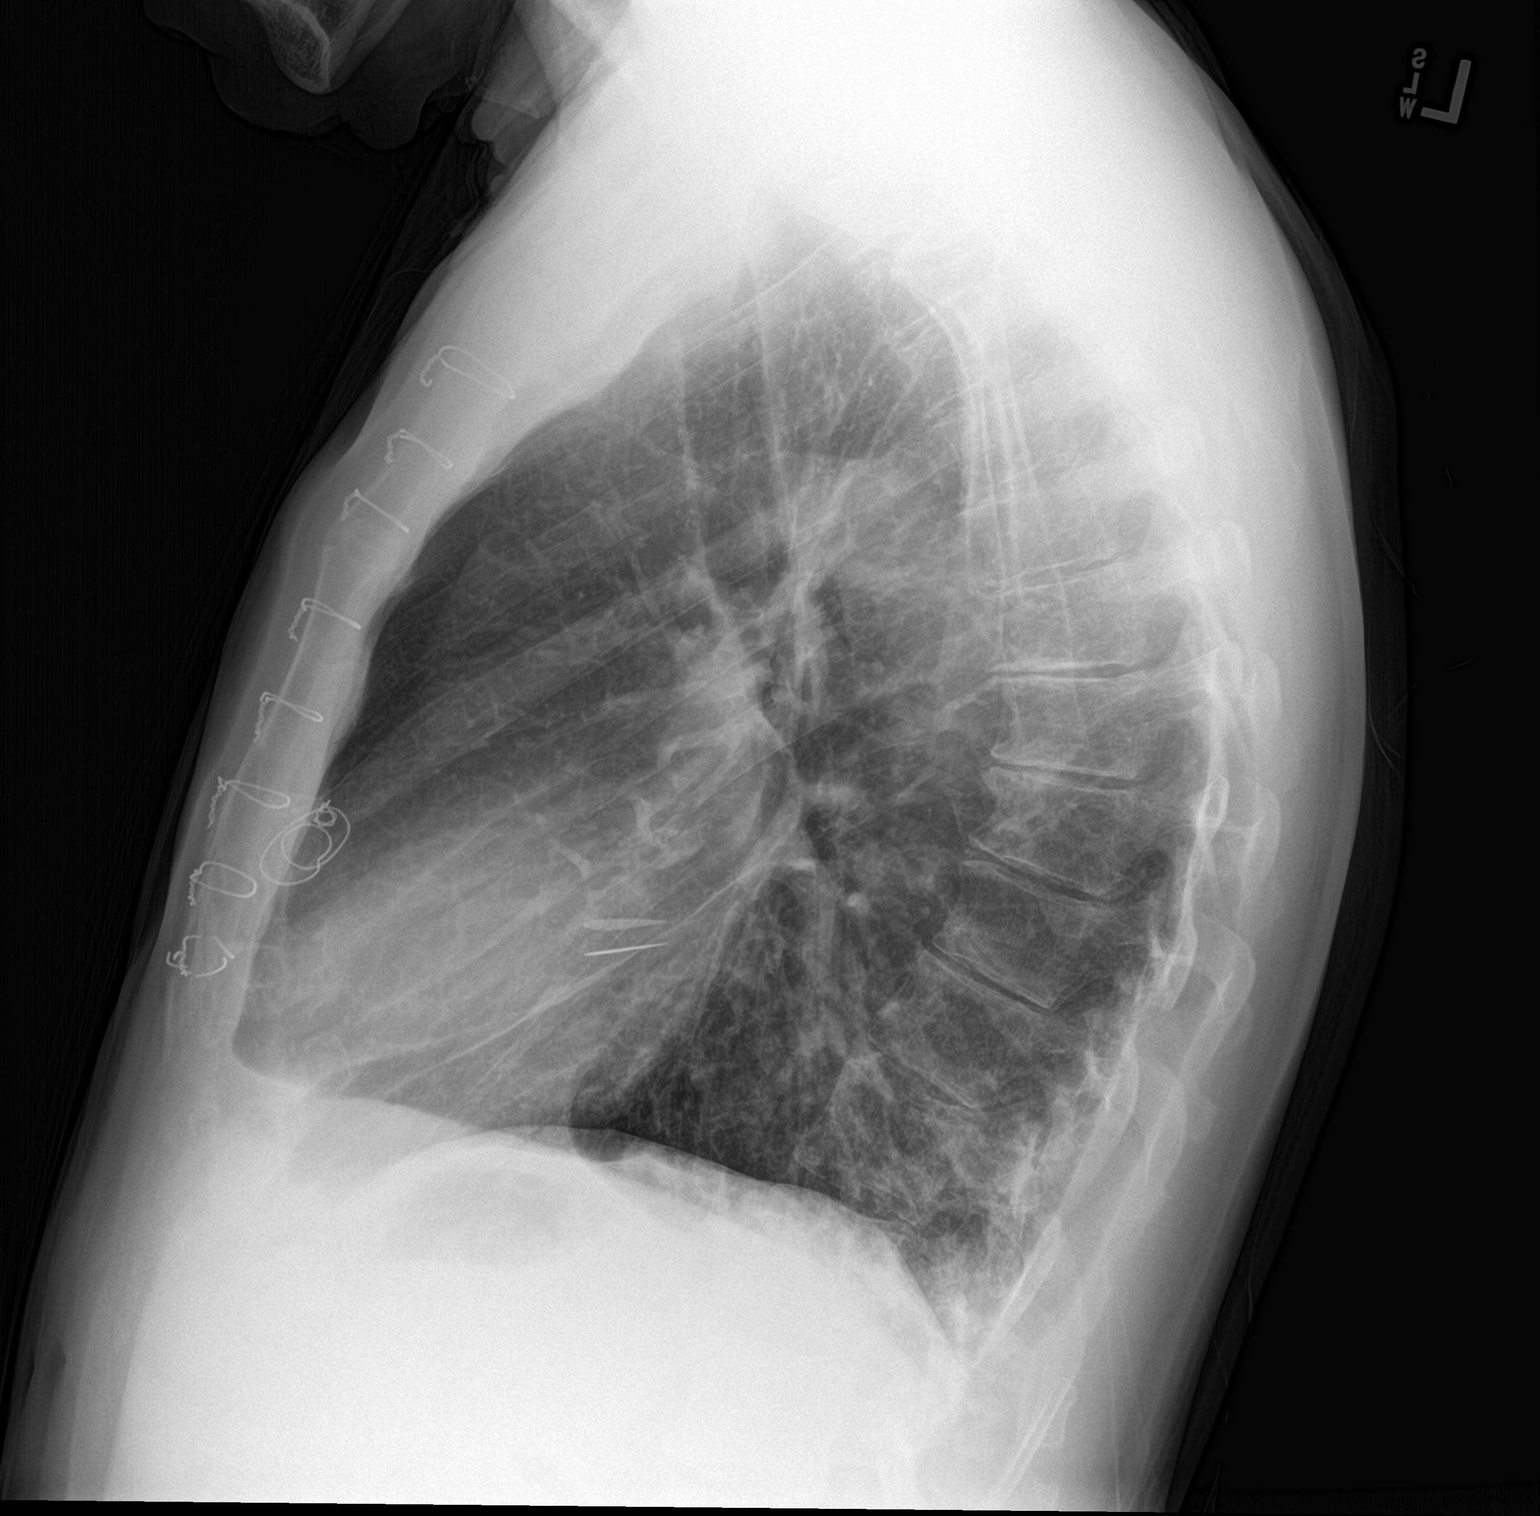

[2 of 2 positions shown; findings below may reference images not displayed]

FINDINGS: There is patchy predominantly interstitial opacity in the right
lower lobe. There is a small right pleural effusion. Lungs elsewhere
clear. Heart size and pulmonary vascularity are normal. No
adenopathy. Patient is status post mitral valve replacement. There
is degenerative change in the thoracic spine.
IMPRESSION: Interstitial infiltrate right base, consistent with pneumonia. Small
right pleural effusion. Lungs elsewhere clear. Status post mitral
valve replacement.

## 2018-04-07 ENCOUNTER — Encounter: Payer: Self-pay | Admitting: Sports Medicine

## 2018-05-07 ENCOUNTER — Encounter: Payer: Self-pay | Admitting: Sports Medicine

## 2018-05-14 ENCOUNTER — Encounter: Payer: Self-pay | Admitting: Sports Medicine

## 2018-10-28 ENCOUNTER — Encounter: Payer: Self-pay | Admitting: Sports Medicine

## 2019-01-24 ENCOUNTER — Other Ambulatory Visit: Payer: Self-pay | Admitting: Sports Medicine

## 2019-01-24 DIAGNOSIS — N400 Enlarged prostate without lower urinary tract symptoms: Secondary | ICD-10-CM

## 2019-01-24 MED ORDER — TAMSULOSIN HCL 0.4 MG PO CAPS: 0.8000 mg | ORAL_CAPSULE | Freq: Every day | ORAL | 3 refills | Status: DC

## 2019-03-11 ENCOUNTER — Other Ambulatory Visit: Payer: Self-pay | Admitting: Sports Medicine

## 2019-04-21 LAB — COLOGUARD: COLOGUARD: NEGATIVE

## 2019-04-21 LAB — EXTERNAL GENERIC LAB PROCEDURE: COLOGUARD: NEGATIVE

## 2019-05-09 LAB — COLOGUARD: Cologuard: NEGATIVE

## 2020-02-25 DIAGNOSIS — R339 Retention of urine, unspecified: Secondary | ICD-10-CM | POA: Diagnosis not present

## 2020-03-04 DIAGNOSIS — Z7901 Long term (current) use of anticoagulants: Secondary | ICD-10-CM | POA: Diagnosis not present

## 2020-03-04 DIAGNOSIS — Z952 Presence of prosthetic heart valve: Secondary | ICD-10-CM | POA: Diagnosis not present

## 2020-03-04 DIAGNOSIS — Z5181 Encounter for therapeutic drug level monitoring: Secondary | ICD-10-CM | POA: Diagnosis not present

## 2020-03-22 NOTE — Telephone Encounter (Signed)
Thank you for catching this Amber.  They are still putting surgical excisions in 15-minute slots huh?

## 2020-03-23 ENCOUNTER — Ambulatory Visit: Payer: PPO | Admitting: Sports Medicine

## 2020-03-25 ENCOUNTER — Ambulatory Visit (INDEPENDENT_AMBULATORY_CARE_PROVIDER_SITE_OTHER): Payer: PPO | Admitting: Sports Medicine

## 2020-03-25 ENCOUNTER — Other Ambulatory Visit: Payer: Self-pay

## 2020-03-25 DIAGNOSIS — L723 Sebaceous cyst: Secondary | ICD-10-CM | POA: Diagnosis not present

## 2020-03-25 MED ORDER — HYDROCODONE-ACETAMINOPHEN 10-325 MG PO TABS: 1.0000 | ORAL_TABLET | Freq: Three times a day (TID) | ORAL | 0 refills | Status: DC | PRN

## 2020-03-25 NOTE — Assessment & Plan Note (Signed)
Status post excision x2, with recurrence. Today we did a full excision with removal of the capsule with primary closure. Hydrocodone for postoperative pain. Return to see me in about 14 days for suture removal

## 2020-03-25 NOTE — Progress Notes (Addendum)
    Procedures performed today:    Procedure:  Excision of 2 cm upper back sebaceous cyst/subcutaneous tumor Risks, benefits, and alternatives explained and consent obtained. Time out conducted. Surface prepped with alcohol. 10cc lidocaine with epinephine infiltrated in a field block. Adequate anesthesia ensured. Area prepped and draped in a sterile fashion. Excision performed with: Using a #10 blade I made a horizontal incision over the cyst, then using both sharp and blunt dissection I was able to remove the cyst with its capsule, I then closed the incision with #3 3-0 Ethilon vertical mattress sutures, this reinforced the wound and I then placed approximately 7 simple interrupted 3-0 Ethilon sutures to close the skin completely. Hemostasis achieved. Pt stable.  Independent interpretation of notes and tests performed by another provider:   None.  Brief History, Exam, Impression, and Recommendations:    Sebaceous cyst Status post excision x2, with recurrence. Today we did a full excision with removal of the capsule with primary closure. Hydrocodone for postoperative pain. Return to see me in about 14 days for suture removal    ___________________________________________ Gwen Her. Dianah Field, M.D., ABFM., CAQSM. Primary Care and [place] Instructor of [place] of [facility] of Medicine

## 2020-03-25 NOTE — Patient Instructions (Signed)
Incision Care, Adult An incision is a surgical cut that is made through your skin. Most incisions are closed after a surgical procedure. Your incision may be closed with stitches (sutures), staples, skin glue, or adhesive strips. You may need to return to your health care provider to have sutures or staples removed. This may occur several days or several weeks after your surgery. Until then, the incision needs to be cared for properly to prevent infection. Follow instructions from your health care provider about how to care for your incision. Supplies needed:  Soap, water, and a clean hand towel.  Wound cleanser.  A clean bandage (dressing), if needed.  Cream or ointment, if told by your health care provider.  Clean gauze. How to care for your incision Cleaning the incision Ask your health care provider how to clean the incision. This may include:  Using mild soap and water, or wound cleanser.  Using a clean gauze to pat the incision dry after cleaning it. Dressing changes  Wash your hands with soap and water for at least 20 seconds before and after you change the dressing. If soap and water are not available, use hand sanitizer.  Change your dressing as told by your health care provider.  Leave sutures, staples, skin glue, or adhesive strips in place. These skin closures may need to stay in place for 2 weeks or longer. If adhesive strip edges start to loosen and curl up, you may trim the loose edges. Do not remove adhesive strips completely unless your health care provider tells you to do that.  Apply cream or ointment. Do this only as told by your health care provider.  Cover the incision with a clean dressing. Ask your health care provider when you can begin leaving the incision uncovered. Checking for infection Check your incision area every day for signs of infection. Check for:  More redness, swelling, or pain.  More fluid or blood.  Warmth.  Pus or a bad smell.    Follow these instructions at home Medicines  Take over-the-counter and prescription medicines only as told by your health care provider.  If you were prescribed an antibiotic medicine, cream, or ointment, take or apply it as told by your health care provider. Do not stop using the antibiotic even if your condition improves. Eating and drinking  Eat a diet that includes protein, vitamin A, vitamin C, and other nutrient-rich foods to help the wound heal. ? Foods rich in protein include meat, fish, eggs, dairy, beans, and nuts. ? Foods rich in vitamin A include carrots and dark green, leafy vegetables. ? Foods rich in vitamin C include citrus fruits, tomatoes, broccoli, and peppers.  Drink enough fluid to keep your urine pale yellow. General instructions  Do not take baths, swim, use a hot tub, or do anything that would put the incision underwater until your health care provider approves. Ask your health care provider if you may take showers. You may only be allowed to take sponge baths.  Limit movement around your incision to promote healing. ? Avoid straining, lifting, or exercising for the first 2 weeks after your procedure, or for as long as told by your health care provider. ? Return to your normal activities as told by your health care provider. Ask your health care provider what activities are safe for you.  Do not scratch or pick at the incision. Keep it covered as told by your health care provider.  Protect your incision from the sun when you are   outside for the first 6 months, or for as long as told by your health care provider. Cover up the scar area or apply sunscreen that has an SPF of at least 30.  Do not use any products that contain nicotine or tobacco, such as cigarettes, e-cigarettes, and chewing tobacco. These can delay incision healing after surgery. If you need help quitting, ask your health care provider.  Keep all follow-up visits as told by your health care  provider. This is important.   Contact a health care provider if:  You have any of these signs of infection: ? More redness, swelling, or pain around your incision. ? More fluid or blood coming from your incision. ? Warmth coming from your incision. ? Pus or a bad smell coming from your incision. ? A fever.  You are nauseous or you vomit.  You are dizzy.  Your sutures, staples, skin glue, or adhesive strips come undone. Get help right away if:  You have a red streak on the skin near your incision.  Your incision bleeds through the dressing and the bleeding does not stop with gentle pressure.  The edges of your incision open up and separate.  You have signs of a serious bodily reaction to an infection. These signs may include: ? Fever, shaking chills, or feeling very cold. ? Confusion or anxiety. ? Severe pain. ? Trouble breathing. ? Fast heartbeat. ? Clammy or sweaty skin. ? A rash. These symptoms may represent a serious problem that is an emergency. Do not wait to see if the symptoms will go away. Get medical help right away. Call your local emergency services (911 in the [place] Do not drive yourself to the hospital. Summary  Follow instructions from your health care provider about how to care for your incision.  Wash your hands with soap and water for at least 20 seconds before and after you change the dressing. If soap and water are not available, use hand sanitizer.  Check your incision area every day for signs of infection.  Keep all follow-up visits as told by your health care provider. This is important. This information is not intended to replace advice given to you by your health care provider. Make sure you discuss any questions you have with your health care provider. Document Revised: 11/26/2018 Document Reviewed: 11/26/2018 Elsevier Patient Education  2021 Elsevier Inc.   

## 2020-03-27 ENCOUNTER — Telehealth: Payer: PPO | Admitting: Family

## 2020-03-27 DIAGNOSIS — L723 Sebaceous cyst: Secondary | ICD-10-CM

## 2020-03-27 MED ORDER — SULFAMETHOXAZOLE-TRIMETHOPRIM 800-160 MG PO TABS: 1.0000 | ORAL_TABLET | Freq: Two times a day (BID) | ORAL | 0 refills | Status: DC

## 2020-03-27 NOTE — Progress Notes (Signed)
E Visit for Cellulitis  We are sorry that you are not feeling well. Here is how we plan to help!  Based on what you shared with me it looks like you have cellulitis.  Cellulitis looks like areas of skin redness, swelling, and warmth; it develops as a result of bacteria entering under the skin. Little red spots and/or bleeding can be seen in skin, and tiny surface sacs containing fluid can occur. Fever can be present. Cellulitis is almost always on one side of a body, and the lower limbs are the most common site of involvement.   I have prescribed:  Bactrim DS 1 tablet by mouth twice a day for 7 days.  You need to put a tight pressure dressing and leave for 24 hours. Please call your PCP office and make a follow up as soon as possible.   HOME CARE:  . Take your medications as ordered and take all of them, even if the skin irritation appears to be healing.   GET HELP RIGHT AWAY IF:  . Symptoms that don't begin to go away within 48 hours. . Severe redness persists or worsens . If the area turns color, spreads or swells. . If it blisters and opens, develops yellow-brown crust or bleeds. . You develop a fever or chills. . If the pain increases or becomes unbearable.  . Are unable to keep fluids and food down.  MAKE SURE YOU    Understand these instructions.  Will watch your condition.  Will get help right away if you are not doing well or get worse.  Thank you for choosing an e-visit. Your e-visit answers were reviewed by a board certified advanced clinical practitioner to complete your personal care plan. Depending upon the condition, your plan could have included both over the counter or prescription medications. Please review your pharmacy choice. Make sure the pharmacy is open so you can pick up prescription now. If there is a problem, you may contact your provider through [employer] and have the prescription routed to another pharmacy. Your safety is important to [place]. If you  have drug allergies check your prescription carefully.  For the next 24 hours you can use MyChart to ask questions about today's visit, request a non-urgent call back, or ask for a work or school excuse. You will get an email in the next two days asking about your experience. I hope that your e-visit has been valuable and will speed your recovery.  Approximately 5 minutes was spent documenting and reviewing patient's chart.

## 2020-03-29 ENCOUNTER — Other Ambulatory Visit: Payer: Self-pay

## 2020-03-29 ENCOUNTER — Encounter: Payer: Self-pay | Admitting: Sports Medicine

## 2020-03-29 ENCOUNTER — Ambulatory Visit (INDEPENDENT_AMBULATORY_CARE_PROVIDER_SITE_OTHER): Payer: PPO | Admitting: Sports Medicine

## 2020-03-29 DIAGNOSIS — L723 Sebaceous cyst: Secondary | ICD-10-CM

## 2020-03-29 NOTE — Assessment & Plan Note (Addendum)
This is a pleasant 66 year old male, 4 days ago I did a full surgical excision of a sebaceous cyst with removal of the capsule and primary closure with vertical mattress reinforcing sutures and simple interrupted to close the incision. 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 He did have an E-visit and was prescribed Septra, he can finish this. On exam today he has some hyperemia of the incision site, but no surrounding induration to suggest infection. I removed the horizontal mattress reinforcing sutures, debrided devitalized tissue and redressed the wound. I would still like him to come back in approximately 10 days for complete suture removal. 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 Belenda Cruise, can change his dressing.

## 2020-03-29 NOTE — Progress Notes (Signed)
    Procedures performed today:    None.  Independent interpretation of notes and tests performed by another provider:   None.  Brief History, Exam, Impression, and Recommendations:    Sebaceous cyst This is a pleasant 65 year old male, 4 days ago I did a full surgical excision of a sebaceous cyst with removal of the capsule and primary closure with vertical mattress reinforcing sutures and simple interrupted to close the incision. 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 He did have an E-visit and was prescribed Septra, he can finish this. On exam today he has some hyperemia of the incision site, but no surrounding induration to suggest infection. I removed the horizontal mattress reinforcing sutures, debrided devitalized tissue and redressed the wound. I would still like him to come back in approximately 10 days for complete suture removal.     ___________________________________________ Gwen Her. Dianah Field, M.D., ABFM., CAQSM. Primary Care and [place] Instructor of [place] of [facility] of Medicine

## 2020-03-29 NOTE — Telephone Encounter (Signed)
Patient was scheduled

## 2020-03-29 NOTE — Telephone Encounter (Signed)
I think he should already be scheduled for today, okay to double book, I am always okay to double book if there is a complication or potential complication from a surgical procedure that I have performed myself.  It is probably just inflamed.

## 2020-04-08 ENCOUNTER — Ambulatory Visit (INDEPENDENT_AMBULATORY_CARE_PROVIDER_SITE_OTHER): Payer: PPO | Admitting: Sports Medicine

## 2020-04-08 ENCOUNTER — Other Ambulatory Visit: Payer: Self-pay

## 2020-04-08 DIAGNOSIS — L723 Sebaceous cyst: Secondary | ICD-10-CM

## 2020-04-08 NOTE — Assessment & Plan Note (Signed)
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 The incision looks great, there is a tiny area of dehiscence that we closed with Dermabond, return as needed.

## 2020-04-08 NOTE — Progress Notes (Signed)
    Procedures performed today:    None.  Independent interpretation of notes and tests performed by another provider:   None.  Brief History, Exam, Impression, and Recommendations:    Sebaceous cyst 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 The incision looks great, there is a tiny area of dehiscence that we closed with Dermabond, return as needed.    ___________________________________________ Gwen Her. Dianah Field, M.D., ABFM., CAQSM. Primary Care and [place] Instructor of [place] of [facility] of Medicine

## 2020-04-15 DIAGNOSIS — Z7901 Long term (current) use of anticoagulants: Secondary | ICD-10-CM | POA: Diagnosis not present

## 2020-04-15 DIAGNOSIS — Z952 Presence of prosthetic heart valve: Secondary | ICD-10-CM | POA: Diagnosis not present

## 2020-04-15 DIAGNOSIS — Z5181 Encounter for therapeutic drug level monitoring: Secondary | ICD-10-CM | POA: Diagnosis not present

## 2020-05-06 DIAGNOSIS — Z952 Presence of prosthetic heart valve: Secondary | ICD-10-CM | POA: Diagnosis not present

## 2020-05-06 DIAGNOSIS — Z7901 Long term (current) use of anticoagulants: Secondary | ICD-10-CM | POA: Diagnosis not present

## 2020-05-06 DIAGNOSIS — Z5181 Encounter for therapeutic drug level monitoring: Secondary | ICD-10-CM | POA: Diagnosis not present

## 2020-05-09 DIAGNOSIS — N411 Chronic prostatitis: Secondary | ICD-10-CM | POA: Diagnosis not present

## 2020-07-05 ENCOUNTER — Ambulatory Visit (INDEPENDENT_AMBULATORY_CARE_PROVIDER_SITE_OTHER): Payer: PPO

## 2020-07-05 ENCOUNTER — Other Ambulatory Visit: Payer: Self-pay

## 2020-07-05 ENCOUNTER — Ambulatory Visit (INDEPENDENT_AMBULATORY_CARE_PROVIDER_SITE_OTHER): Payer: PPO | Admitting: Sports Medicine

## 2020-07-05 ENCOUNTER — Ambulatory Visit: Payer: PPO | Admitting: Sports Medicine

## 2020-07-05 DIAGNOSIS — Z09 Encounter for follow-up examination after completed treatment for conditions other than malignant neoplasm: Secondary | ICD-10-CM | POA: Diagnosis not present

## 2020-07-05 DIAGNOSIS — N401 Enlarged prostate with lower urinary tract symptoms: Secondary | ICD-10-CM

## 2020-07-05 DIAGNOSIS — M25561 Pain in right knee: Secondary | ICD-10-CM

## 2020-07-05 DIAGNOSIS — E7800 Pure hypercholesterolemia, unspecified: Secondary | ICD-10-CM | POA: Diagnosis not present

## 2020-07-05 DIAGNOSIS — L989 Disorder of the skin and subcutaneous tissue, unspecified: Secondary | ICD-10-CM

## 2020-07-05 DIAGNOSIS — M25461 Effusion, right knee: Secondary | ICD-10-CM

## 2020-07-05 DIAGNOSIS — R350 Frequency of micturition: Secondary | ICD-10-CM | POA: Diagnosis not present

## 2020-07-05 DIAGNOSIS — M7989 Other specified soft tissue disorders: Secondary | ICD-10-CM | POA: Diagnosis not present

## 2020-07-05 DIAGNOSIS — Z96651 Presence of right artificial knee joint: Secondary | ICD-10-CM

## 2020-07-05 DIAGNOSIS — M1712 Unilateral primary osteoarthritis, left knee: Secondary | ICD-10-CM | POA: Diagnosis not present

## 2020-07-05 NOTE — Assessment & Plan Note (Signed)
Left fibular head dark lesion. I think it is a scab, no further intervention needed.

## 2020-07-05 NOTE — Progress Notes (Signed)
Procedures performed today:    None.  Independent interpretation of notes and tests performed by another provider:   None.  Brief History, Exam, Impression, and Recommendations:    History of arthroplasty of right knee Dr. Mullan returns, he continues to have problems with his knee after a pain-free period of time. He recalls doing some exercise followed by increasing swelling, loss of motion, achiness in the knee. No fevers, chills. We will get an ESR, x-rays. If ESR is abnormal we will proceed with irrigation/aspiration for Synovasure testing. If x-rays are abnormal we will have him follow-up with orthopedic surgery, if x-rays are normal we will proceed with three-phase bone scan. Of note he does have an appointment with Dr. Xu.  Skin lesion Left fibular head dark lesion. I think it is a scab, no further intervention needed.    ___________________________________________  J. , M.D., ABFM., CAQSM. Primary Care and Sports Medicine [facility]  Adjunct Instructor of Family Medicine  [employer] 

## 2020-07-05 NOTE — Assessment & Plan Note (Addendum)
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 He recalls doing some exercise followed by increasing swelling, loss of motion, achiness in the knee. No fevers, chills. We will get an ESR, x-rays. If ESR is abnormal we will proceed with irrigation/aspiration for Synovasure testing. If x-rays are abnormal we will have him follow-up with orthopedic surgery, if x-rays are normal we will proceed with three-phase bone scan. Of note he does have an appointment with Dr. Xu. 

## 2020-07-06 LAB — CBC WITH DIFFERENTIAL/PLATELET
Absolute Monocytes: 911 cells/uL (ref 200–950)
Basophils Absolute: 28 cells/uL (ref 0–200)
Basophils Relative: 0.3 %
Eosinophils Absolute: 37 cells/uL (ref 15–500)
Eosinophils Relative: 0.4 %
HCT: 47 % (ref 38.5–50.0)
Hemoglobin: 15.9 g/dL (ref 13.2–17.1)
Lymphs Abs: 1107 cells/uL (ref 850–3900)
MCH: 30.6 pg (ref 27.0–33.0)
MCHC: 33.8 g/dL (ref 32.0–36.0)
MCV: 90.4 fL (ref 80.0–100.0)
MPV: 11.4 fL (ref 7.5–12.5)
Monocytes Relative: 9.8 %
Neutro Abs: 7217 cells/uL (ref 1500–7800)
Neutrophils Relative %: 77.6 %
Platelets: 133 10*3/uL — ABNORMAL LOW (ref 140–400)
RBC: 5.2 10*6/uL (ref 4.20–5.80)
RDW: 13.5 % (ref 11.0–15.0)
Total Lymphocyte: 11.9 %
WBC: 9.3 10*3/uL (ref 3.8–10.8)

## 2020-07-06 LAB — COMPREHENSIVE METABOLIC PANEL
AG Ratio: 2.5 (calc) (ref 1.0–2.5)
ALT: 21 U/L (ref 9–46)
AST: 23 U/L (ref 10–35)
Albumin: 4.3 g/dL (ref 3.6–5.1)
Alkaline phosphatase (APISO): 48 U/L (ref 35–144)
BUN: 22 mg/dL (ref 7–25)
CO2: 34 mmol/L — ABNORMAL HIGH (ref 20–32)
Calcium: 9.3 mg/dL (ref 8.6–10.3)
Chloride: 103 mmol/L (ref 98–110)
Creat: 1.06 mg/dL (ref 0.70–1.25)
Globulin: 1.7 g/dL (calc) — ABNORMAL LOW (ref 1.9–3.7)
Glucose, Bld: 65 mg/dL (ref 65–139)
Potassium: 3.9 mmol/L (ref 3.5–5.3)
Sodium: 142 mmol/L (ref 135–146)
Total Bilirubin: 1.5 mg/dL — ABNORMAL HIGH (ref 0.2–1.2)
Total Protein: 6 g/dL — ABNORMAL LOW (ref 6.1–8.1)

## 2020-07-06 LAB — SEDIMENTATION RATE: Sed Rate: 2 mm/h (ref 0–20)

## 2020-07-06 LAB — PSA, TOTAL AND FREE
PSA, % Free: 17 % (calc) — ABNORMAL LOW (ref >25)
PSA, Free: 0.2 ng/mL
PSA, Total: 1.2 ng/mL (ref <=4.0)

## 2020-07-06 LAB — TSH: TSH: 1.34 mIU/L (ref 0.40–4.50)

## 2020-07-07 DIAGNOSIS — E7800 Pure hypercholesterolemia, unspecified: Secondary | ICD-10-CM | POA: Diagnosis not present

## 2020-07-07 DIAGNOSIS — N401 Enlarged prostate with lower urinary tract symptoms: Secondary | ICD-10-CM | POA: Diagnosis not present

## 2020-07-07 DIAGNOSIS — R350 Frequency of micturition: Secondary | ICD-10-CM | POA: Diagnosis not present

## 2020-07-07 DIAGNOSIS — Z96651 Presence of right artificial knee joint: Secondary | ICD-10-CM | POA: Diagnosis not present

## 2020-07-08 LAB — LIPID PANEL
Cholesterol: 179 mg/dL (ref <200)
HDL: 49 mg/dL (ref >=40)
LDL Cholesterol (Calc): 107 mg/dL (calc) — ABNORMAL HIGH
Non-HDL Cholesterol (Calc): 130 mg/dL (calc) — ABNORMAL HIGH (ref <130)
Total CHOL/HDL Ratio: 3.7 (calc) (ref <5.0)
Triglycerides: 118 mg/dL (ref <150)

## 2020-07-12 ENCOUNTER — Ambulatory Visit: Payer: PPO | Admitting: Orthopaedic Surgery

## 2020-07-12 DIAGNOSIS — Z96651 Presence of right artificial knee joint: Secondary | ICD-10-CM | POA: Insufficient documentation

## 2020-07-12 NOTE — Progress Notes (Signed)
Office Visit Note   Patient: Zachary Calderon           Date of Birth: 1955-11-19           MRN: [id number] Visit Date: 07/12/2020              Requested by: Silverio Decamp, [address] [place],  [place] [address] PCP: Silverio Decamp, MD   Assessment & Plan: Visit Diagnoses:  1. Status post total right knee replacement     Plan: My impression of what is going on is that his polyethylene liner may be too thin and causing laxity and therefore inflammation within the knee joint and synovitis and effusion.  I do not get the sense that it is infected.  The effusion does seem to be quite limiting therefore we aspirated this today.  We obtained 40 cc of bloody effusion.  I suspect that this is a result of him being on Coumadin and overactivity and laxity in the knee replacement.  We will also make sure that he gets a triple phase bone scan to rule out infection and loosening.  He had an ESR level obtained previously which was normal.  In the meantime we will provide him with a hinged knee brace to see if this will help during activity.  I will follow-up with him regarding the lab work and bone scan.  Follow-Up Instructions: No follow-ups on file.   Orders:  No orders of the defined types were placed in this encounter.  No orders of the defined types were placed in this encounter.     Procedures: No procedures performed   Clinical Data: No additional findings.   Subjective: Chief Complaint  Patient presents with  . Right Knee - Pain    Zachary Calderon is a very pleasant 65 year old gentleman who is a Restaurant manager, fast food at [employer] who comes in for evaluation of chronic right knee pain with recent episode in which the knee became severely swollen and stiff.  He is about 5 years status post right total knee replacement by Dr.Lucio.  He did not have any postoperative complications other than hemorrhagic effusions due to combination of aggressive PT and  being on chronic Coumadin for history of valve replacement.  He denies any constitutional symptoms.  He had an episode in which the right knee was drained by Dr. Reynaldo Minium after surgery and per report was synovial fluid.  He notices that symptoms are worse mainly after increase activity.  He still is very active and goes to the gym.  He had an episode last month after he was on the bike in which his knee swelled up quite a bit.   Review of Systems  Constitutional: Negative.   All other systems reviewed and are negative.    Objective: Vital Signs: There were no vitals taken for this visit.  Physical Exam Vitals and nursing note reviewed.  Constitutional:      Appearance: He is well-developed.  HENT:     Head: Normocephalic and atraumatic.  Eyes:     Pupils: Pupils are equal, round, and reactive to light.  Pulmonary:     Effort: Pulmonary effort is normal.  Abdominal:     Palpations: Abdomen is soft.  Musculoskeletal:        General: Normal range of motion.     Cervical back: Neck supple.  Skin:    General: Skin is warm.  Neurological:     Mental Status: He is alert and  oriented to person, place, and time.  Psychiatric:        Behavior: Behavior normal.        Thought Content: Thought content normal.        Judgment: Judgment normal.     Ortho Exam Right knee shows a fully healed surgical scar.  Small to moderate joint effusion.  Range of motion is 0-90 with stiffness and discomfort.  No signs of infection.  Varus valgus stress does produce moderate laxity without pain.  Specialty Comments:  No specialty comments available.  Imaging: No results found.   PMFS History: Patient Active Problem List   Diagnosis Date Noted  . 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  . Skin lesion 07/05/2020  . Sebaceous cyst 03/25/2020  . Skin tag 06/20/2017  . Medial epicondylitis, left 06/20/2017  . Post-viral cough syndrome 06/14/2017  . Inguinal hernia, right 07/20/2016   . 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  . HAP (hospital-acquired pneumonia) 01/30/2016  . External hemorrhoid 01/20/2016  . History of arthroplasty of right knee 11/07/2015  . Migraine with aura 07/22/2015  . Primary osteoarthritis of right knee 06/24/2015  . BPH (benign prostatic hyperplasia) 06/24/2015  . Annual physical exam 06/24/2015  . S/P MVR (mitral valve replacement)   . Endocarditis   . Hyperlipidemia   . Chronic anticoagulation    Past Medical History:  Diagnosis Date  . Arthritis    R knee- , not symptomatic but reports DJD  . Baker's cyst    right leg   . Bladder infection, chronic    post op - knee replacement, has  had 3 rounds of antibiotics   . BPH (benign prostatic hypertrophy)   . Cancer ([facility])    basal cell carcinoma - abd  . Chronic anticoagulation   . Endocarditis   . Family history of hiatal hernia   . Headache    migraines  . Heart murmur   . Hemorrhoids   . History of kidney stones 2018   2 stones- /w CAT scan, not yet passed   . Hyperlipidemia   . MVP (mitral valve prolapse)   . Pneumonia 2017   post op after - R TKR  . PONV (postoperative nausea and vomiting)    with dizziness   . Proctitis   . S/P MVR (mitral valve replacement)   . Tinnitus   . Urinary frequency   . Weak urinary stream     Family History  Problem Relation Age of Onset  . Hypertension Mother   . Leukemia Mother     Past Surgical History:  Procedure Laterality Date  . APPENDECTOMY  1970  . INGUINAL HERNIA REPAIR Right 10/26/2016   Procedure: RIGHT INGUINAL HERNIA REPAIR WITH MESH;  Surgeon: Jackolyn Confer, MD;  Location: [facility];  Service: General;  Laterality: Right;  GENERAL AND LMA   . INSERTION OF MESH Right 10/26/2016   Procedure: INSERTION OF MESH;  Surgeon: Jackolyn Confer, MD;  Location: [facility];  Service: General;  Laterality: Right;  . MITRAL VALVE REPAIR    . MITRAL VALVE REPLACEMENT    . right knee arthroscopy     times 2  . TONSILLECTOMY    . TOTAL KNEE  ARTHROPLASTY Right 11/07/2015   Procedure: RIGHT TOTAL KNEE ARTHROPLASTY;  Surgeon: Gaynelle Arabian, MD;  Location: [facility];  Service: Orthopedics;  Laterality: Right;   Social History   Occupational History  . Not on file  Tobacco Use  . Smoking status: Never Smoker  . Smokeless tobacco: Never  Used  Vaping Use  . Vaping Use: Never used  Substance and Sexual Activity  . Alcohol use: Yes    Comment: 1 drink 5 times per week   . Drug use: No  . Sexual activity: Yes

## 2020-07-13 ENCOUNTER — Encounter: Payer: Self-pay | Admitting: Orthopaedic Surgery

## 2020-07-13 LAB — SYNOVIAL FLUID ANALYSIS, COMPLETE
Basophils, %: 0 %
Eosinophils-Synovial: 0 % (ref 0–2)
Lymphocytes-Synovial Fld: 5 % (ref 0–74)
Monocyte/Macrophage: 17 % (ref 0–69)
Neutrophil, Synovial: 78 % — ABNORMAL HIGH (ref 0–24)
Synoviocytes, %: 0 % (ref 0–15)
WBC, Synovial: 3960 cells/uL — ABNORMAL HIGH (ref <150)

## 2020-07-14 NOTE — Telephone Encounter (Signed)
Hi Zachary Calderon, Dr. [name] is asking me if we think a culture would be added on considering the high neutrophil count.  FYI.  Hope you have been well!

## 2020-07-15 DIAGNOSIS — Z952 Presence of prosthetic heart valve: Secondary | ICD-10-CM | POA: Diagnosis not present

## 2020-07-15 DIAGNOSIS — Z7901 Long term (current) use of anticoagulants: Secondary | ICD-10-CM | POA: Diagnosis not present

## 2020-07-19 ENCOUNTER — Encounter: Payer: Self-pay | Admitting: Orthopaedic Surgery

## 2020-07-21 ENCOUNTER — Encounter (HOSPITAL_COMMUNITY)
Admission: RE | Admit: 2020-07-21 | Discharge: 2020-07-21 | Disposition: A | Discharge status: Home / Self Care | Payer: PPO | Source: Ambulatory Visit | Attending: Orthopaedic Surgery | Admitting: Orthopaedic Surgery

## 2020-07-21 ENCOUNTER — Other Ambulatory Visit: Payer: Self-pay

## 2020-07-21 DIAGNOSIS — M25561 Pain in right knee: Secondary | ICD-10-CM | POA: Diagnosis not present

## 2020-07-21 DIAGNOSIS — Z96651 Presence of right artificial knee joint: Secondary | ICD-10-CM | POA: Insufficient documentation

## 2020-07-21 DIAGNOSIS — M7989 Other specified soft tissue disorders: Secondary | ICD-10-CM | POA: Diagnosis not present

## 2020-07-21 MED ORDER — TECHNETIUM TC 99M MEDRONATE IV KIT
20.8000 | PACK | Freq: Once | INTRAVENOUS | Status: AC | PRN
  Administered 2020-07-21: 20.8 via INTRAVENOUS

## 2020-07-26 ENCOUNTER — Ambulatory Visit: Payer: PPO | Admitting: Orthopaedic Surgery

## 2020-07-26 ENCOUNTER — Encounter: Payer: Self-pay | Admitting: Orthopaedic Surgery

## 2020-07-26 DIAGNOSIS — Z96651 Presence of right artificial knee joint: Secondary | ICD-10-CM | POA: Diagnosis not present

## 2020-07-26 NOTE — Progress Notes (Signed)
Office Visit Note   Patient: Zachary Calderon           Date of Birth: 1956/01/16           MRN: [id number] Visit Date: 07/26/2020              Requested by: Silverio Decamp, [address] [place],  [place] [address] PCP: Silverio Decamp, MD   Assessment & Plan: Visit Diagnoses:  1. Status post total right knee replacement     Plan: Bone scan shows uptake around the prosthesis.  Given the fact that its been over 4 and half years from the knee replacement I would not expect bony remodeling to show up on a bone scan.  Also his knee aspiration showed close to 4000 white blood cells which is concerning for infection.  Given these findings I recommended referral to total joint specialist.  Based on options of Dr. Mayer Camel, [place], [place] at [facility] he would like to speak with Dr.Rowan first as well as have [place] make a referral to Dr. Richardo Priest at [facility].  From my standpoint we will see him back as needed.  Follow-Up Instructions: Return if symptoms worsen or fail to improve.   Orders:  Orders Placed This Encounter  Procedures  . Ambulatory referral to Orthopedic Surgery  . Ambulatory referral to Orthopedic Surgery   No orders of the defined types were placed in this encounter.     Procedures: No procedures performed   Clinical Data: No additional findings.   Subjective: Chief Complaint  Patient presents with  . Right Knee - Pain    Zachary Calderon returns today for bone scan review.  Denies any changes in medical history.   Review of Systems   Objective: Vital Signs: There were no vitals taken for this visit.  Physical Exam  Ortho Exam Right knee exam is stable. Specialty Comments:  No specialty comments available.  Imaging: No results found.   PMFS History: Patient Active Problem List   Diagnosis Date Noted  . 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  . Skin lesion 07/05/2020  . Sebaceous cyst 03/25/2020  . Skin  tag 06/20/2017  . Medial epicondylitis, left 06/20/2017  . Post-viral cough syndrome 06/14/2017  . Inguinal hernia, right 07/20/2016  . 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  . HAP (hospital-acquired pneumonia) 01/30/2016  . External hemorrhoid 01/20/2016  . History of arthroplasty of right knee 11/07/2015  . Migraine with aura 07/22/2015  . Primary osteoarthritis of right knee 06/24/2015  . BPH (benign prostatic hyperplasia) 06/24/2015  . Annual physical exam 06/24/2015  . S/P MVR (mitral valve replacement)   . Endocarditis   . Hyperlipidemia   . Chronic anticoagulation    Past Medical History:  Diagnosis Date  . Arthritis    R knee- , not symptomatic but reports DJD  . Baker's cyst    right leg   . Bladder infection, chronic    post op - knee replacement, has  had 3 rounds of antibiotics   . BPH (benign prostatic hypertrophy)   . Cancer ([facility])    basal cell carcinoma - abd  . Chronic anticoagulation   . Endocarditis   . Family history of hiatal hernia   . Headache    migraines  . Heart murmur   . Hemorrhoids   . History of kidney stones 2018   2 stones- /w CAT scan, not yet passed   . Hyperlipidemia   . MVP (mitral valve prolapse)   .  Pneumonia 2017   post op after - R TKR  . PONV (postoperative nausea and vomiting)    with dizziness   . Proctitis   . S/P MVR (mitral valve replacement)   . Tinnitus   . Urinary frequency   . Weak urinary stream     Family History  Problem Relation Age of Onset  . Hypertension Mother   . Leukemia Mother     Past Surgical History:  Procedure Laterality Date  . APPENDECTOMY  1970  . INGUINAL HERNIA REPAIR Right 10/26/2016   Procedure: RIGHT INGUINAL HERNIA REPAIR WITH MESH;  Surgeon: Jackolyn Confer, MD;  Location: [facility];  Service: General;  Laterality: Right;  GENERAL AND LMA   . INSERTION OF MESH Right 10/26/2016   Procedure: INSERTION OF MESH;  Surgeon: Jackolyn Confer, MD;  Location: [facility];  Service: General;  Laterality: Right;   . MITRAL VALVE REPAIR    . MITRAL VALVE REPLACEMENT    . right knee arthroscopy     times 2  . TONSILLECTOMY    . TOTAL KNEE ARTHROPLASTY Right 11/07/2015   Procedure: RIGHT TOTAL KNEE ARTHROPLASTY;  Surgeon: Gaynelle Arabian, MD;  [facility];  Service: Orthopedics;  Laterality: Right;   Social History   Occupational History  . Not on file  Tobacco Use  . Smoking status: Never Smoker  . Smokeless tobacco: Never Used  Vaping Use  . Vaping Use: Never used  Substance and Sexual Activity  . Alcohol use: Yes    Comment: 1 drink 5 times per week   . Drug use: No  . Sexual activity: Yes

## 2020-07-29 ENCOUNTER — Other Ambulatory Visit: Payer: Self-pay

## 2020-07-29 ENCOUNTER — Ambulatory Visit (INDEPENDENT_AMBULATORY_CARE_PROVIDER_SITE_OTHER): Payer: PPO | Admitting: Sports Medicine

## 2020-07-29 VITALS — BP 121/76 | HR 72 | Resp 20 | Ht 72.0 in | Wt 189.4 lb

## 2020-07-29 DIAGNOSIS — Z23 Encounter for immunization: Secondary | ICD-10-CM | POA: Diagnosis not present

## 2020-07-29 DIAGNOSIS — Z0000 Encounter for general adult medical examination without abnormal findings: Secondary | ICD-10-CM

## 2020-07-29 NOTE — Addendum Note (Signed)
Addended by: Lynnea Ferrier R on: 07/29/2020 03:15 PM   Modules accepted: Orders

## 2020-07-29 NOTE — Assessment & Plan Note (Signed)
Routine physical as above, labs look good, we are working on his knee, trying to get SynovaSure so that we can get a better idea as to the likelihood of periprosthetic joint infection, Prevnar 20 today. Return to see me in a year for this.

## 2020-07-29 NOTE — Progress Notes (Signed)
Subjective:    CC: Annual Physical Exam  HPI:  This patient is here for their annual physical  I reviewed the past medical history, family history, social history, surgical history, and allergies today and no changes were needed.  Please see the problem list section below in epic for further details.  Past Medical History: Past Medical History:  Diagnosis Date   Arthritis    R knee- , not symptomatic but reports DJD   Baker's cyst    right leg    Bladder infection, chronic    post op - knee replacement, has  had 3 rounds of antibiotics    BPH (benign prostatic hypertrophy)    Cancer ([facility])    basal cell carcinoma - abd   Chronic anticoagulation    Endocarditis    Family history of hiatal hernia    Headache    migraines   Heart murmur    Hemorrhoids    History of kidney stones 2018   2 stones- /w CAT scan, not yet passed    Hyperlipidemia    MVP (mitral valve prolapse)    Pneumonia 2017   post op after - R TKR   PONV (postoperative nausea and vomiting)    with dizziness    Proctitis    S/P MVR (mitral valve replacement)    Tinnitus    Urinary frequency    Weak urinary stream    Past Surgical History: Past Surgical History:  Procedure Laterality Date   APPENDECTOMY  1970   INGUINAL HERNIA REPAIR Right 10/26/2016   Procedure: RIGHT INGUINAL HERNIA REPAIR WITH MESH;  Surgeon: Jackolyn Confer, MD;  Location: [facility];  Service: General;  Laterality: Right;  GENERAL AND LMA    INSERTION OF MESH Right 10/26/2016   Procedure: INSERTION OF MESH;  Surgeon: Jackolyn Confer, MD;  Location: [facility];  Service: General;  Laterality: Right;   MITRAL VALVE REPAIR     MITRAL VALVE REPLACEMENT     right knee arthroscopy     times 2   TONSILLECTOMY     TOTAL KNEE ARTHROPLASTY Right 11/07/2015   Procedure: RIGHT TOTAL KNEE ARTHROPLASTY;  Surgeon: Gaynelle Arabian, MD;  Location[facility];  Service: Orthopedics;  Laterality: Right;   Social History: Social History   Socioeconomic History    Marital status: Married    Spouse name: Not on file   Number of children: Not on file   Years of education: Not on file   Highest education level: Not on file  Occupational History   Not on file  Tobacco Use   Smoking status: Never   Smokeless tobacco: Never  Vaping Use   Vaping Use: Never used  Substance and Sexual Activity   Alcohol use: Yes    Comment: 1 drink 5 times per week    Drug use: No   Sexual activity: Yes  Other Topics Concern   Not on file  Social History Narrative   Not on file   Social Determinants of Health   Financial Resource Strain: Not on file  Food Insecurity: Not on file  Transportation Needs: Not on file  Physical Activity: Not on file  Stress: Not on file  Social Connections: Not on file   Family History: Family History  Problem Relation Age of Onset   Hypertension Mother    Leukemia Mother    Allergies: Allergies  Allergen Reactions   Benzonatate Other (See Comments)    Headaches    Other Nausea And Vomiting  general anesthesia     Cephalosporins Hives and Itching   Medications: See med rec.  Review of Systems: No headache, visual changes, nausea, vomiting, diarrhea, constipation, dizziness, abdominal pain, skin rash, fevers, chills, night sweats, swollen lymph nodes, weight loss, chest pain, body aches, joint swelling, muscle aches, shortness of breath, mood changes, visual or auditory hallucinations.  Objective:    General: Well Developed, well nourished, and in no acute distress.  Neuro: Alert and oriented x3, extra-ocular muscles intact, sensation grossly intact. Cranial nerves II through XII are intact, motor, sensory, and coordinative functions are all intact. HEENT: Normocephalic, atraumatic, pupils equal round reactive to light, neck supple, no masses, no lymphadenopathy, thyroid nonpalpable. Oropharynx, nasopharynx, external ear canals are unremarkable. Skin: Warm and dry, no rashes noted.  Cardiac: Regular rate and  rhythm, no murmurs rubs or gallops. (Classic replaced heart valve sounds) Respiratory: Clear to auscultation bilaterally. Not using accessory muscles, speaking in full sentences.  Abdominal: Soft, nontender, nondistended, positive bowel sounds, no masses, no organomegaly.  Musculoskeletal: Shoulder, elbow, wrist, hip, knee, ankle stable, and with full range of motion.  Well-healed arthroplasty scar right knee.  Impression and Recommendations:    The patient was counselled, risk factors were discussed, anticipatory guidance given.  Annual physical exam Routine physical as above, labs look good, we are working on his knee, trying to get SynovaSure so that we can get a better idea as to the likelihood of periprosthetic joint infection, Prevnar 20 today. Return to see me in a year for this.  ___________________________________________ Gwen Her. Dianah Field, M.D., ABFM., CAQSM. Primary Care and Sports Medicine  [facility]  Adjunct Professor of [place] [employer] [facility] of [employer]

## 2020-08-02 NOTE — Telephone Encounter (Signed)
[place]?

## 2020-08-03 NOTE — Telephone Encounter (Signed)
Awesome, lets get him scheduled.  I will forward this to front office.

## 2020-08-05 ENCOUNTER — Other Ambulatory Visit: Payer: Self-pay

## 2020-08-05 ENCOUNTER — Ambulatory Visit: Payer: Self-pay

## 2020-08-05 ENCOUNTER — Ambulatory Visit (INDEPENDENT_AMBULATORY_CARE_PROVIDER_SITE_OTHER): Payer: PPO

## 2020-08-05 ENCOUNTER — Ambulatory Visit (INDEPENDENT_AMBULATORY_CARE_PROVIDER_SITE_OTHER): Payer: PPO | Admitting: Sports Medicine

## 2020-08-05 DIAGNOSIS — Z96651 Presence of right artificial knee joint: Secondary | ICD-10-CM | POA: Diagnosis not present

## 2020-08-05 NOTE — Assessment & Plan Note (Addendum)
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 I also sent some off for standard culture with a 4-week hold for Propionibacterium acnes. We will be sure to send results of Synovasure testing to Dr. Erlinda Hong and Dr. Mayer Camel. He will follow this up with Dr. Erlinda Hong.  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

## 2020-08-05 NOTE — Addendum Note (Signed)
Addended by: Silverio Decamp on: 08/05/2020 02:47 PM   Modules accepted: Orders

## 2020-08-05 NOTE — Progress Notes (Addendum)
    Procedures performed today:    Procedure: Real-time Ultrasound Guided aspiration of right knee Device: Samsung HS60  Verbal informed consent obtained.  Time-out conducted.  Noted no overlying erythema, induration, or other signs of local infection.  Skin prepped in a sterile fashion.  Local anesthesia: Topical Ethyl chloride.  With sterile technique and under real time ultrasound guidance: Using an 18-gauge needle advanced into the suprapatellar recess and aspirated approximately 20 mL of slightly cloudy straw-colored fluid. Completed without difficulty  Advised to call if fevers/chills, erythema, induration, drainage, or persistent bleeding.  Images permanently stored and available for review in PACS.  Impression: Technically successful ultrasound guided injection.  The fluid was then transferred to SynovaSure vials and I also sent some off for standard culture with a 4-week hold for Propionibacterium acnes.  Independent interpretation of notes and tests performed by another provider:   None.  Brief History, Exam, Impression, and Recommendations:    History of arthroplasty of right knee 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 I also sent some off for standard culture with a 4-week hold for Propionibacterium acnes. We will be sure to send results of Synovasure testing to Dr. Erlinda Hong and Dr. Mayer Camel. He will follow this up with Dr. Erlinda Hong.  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    ___________________________________________ Gwen Her. Dianah Field, M.D., ABFM., CAQSM. Primary Care and [place] Instructor of [place] of [employer] [facility] of  Medicine

## 2020-08-05 NOTE — Telephone Encounter (Signed)
Pt scheduled  

## 2020-08-09 DIAGNOSIS — M25561 Pain in right knee: Secondary | ICD-10-CM | POA: Diagnosis not present

## 2020-08-09 DIAGNOSIS — Z96651 Presence of right artificial knee joint: Secondary | ICD-10-CM | POA: Diagnosis not present

## 2020-08-11 LAB — ANAEROBIC AND AEROBIC CULTURE
AER RESULT:: NO GROWTH
MICRO NUMBER:: 12022618
MICRO NUMBER:: 12022619
SPECIMEN QUALITY:: ADEQUATE
SPECIMEN QUALITY:: ADEQUATE

## 2020-08-23 DIAGNOSIS — Z952 Presence of prosthetic heart valve: Secondary | ICD-10-CM | POA: Diagnosis not present

## 2020-08-23 DIAGNOSIS — Z7901 Long term (current) use of anticoagulants: Secondary | ICD-10-CM | POA: Diagnosis not present

## 2020-09-06 DIAGNOSIS — M25561 Pain in right knee: Secondary | ICD-10-CM | POA: Diagnosis not present

## 2020-10-03 ENCOUNTER — Encounter: Payer: Self-pay | Admitting: Nurse Practitioner

## 2020-10-03 ENCOUNTER — Telehealth: Payer: PPO | Admitting: Nurse Practitioner

## 2020-10-03 DIAGNOSIS — U071 COVID-19: Secondary | ICD-10-CM

## 2020-10-03 MED ORDER — MOLNUPIRAVIR EUA 200MG CAPSULE: 4.0000 | ORAL_CAPSULE | Freq: Two times a day (BID) | ORAL | 0 refills | Status: AC

## 2020-10-03 NOTE — Progress Notes (Signed)
Virtual Visit Consent   Zachary Calderon, you are scheduled for a virtual visit with a [facility] provider today.     Just as with appointments in the office, your consent must be obtained to participate.  Your consent will be active for this visit and any virtual visit you may have with one of our providers in the next 365 days.     If you have a MyChart account, a copy of this consent can be sent to you electronically.  All virtual visits are billed to your insurance company just like a traditional visit in the office.    As this is a virtual visit, video technology does not allow for your provider to perform a traditional examination.  This may limit your provider's ability to fully assess your condition.  If your provider identifies any concerns that need to be evaluated in person or the need to arrange testing (such as labs, EKG, etc.), we will make arrangements to do so.     Although advances in technology are sophisticated, we cannot ensure that it will always work on either your end or our end.  If the connection with a video visit is poor, the visit may have to be switched to a telephone visit.  With either a video or telephone visit, we are not always able to ensure that we have a secure connection.     I need to obtain your verbal consent now.   Are you willing to proceed with your visit today?    Zachary Calderon has provided verbal consent on 10/03/2020 for a virtual visit (video or telephone).   Zachary Schneiders, FNP   Date: 10/03/2020 11:28 AM   Virtual Visit via Video Note   I, Zachary Calderon, connected with  Zachary Calderon  ([id number], 03/23/1955) on 10/03/20 at 11:15 AM EDT by a video-enabled telemedicine application and verified that I am speaking with the correct person using two identifiers.  Location: Patient: Virtual Visit Location Patient: Home Provider: Virtual Visit Location Provider: Office/Clinic   I discussed the limitations of evaluation and management by telemedicine and  the availability of in person appointments. The patient expressed understanding and agreed to proceed.    History of Present Illness: Zachary Calderon is a 65 y.o. who identifies as a male who was assigned male at birth, and is being seen today after testing positive for COVID-19 yesterday morning.   He has a sore throat muscle aches and a cough.   He contacted his PCP but was unable to be seen today and he scheduled virtual in order to discuss anti-viral medication.   History of Mitral Valve replacement.   Has had pneumonia in the past.   He has not had COVID in the past He has been vaccinated x3 with Levan Hurst so far.    Problems:  Patient Active Problem List   Diagnosis Date Noted   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   Skin lesion 07/05/2020   Sebaceous cyst 03/25/2020   Skin tag 06/20/2017   Medial epicondylitis, left 06/20/2017   Post-viral cough syndrome 06/14/2017   Inguinal hernia, right 07/20/2016   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   HAP (hospital-acquired pneumonia) 01/30/2016   External hemorrhoid 01/20/2016   History of arthroplasty of right knee 11/07/2015   Migraine with aura 07/22/2015   Primary osteoarthritis of right knee 06/24/2015   BPH (benign prostatic hyperplasia) 06/24/2015   Annual physical exam 06/24/2015   S/P MVR (mitral valve replacement)    Endocarditis  Hyperlipidemia    Chronic anticoagulation     Allergies:  Allergies  Allergen Reactions   Benzonatate Other (See Comments)    Headaches    Other Nausea And Vomiting    general anesthesia     Cephalosporins Hives and Itching   Medications:  Current Outpatient Medications:    aspirin-acetaminophen-caffeine (EXCEDRIN MIGRAINE) 250-250-65 MG tablet, Take 2-3 tablets by mouth 3 (three) times daily as needed for headache or migraine., Disp: , Rfl:    atorvastatin (LIPITOR) 20 MG tablet, Take 20 mg by mouth daily., Disp: , Rfl:    b complex vitamins tablet, Take 1 tablet by mouth  daily., Disp: , Rfl:    Calcium-Magnesium-Zinc (CAL-MAG-ZINC PO), Take 2 tablets by mouth daily., Disp: , Rfl:    CHROMIUM PICOLINATE PO, Take 1 tablet by mouth daily., Disp: , Rfl:    Omega-3 Fatty Acids (FISH OIL) 1000 MG CAPS, Take 2,000 mg by mouth daily., Disp: , Rfl:    warfarin (COUMADIN) 6 MG tablet, Take 6 mg by mouth daily., Disp: , Rfl:   Observations/Objective: Patient is well-developed, well-nourished in no acute distress.  Resting comfortably  at home.  Head is normocephalic, atraumatic.  No labored breathing.  Speech is clear and coherent with logical content.  Patient is alert and oriented at baseline.    Assessment and Plan: 1. COVID-19  - molnupiravir EUA 200 mg CAPS; Take 4 capsules (800 mg total) by mouth 2 (two) times daily for 5 days.  Dispense: 40 capsule; Refill: 0    Discussed OTC management of symptoms, f/u with PCP who will be copied on this note    Follow Up Instructions: I discussed the assessment and treatment plan with the patient. The patient was provided an opportunity to ask questions and all were answered. The patient agreed with the plan and demonstrated an understanding of the instructions.  A copy of instructions were sent to the patient via MyChart.  The patient was advised to call back or seek an in-person evaluation if the symptoms worsen or if the condition fails to improve as anticipated.  Time:  I spent 15 minutes with the patient via telehealth technology discussing the above problems/concerns.    Zachary Schneiders, FNP

## 2020-10-03 NOTE — Patient Instructions (Signed)
You are being prescribed MOLNUPIRAVIR for COVID-19 infection.  ° ° °Please call the pharmacy or go through the drive through vs going inside if you are picking up the mediation yourself to prevent further spread. If prescribed to a [facility] affiliated pharmacy, a pharmacist will bring the medication out to your car. ° ° °ADMINISTRATION INSTRUCTIONS: °Take with or without food. Swallow the tablets whole. Don't chew, crush, or break the medications because it might not work as well ° °For each dose of the medication, you should be taking FOUR tablets at one time, TWICE a day  ° °Finish your full five-day course of Molnupiravir even if you feel better before you're done. Stopping this medication too early can make it less effective to prevent severe illness related to COVID19.   ° °Molnupiravir is prescribed for YOU ONLY. Don't share it with others, even if they have similar symptoms as you. This medication might not be right for everyone.  ° °Make sure to take steps to protect yourself and others while you're taking this medication in order to get well soon and to prevent others from getting sick with COVID-19. ° ° °**If you are of childbearing potential (any gender) - it is advised to not get pregnant while taking this medication and recommended that condoms are used for male partners the next 3 months after taking the medication out of extreme caution  ° ° °COMMON SIDE EFFECTS: °Diarrhea °Nausea  °Dizziness ° ° ° °If your COVID-19 symptoms get worse, get medical help right away. Call 911 if you experience symptoms such as worsening cough, trouble breathing, chest pain that doesn't go away, confusion, a hard time staying awake, and pale or blue-colored skin. °This medication won't prevent all COVID-19 cases from getting worse.  ° ° °

## 2020-10-06 ENCOUNTER — Telehealth: Payer: PPO | Admitting: Medical-Surgical

## 2020-10-10 DIAGNOSIS — Z952 Presence of prosthetic heart valve: Secondary | ICD-10-CM | POA: Diagnosis not present

## 2020-10-10 DIAGNOSIS — Z7901 Long term (current) use of anticoagulants: Secondary | ICD-10-CM | POA: Diagnosis not present

## 2020-11-04 DIAGNOSIS — Z9889 Other specified postprocedural states: Secondary | ICD-10-CM | POA: Diagnosis not present

## 2020-11-04 DIAGNOSIS — M25561 Pain in right knee: Secondary | ICD-10-CM | POA: Diagnosis not present

## 2020-11-04 DIAGNOSIS — T8482XA Fibrosis due to internal orthopedic prosthetic devices, implants and grafts, initial encounter: Secondary | ICD-10-CM | POA: Diagnosis not present

## 2020-11-04 DIAGNOSIS — Z96651 Presence of right artificial knee joint: Secondary | ICD-10-CM | POA: Diagnosis not present

## 2020-11-08 DIAGNOSIS — M25561 Pain in right knee: Secondary | ICD-10-CM | POA: Diagnosis not present

## 2020-11-08 DIAGNOSIS — M25461 Effusion, right knee: Secondary | ICD-10-CM | POA: Diagnosis not present

## 2020-11-08 DIAGNOSIS — Z96651 Presence of right artificial knee joint: Secondary | ICD-10-CM | POA: Diagnosis not present

## 2020-11-08 DIAGNOSIS — Z9889 Other specified postprocedural states: Secondary | ICD-10-CM | POA: Diagnosis not present

## 2020-11-10 DIAGNOSIS — Z9889 Other specified postprocedural states: Secondary | ICD-10-CM | POA: Diagnosis not present

## 2020-11-10 DIAGNOSIS — M25461 Effusion, right knee: Secondary | ICD-10-CM | POA: Diagnosis not present

## 2020-11-21 DIAGNOSIS — Z952 Presence of prosthetic heart valve: Secondary | ICD-10-CM | POA: Diagnosis not present

## 2020-11-21 DIAGNOSIS — Z7901 Long term (current) use of anticoagulants: Secondary | ICD-10-CM | POA: Diagnosis not present

## 2020-11-27 ENCOUNTER — Other Ambulatory Visit: Payer: Self-pay

## 2020-11-27 ENCOUNTER — Encounter (HOSPITAL_BASED_OUTPATIENT_CLINIC_OR_DEPARTMENT_OTHER): Payer: Self-pay | Admitting: Emergency Medicine

## 2020-11-27 ENCOUNTER — Emergency Department (HOSPITAL_BASED_OUTPATIENT_CLINIC_OR_DEPARTMENT_OTHER): Payer: PPO

## 2020-11-27 ENCOUNTER — Emergency Department (HOSPITAL_COMMUNITY): Admission: EM | Admit: 2020-11-27 | Discharge: 2020-11-27 | Discharge status: Against Medical Advice | Payer: PPO

## 2020-11-27 ENCOUNTER — Emergency Department (HOSPITAL_BASED_OUTPATIENT_CLINIC_OR_DEPARTMENT_OTHER)
Admission: EM | Admit: 2020-11-27 | Discharge: 2020-11-27 | Disposition: A | Discharge status: Home / Self Care | Payer: PPO | Attending: Emergency Medicine | Admitting: Emergency Medicine

## 2020-11-27 ENCOUNTER — Ambulatory Visit (HOSPITAL_COMMUNITY)
Admission: EM | Admit: 2020-11-27 | Discharge: 2020-11-27 | Disposition: A | Discharge status: Nursing Home (Includes ICF) | Payer: PPO

## 2020-11-27 DIAGNOSIS — Z96651 Presence of right artificial knee joint: Secondary | ICD-10-CM | POA: Diagnosis not present

## 2020-11-27 DIAGNOSIS — R519 Headache, unspecified: Secondary | ICD-10-CM | POA: Insufficient documentation

## 2020-11-27 DIAGNOSIS — Z79899 Other long term (current) drug therapy: Secondary | ICD-10-CM | POA: Insufficient documentation

## 2020-11-27 DIAGNOSIS — Z7901 Long term (current) use of anticoagulants: Secondary | ICD-10-CM | POA: Diagnosis not present

## 2020-11-27 DIAGNOSIS — H539 Unspecified visual disturbance: Secondary | ICD-10-CM | POA: Diagnosis not present

## 2020-11-27 DIAGNOSIS — R42 Dizziness and giddiness: Secondary | ICD-10-CM | POA: Diagnosis not present

## 2020-11-27 DIAGNOSIS — Z85828 Personal history of other malignant neoplasm of skin: Secondary | ICD-10-CM | POA: Insufficient documentation

## 2020-11-27 LAB — RAPID URINE DRUG SCREEN, HOSP PERFORMED
Amphetamines: NOT DETECTED
Barbiturates: NOT DETECTED
Benzodiazepines: NOT DETECTED
Cocaine: NOT DETECTED
Opiates: NOT DETECTED
Tetrahydrocannabinol: NOT DETECTED

## 2020-11-27 LAB — URINALYSIS, ROUTINE W REFLEX MICROSCOPIC
Bilirubin Urine: NEGATIVE
Glucose, UA: NEGATIVE mg/dL
Hgb urine dipstick: NEGATIVE
Ketones, ur: NEGATIVE mg/dL
Leukocytes,Ua: NEGATIVE
Nitrite: NEGATIVE
Protein, ur: NEGATIVE mg/dL
Specific Gravity, Urine: 1.022 (ref 1.005–1.030)
pH: 6 (ref 5.0–8.0)

## 2020-11-27 LAB — PROTIME-INR
INR: 2.1 — ABNORMAL HIGH (ref 0.8–1.2)
Prothrombin Time: 23.2 seconds — ABNORMAL HIGH (ref 11.4–15.2)

## 2020-11-27 LAB — BASIC METABOLIC PANEL
Anion gap: 7 (ref 5–15)
BUN: 17 mg/dL (ref 8–23)
CO2: 28 mmol/L (ref 22–32)
Calcium: 9.1 mg/dL (ref 8.9–10.3)
Chloride: 105 mmol/L (ref 98–111)
Creatinine, Ser: 0.91 mg/dL (ref 0.61–1.24)
GFR, Estimated: >60 mL/min (ref >60)
Glucose, Bld: 87 mg/dL (ref 70–99)
Potassium: 4.1 mmol/L (ref 3.5–5.1)
Sodium: 140 mmol/L (ref 135–145)

## 2020-11-27 LAB — CBC
HCT: 38.2 % — ABNORMAL LOW (ref 39.0–52.0)
Hemoglobin: 12.8 g/dL — ABNORMAL LOW (ref 13.0–17.0)
MCH: 28.8 pg (ref 26.0–34.0)
MCHC: 33.5 g/dL (ref 30.0–36.0)
MCV: 86 fL (ref 80.0–100.0)
Platelets: 174 10*3/uL (ref 150–400)
RBC: 4.44 MIL/uL (ref 4.22–5.81)
RDW: 12.4 % (ref 11.5–15.5)
WBC: 5.3 10*3/uL (ref 4.0–10.5)
nRBC: 0 % (ref 0.0–0.2)

## 2020-11-27 LAB — ETHANOL: Alcohol, Ethyl (B): 11 mg/dL — ABNORMAL HIGH (ref <10)

## 2020-11-27 LAB — APTT: aPTT: 36 seconds (ref 24–36)

## 2020-11-27 NOTE — ED Triage Notes (Signed)
Pt presents with dizziness, double vision x 2 hours.   States 1 hour ago he was walking and started to have double vision and feeling unsteady, States he sat down and states the sxs last 5-10 mins. States he never had the sxs before.   Pt denies chest pain, SOB  and arm pain.

## 2020-11-27 NOTE — ED Notes (Signed)
IV removed from pt's left AC. IV site was clean, dry, and intact.

## 2020-11-27 NOTE — ED Notes (Signed)
No answer x 4  

## 2020-11-27 NOTE — ED Provider Notes (Signed)
[facility] EMERGENCY DEPT Provider Note   CSN: [id number] Arrival date & time: 11/27/20  1456     History Chief Complaint  Patient presents with   Dizziness    Zachary Calderon is a 65 y.o. male.  Pt presents to the ED today with an episode of double vision and dizziness today while walking.  Pt said sx lasted about 5-10 minutes, then resolved.  The pt denies room spinning.  He has tinnitus normally.  He does have a headache.  Pt is on coumadin for a mechanical artificial mitral valve.  Pt denies any other neurological sx.  Pt initially went to [facility], but was advised to come here due to the long wait there.      Past Medical History:  Diagnosis Date   Arthritis    R knee- , not symptomatic but reports DJD   Baker's cyst    right leg    Bladder infection, chronic    post op - knee replacement, has  had 3 rounds of antibiotics    BPH (benign prostatic hypertrophy)    Cancer (HCC)    basal cell carcinoma - abd   Chronic anticoagulation    Endocarditis    Family history of hiatal hernia    Headache    migraines   Heart murmur    Hemorrhoids    History of kidney stones 2018   2 stones- /w CAT scan, not yet passed    Hyperlipidemia    MVP (mitral valve prolapse)    Pneumonia 2017   post op after - R TKR   PONV (postoperative nausea and vomiting)    with dizziness    Proctitis    S/P MVR (mitral valve replacement)    Tinnitus    Urinary frequency    Weak urinary stream     Patient Active Problem List   Diagnosis Date Noted   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   Skin lesion 07/05/2020   Sebaceous cyst 03/25/2020   Skin tag 06/20/2017   Medial epicondylitis, left 06/20/2017   Post-viral cough syndrome 06/14/2017   Inguinal hernia, right 07/20/2016   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   HAP (hospital-acquired pneumonia) 01/30/2016   External hemorrhoid 01/20/2016   History of arthroplasty of right knee 11/07/2015   Migraine with aura  07/22/2015   Primary osteoarthritis of right knee 06/24/2015   BPH (benign prostatic hyperplasia) 06/24/2015   Annual physical exam 06/24/2015   S/P MVR (mitral valve replacement)    Endocarditis    Hyperlipidemia    Chronic anticoagulation     Past Surgical History:  Procedure Laterality Date   APPENDECTOMY  1970   INGUINAL HERNIA REPAIR Right 10/26/2016   Procedure: RIGHT INGUINAL HERNIA REPAIR WITH MESH;  Surgeon: Jackolyn Confer, MD;  Location: [facility];  Service: General;  Laterality: Right;  GENERAL AND LMA    INSERTION OF MESH Right 10/26/2016   Procedure: INSERTION OF MESH;  Surgeon: Jackolyn Confer, MD;  Location: [facility];  Service: General;  Laterality: Right;   KNEE SURGERY     MITRAL VALVE REPAIR     MITRAL VALVE REPLACEMENT     right knee arthroscopy     times 2   TONSILLECTOMY     TOTAL KNEE ARTHROPLASTY Right 11/07/2015   Procedure: RIGHT TOTAL KNEE ARTHROPLASTY;  Surgeon: Gaynelle Arabian, MD;  Location: [facility];  Service: Orthopedics;  Laterality: Right;       Family History  Problem Relation Age of Onset   Hypertension  Mother    Leukemia Mother     Social History   Tobacco Use   Smoking status: Never   Smokeless tobacco: Never  Vaping Use   Vaping Use: Never used  Substance Use Topics   Alcohol use: Yes    Comment: 1 drink 5 times per week    Drug use: No    Home Medications Prior to Admission medications   Medication Sig Start Date End Date Taking? Authorizing Provider  aspirin-acetaminophen-caffeine (EXCEDRIN MIGRAINE) [phone] MG tablet Take 2-3 tablets by mouth 3 (three) times daily as needed for headache or migraine.    [provider]  atorvastatin (LIPITOR) 20 MG tablet Take 20 mg by mouth daily. 03/09/20   [provider]  atorvastatin (LIPITOR) 20 MG tablet Take 1 tablet by mouth daily. 11/30/19 11/29/20  [provider]  b complex vitamins tablet Take 1 tablet by mouth daily.    [provider]   Calcium-Magnesium-Zinc (CAL-MAG-ZINC PO) Take 2 tablets by mouth daily.    [provider]  Cholecalciferol 125 MCG (5000 UT) TABS Take by mouth.    [provider]  CHROMIUM PICOLINATE PO Take 1 tablet by mouth daily.    [provider]  co-enzyme Q-10 30 MG capsule Take by mouth.    [provider]  FA-B6-B12-D-Omega 3-Phytoster (ANIMI-3) 1 MG CAPS Take by mouth.    [provider]  ibuprofen (ADVIL) 800 MG tablet Take by mouth.    [provider]  Omega-3 Fatty Acids (FISH OIL) 1000 MG CAPS Take 2,000 mg by mouth daily.    [provider]  warfarin (COUMADIN) 6 MG tablet Take 6 mg by mouth daily. 03/09/20   [provider]    Allergies    Benzonatate, Levofloxacin, Other, and Cephalosporins  Review of Systems   Review of Systems  Eyes:  Positive for visual disturbance.  Neurological:  Positive for dizziness.  All other systems reviewed and are negative.  Physical Exam Updated Vital Signs BP (!) 140/92 (BP Location: Right Arm)   Pulse 67   Temp 97.9 F (36.6 C) (Oral)   Resp 12   Ht 6' (1.829 m)   Wt 88 kg   SpO2 100%   BMI 26.31 kg/m   Physical Exam Vitals and nursing note reviewed.  Constitutional:      Appearance: Normal appearance.  HENT:     Head: Normocephalic and atraumatic.     Right Ear: External ear normal.     Left Ear: External ear normal.     Nose: Nose normal.     Mouth/Throat:     Mouth: Mucous membranes are moist.     Pharynx: Oropharynx is clear.  Eyes:     Extraocular Movements: Extraocular movements intact.     Conjunctiva/sclera: Conjunctivae normal.     Pupils: Pupils are equal, round, and reactive to light.  Cardiovascular:     Rate and Rhythm: Normal rate and regular rhythm.     Pulses: Normal pulses.     Heart sounds: Normal heart sounds.  Pulmonary:     Effort: Pulmonary effort is normal.     Breath sounds: Normal breath sounds.  Abdominal:     General: Abdomen  is flat. Bowel sounds are normal.     Palpations: Abdomen is soft.  Musculoskeletal:        General: Normal range of motion.     Cervical back: Normal range of motion and neck supple.  Skin:    General: Skin  is warm.     Capillary Refill: Capillary refill takes less than 2 seconds.  Neurological:     General: No focal deficit present.     Mental Status: He is alert and oriented to person, place, and time.  Psychiatric:        Mood and Affect: Mood normal.        Behavior: Behavior normal.        Thought Content: Thought content normal.        Judgment: Judgment normal.    ED Results / Procedures / Treatments   Labs (all labs ordered are listed, but only abnormal results are displayed) Labs Reviewed  CBC - Abnormal; Notable for the following components:      Result Value   Hemoglobin 12.8 (*)    HCT 38.2 (*)    All other components within normal limits  ETHANOL - Abnormal; Notable for the following components:   Alcohol, Ethyl (B) 11 (*)    All other components within normal limits  PROTIME-INR - Abnormal; Notable for the following components:   Prothrombin Time 23.2 (*)    INR 2.1 (*)    All other components within normal limits  BASIC METABOLIC PANEL  URINALYSIS, ROUTINE W REFLEX MICROSCOPIC  APTT  RAPID URINE DRUG SCREEN, HOSP PERFORMED    EKG EKG Interpretation  Date/Time:  Sunday November 27 2020 15:38:05 EDT Ventricular Rate:  68 PR Interval:  194 QRS Duration: 100 QT Interval:  417 QTC Calculation: 444 R Axis:   5 Text Interpretation: Sinus rhythm Ventricular premature complex Baseline wander in lead(s) V2 No significant change since last tracing Confirmed by Isla Pence [phone]) on 11/27/2020 5:13:51 PM  Radiology CT HEAD WO CONTRAST  Result Date: 11/27/2020 CLINICAL DATA:  Dizziness, double vision EXAM: CT HEAD WITHOUT CONTRAST TECHNIQUE: Contiguous axial images were obtained from the base of the skull through the vertex without intravenous contrast.  COMPARISON:  None. FINDINGS: Brain: No acute intracranial abnormality. Specifically, no hemorrhage, hydrocephalus, mass lesion, acute infarction, or significant intracranial injury. Vascular: No hyperdense vessel or unexpected calcification. Skull: No acute calvarial abnormality. Sinuses/Orbits: No acute findings Other: None IMPRESSION: No acute intracranial abnormality. Electronically Signed   By: Rolm Baptise M.D.   On: 11/27/2020 16:43    Procedures Procedures   Medications Ordered in ED Medications - No data to display  ED Course  I have reviewed the triage vital signs and the nursing notes.  Pertinent labs & imaging results that were available during my care of the patient were reviewed by me and considered in my medical decision making (see chart for details).    MDM Rules/Calculators/A&P                           Pt has not had any further problems.  CT normal.  Labs nl.  He is appropriately anticoagulated on his coumadin.  He is stable for d/c.  Return if worse.   Final Clinical Impression(s) / ED Diagnoses Final diagnoses:  Visual disturbance  Anticoagulated on Coumadin    Rx / DC Orders ED Discharge Orders     None        Isla Pence, MD 11/27/20 1717

## 2020-11-27 NOTE — ED Notes (Signed)
No answer in lobby.

## 2020-11-27 NOTE — ED Notes (Signed)
Pt reports very sudden onset double vision and loss of balance while walking; pt sat down, and episode lasted approx 5-10 min.  Vision is back to normal now per pt, but c/o mild posterior HA remaining.  Reports taking Coumadin (denies injury), but recently had knee surgery, so dosing is currently being adjusted back to normal. Discussed with Olene Craven, PA -- pt to have eval in ED.  Pt verbalized understanding.  Patient is being discharged from the [facility] and sent to the Emergency Department via private vehicle. Per L. Phillip Heal, [place], patient is stable but in need of higher level of care due to sudden onset vision change and HA, hx of taking anticoagulants. Patient is aware and verbalizes understanding of plan of care.  Vitals:   11/27/20 1346 11/27/20 1348  BP: 126/83   Pulse:    Resp:  18  Temp:    SpO2:

## 2020-11-27 NOTE — ED Triage Notes (Signed)
Pt reports sudden onset of double vision and dizziness while out walking this morning.  Pt takes warfarin for artificial valve.  Pt states symptoms lasted about 5-10 minutes then resolved.  Pt reports mild posterior headache since episode.  Pt went to [facility] and advised to come to ED.  Pt went to [facility] ED and came to this facility when advised there would be a long wait at [facility].

## 2020-11-29 DIAGNOSIS — E782 Mixed hyperlipidemia: Secondary | ICD-10-CM | POA: Diagnosis not present

## 2020-11-29 DIAGNOSIS — R42 Dizziness and giddiness: Secondary | ICD-10-CM | POA: Diagnosis not present

## 2020-11-29 DIAGNOSIS — Z7901 Long term (current) use of anticoagulants: Secondary | ICD-10-CM | POA: Diagnosis not present

## 2020-11-29 DIAGNOSIS — Z48812 Encounter for surgical aftercare following surgery on the circulatory system: Secondary | ICD-10-CM | POA: Diagnosis not present

## 2020-11-29 DIAGNOSIS — Z79899 Other long term (current) drug therapy: Secondary | ICD-10-CM | POA: Diagnosis not present

## 2020-11-29 DIAGNOSIS — E785 Hyperlipidemia, unspecified: Secondary | ICD-10-CM | POA: Diagnosis not present

## 2020-11-29 DIAGNOSIS — Z952 Presence of prosthetic heart valve: Secondary | ICD-10-CM | POA: Diagnosis not present

## 2021-01-09 DIAGNOSIS — Z7901 Long term (current) use of anticoagulants: Secondary | ICD-10-CM | POA: Diagnosis not present

## 2021-01-09 DIAGNOSIS — Z952 Presence of prosthetic heart valve: Secondary | ICD-10-CM | POA: Diagnosis not present

## 2021-03-09 DIAGNOSIS — Z952 Presence of prosthetic heart valve: Secondary | ICD-10-CM | POA: Diagnosis not present

## 2021-03-09 DIAGNOSIS — Z7901 Long term (current) use of anticoagulants: Secondary | ICD-10-CM | POA: Diagnosis not present

## 2021-04-26 DIAGNOSIS — M25561 Pain in right knee: Secondary | ICD-10-CM | POA: Diagnosis not present

## 2021-05-11 ENCOUNTER — Encounter: Payer: Self-pay | Admitting: Sports Medicine

## 2021-05-22 ENCOUNTER — Encounter: Payer: Self-pay | Admitting: Sports Medicine

## 2021-05-22 DIAGNOSIS — Z952 Presence of prosthetic heart valve: Secondary | ICD-10-CM | POA: Diagnosis not present

## 2021-05-22 DIAGNOSIS — Z7901 Long term (current) use of anticoagulants: Secondary | ICD-10-CM | POA: Diagnosis not present

## 2021-06-03 ENCOUNTER — Encounter (HOSPITAL_COMMUNITY): Payer: Self-pay | Admitting: *Deleted

## 2021-06-03 ENCOUNTER — Other Ambulatory Visit: Payer: Self-pay

## 2021-06-03 ENCOUNTER — Ambulatory Visit (HOSPITAL_COMMUNITY)
Admission: EM | Admit: 2021-06-03 | Discharge: 2021-06-03 | Disposition: A | Discharge status: Home / Self Care | Payer: PPO | Attending: Emergency Medicine | Admitting: Emergency Medicine

## 2021-06-03 DIAGNOSIS — J069 Acute upper respiratory infection, unspecified: Secondary | ICD-10-CM

## 2021-06-03 MED ORDER — PREDNISONE 20 MG PO TABS: 40.0000 mg | ORAL_TABLET | Freq: Every day | ORAL | 0 refills | Status: DC

## 2021-06-03 NOTE — ED Provider Notes (Signed)
[facility]    CSN: [id number] Arrival date & time: 06/03/21  1002      History   Chief Complaint Chief Complaint  Patient presents with   Cough    HPI Zachary Calderon is a 66 y.o. male.   Patient presents with a nonproductive cough with wheezing for 30 days symptoms initially were accompanied by a sore throat and rhinorrhea 5 days ago, the symptoms have resolved.  Tolerating food and liquids.  No known sick contacts.  Has attempted use of Sudafed, DayQuil and NyQuil which have been minimally helpful.  Denies shortness of breath, fever, chills or body, ear pain, headaches, abdominal pain, nausea, vomiting, diarrhea.   Past Medical History:  Diagnosis Date   Arthritis    R knee- , not symptomatic but reports DJD   Baker's cyst    right leg    Bladder infection, chronic    post op - knee replacement, has  had 3 rounds of antibiotics    BPH (benign prostatic hypertrophy)    Cancer (HCC)    basal cell carcinoma - abd   Chronic anticoagulation    Endocarditis    Family history of hiatal hernia    Headache    migraines   Heart murmur    Hemorrhoids    History of kidney stones 2018   2 stones- /w CAT scan, not yet passed    Hyperlipidemia    MVP (mitral valve prolapse)    Pneumonia 2017   post op after - R TKR   PONV (postoperative nausea and vomiting)    with dizziness    Proctitis    S/P MVR (mitral valve replacement)    Tinnitus    Urinary frequency    Weak urinary stream     Patient Active Problem List   Diagnosis Date Noted   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   Skin lesion 07/05/2020   Sebaceous cyst 03/25/2020   Skin tag 06/20/2017   Medial epicondylitis, left 06/20/2017   Post-viral cough syndrome 06/14/2017   Inguinal hernia, right 07/20/2016   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   HAP (hospital-acquired pneumonia) 01/30/2016   External hemorrhoid 01/20/2016   History of arthroplasty of right knee 11/07/2015   Migraine with  aura 07/22/2015   Primary osteoarthritis of right knee 06/24/2015   BPH (benign prostatic hyperplasia) 06/24/2015   Annual physical exam 06/24/2015   S/P MVR (mitral valve replacement)    Endocarditis    Hyperlipidemia    Chronic anticoagulation     Past Surgical History:  Procedure Laterality Date   APPENDECTOMY  1970   INGUINAL HERNIA REPAIR Right 10/26/2016   Procedure: RIGHT INGUINAL HERNIA REPAIR WITH MESH;  Surgeon: Avel Peace, MD;  [facility];  Service: General;  Laterality: Right;  GENERAL AND LMA    INSERTION OF MESH Right 10/26/2016   Procedure: INSERTION OF MESH;  Surgeon: Avel Peace, MD;  [facility]: [facility];  Service: General;  Laterality: Right;   KNEE SURGERY     MITRAL VALVE REPAIR     MITRAL VALVE REPLACEMENT     right knee arthroscopy     times 2   TONSILLECTOMY     TOTAL KNEE ARTHROPLASTY Right 11/07/2015   Procedure: RIGHT TOTAL KNEE ARTHROPLASTY;  Surgeon: Ollen Gross, MD;  Location: [facility];  Service: Orthopedics;  Laterality: Right;       Home Medications    Prior to Admission medications   Medication Sig Start Date End Date Taking? Authorizing Provider  aspirin-acetaminophen-caffeine (EXCEDRIN MIGRAINE) 250-250-65 MG tablet Take 2-3 tablets by mouth 3 (three) times daily as needed for headache or migraine.    [provider]  atorvastatin (LIPITOR) 20 MG tablet Take 20 mg by mouth daily. 03/09/20   [provider]  b complex vitamins tablet Take 1 tablet by mouth daily.    [provider]  Calcium-Magnesium-Zinc (CAL-MAG-ZINC PO) Take 2 tablets by mouth daily.    [provider]  Cholecalciferol 125 MCG (5000 UT) TABS Take by mouth.    [provider]  CHROMIUM PICOLINATE PO Take 1 tablet by mouth daily.    [provider]  co-enzyme Q-10 30 MG capsule Take by mouth.    [provider]  FA-B6-B12-D-Omega 3-Phytoster (ANIMI-3) 1 MG CAPS Take by mouth.    [provider]   ibuprofen (ADVIL) 800 MG tablet Take by mouth.    [provider]  Omega-3 Fatty Acids (FISH OIL) 1000 MG CAPS Take 2,000 mg by mouth daily.    [provider]  warfarin (COUMADIN) 6 MG tablet Take 6 mg by mouth daily. 03/09/20   [provider]    Family History Family History  Problem Relation Age of Onset   Hypertension Mother    Leukemia Mother     Social History Social History   Tobacco Use   Smoking status: Never   Smokeless tobacco: Never  Vaping Use   Vaping Use: Never used  Substance Use Topics   Alcohol use: Yes    Comment: 1 drink 5 times per week    Drug use: No     Allergies   Benzonatate, Levofloxacin, Other, and Cephalosporins   Review of Systems Review of Systems  Constitutional: Negative.   HENT:  Positive for rhinorrhea and sore throat. Negative for congestion, dental problem, drooling, ear discharge, ear pain, facial swelling, hearing loss, mouth sores, nosebleeds, postnasal drip, sinus pressure, sinus pain, sneezing, tinnitus, trouble swallowing and voice change.   Respiratory:  Positive for cough and wheezing. Negative for apnea, choking, chest tightness, shortness of breath and stridor.   Cardiovascular: Negative.   Gastrointestinal: Negative.   Musculoskeletal:  Positive for myalgias. Negative for arthralgias, back pain, gait problem, joint swelling, neck pain and neck stiffness.  Skin: Negative.   Neurological: Negative.     Physical Exam Triage Vital Signs ED Triage Vitals  Enc Vitals Group     BP 06/03/21 1016 (!) 145/81     Pulse Rate 06/03/21 1016 83     Resp 06/03/21 1016 18     Temp 06/03/21 1016 98.8 F (37.1 C)     Temp src --      SpO2 06/03/21 1016 99 %     Weight --      Height --      Head Circumference --      Peak Flow --      Pain Score 06/03/21 1014 0     Pain Loc --      Pain Edu? --      Excl. in GC? --    No data found.  Updated Vital Signs BP (!) 145/81   Pulse 83   Temp 98.8 F  (37.1 C)   Resp 18   SpO2 99%   Visual Acuity Right Eye Distance:   Left Eye Distance:   Bilateral Distance:    Right Eye Near:   Left Eye Near:    Bilateral Near:     Physical Exam Constitutional:  Appearance: Normal appearance.  HENT:     Head: Normocephalic.     Right Ear: Tympanic membrane, ear canal and external ear normal.     Left Ear: Tympanic membrane, ear canal and external ear normal.     Nose: Nose normal.     Mouth/Throat:     Mouth: Mucous membranes are moist.     Pharynx: Oropharynx is clear.  Eyes:     Extraocular Movements: Extraocular movements intact.  Cardiovascular:     Rate and Rhythm: Normal rate and regular rhythm.     Pulses: Normal pulses.     Heart sounds: Normal heart sounds.  Pulmonary:     Effort: Pulmonary effort is normal.     Breath sounds: Normal breath sounds.  Skin:    General: Skin is warm and dry.  Neurological:     Mental Status: He is alert and oriented to person, place, and time. Mental status is at baseline.  Psychiatric:        Mood and Affect: Mood normal.        Behavior: Behavior normal.     UC Treatments / Results  Labs (all labs ordered are listed, but only abnormal results are displayed) Labs Reviewed - No data to display  EKG   Radiology No results found.  Procedures Procedures (including critical care time)  Medications Ordered in UC Medications - No data to display  Initial Impression / Assessment and Plan / UC Course  I have reviewed the triage vital signs and the nursing notes.  Pertinent labs & imaging results that were available during my care of the patient were reviewed by me and considered in my medical decision making (see chart for details).  Viral URI with cough  Vital signs are stable, O2 saturation 99% on room air, lungs are clear to auscultation however as patient attests to wheezing we will move forward with symptomatic treatment, prednisone 40 mg burst prescribed to reduce  inflammation to the upper airways, will defer use of antibiotic at this time as symptoms have already begun to improved, discussed with patient , confirmed allergy to Tessalon, recommended over-the-counter Delsym for further management, recommended humidifier, steam bathrooms, increase fluid intake and teaspoons of honey for additional support, may follow-up with urgent care as needed for persisting symptoms Final Clinical Impressions(s) / UC Diagnoses   Final diagnoses:  None   Discharge Instructions   None    ED Prescriptions   None    PDMP not reviewed this encounter.   Valinda Hoar, NP 06/03/21 1132

## 2021-06-03 NOTE — ED Triage Notes (Signed)
Pt reports sx's started earlier in the week. Pt first had a sore throat which is better now. Pt reports now he has a prductive cough. ?

## 2021-06-03 NOTE — Discharge Instructions (Signed)
Your symptoms today are most likely being caused by a virus and should steadily improve in time it can take up to 7 to 10 days before you truly start to see a turnaround however things will get better ? ?Your symptoms are already beginning to improve as your sore throat and runny nose have resolved ? ?To assist with your cough take prednisone every morning with food for the next 5 days, this medicine will help to reduce any irritation and inflammation to the upper airways ? ?For cough: honey 1/2 to 1 teaspoon (you can dilute the honey in water or another fluid).  You can also use guaifenesin and dextromethorphan ( Delsym)  for cough. You can use a humidifier for chest congestion and cough.  If you don't have a humidifier, you can sit in the bathroom with the hot shower running.    ?  ?It is important to stay hydrated: drink plenty of fluids (water, gatorade/powerade/pedialyte, juices, or teas) to keep your throat moisturized and help further relieve irritation/discomfort.' ?

## 2021-07-18 DIAGNOSIS — Z7901 Long term (current) use of anticoagulants: Secondary | ICD-10-CM | POA: Diagnosis not present

## 2021-07-18 DIAGNOSIS — Z952 Presence of prosthetic heart valve: Secondary | ICD-10-CM | POA: Diagnosis not present

## 2021-08-04 ENCOUNTER — Encounter: Payer: PPO | Admitting: Sports Medicine

## 2021-08-11 ENCOUNTER — Encounter: Payer: PPO | Admitting: Sports Medicine

## 2021-08-16 ENCOUNTER — Encounter: Payer: Self-pay | Admitting: Sports Medicine

## 2021-08-28 ENCOUNTER — Encounter: Payer: Self-pay | Admitting: Sports Medicine

## 2021-08-28 DIAGNOSIS — Z0000 Encounter for general adult medical examination without abnormal findings: Secondary | ICD-10-CM

## 2021-08-28 DIAGNOSIS — N401 Enlarged prostate with lower urinary tract symptoms: Secondary | ICD-10-CM

## 2021-08-28 DIAGNOSIS — E7800 Pure hypercholesterolemia, unspecified: Secondary | ICD-10-CM

## 2021-08-28 NOTE — Telephone Encounter (Signed)
Annual labs pended.

## 2021-08-28 NOTE — Telephone Encounter (Signed)
Signed off on orders, he would like them faxed to [facility] lab on [address].

## 2021-08-30 DIAGNOSIS — R350 Frequency of micturition: Secondary | ICD-10-CM | POA: Diagnosis not present

## 2021-08-30 DIAGNOSIS — Z0000 Encounter for general adult medical examination without abnormal findings: Secondary | ICD-10-CM | POA: Diagnosis not present

## 2021-08-30 DIAGNOSIS — N401 Enlarged prostate with lower urinary tract symptoms: Secondary | ICD-10-CM | POA: Diagnosis not present

## 2021-08-30 DIAGNOSIS — E7800 Pure hypercholesterolemia, unspecified: Secondary | ICD-10-CM | POA: Diagnosis not present

## 2021-09-01 ENCOUNTER — Encounter: Payer: Self-pay | Admitting: Sports Medicine

## 2021-09-01 ENCOUNTER — Ambulatory Visit (INDEPENDENT_AMBULATORY_CARE_PROVIDER_SITE_OTHER): Payer: PPO | Admitting: Sports Medicine

## 2021-09-01 VITALS — BP 116/70 | HR 70 | Ht 72.0 in | Wt 189.0 lb

## 2021-09-01 DIAGNOSIS — G8929 Other chronic pain: Secondary | ICD-10-CM | POA: Diagnosis not present

## 2021-09-01 DIAGNOSIS — Z0000 Encounter for general adult medical examination without abnormal findings: Secondary | ICD-10-CM

## 2021-09-01 DIAGNOSIS — M25512 Pain in left shoulder: Secondary | ICD-10-CM | POA: Diagnosis not present

## 2021-09-01 DIAGNOSIS — M25511 Pain in right shoulder: Secondary | ICD-10-CM

## 2021-09-01 DIAGNOSIS — Z96651 Presence of right artificial knee joint: Secondary | ICD-10-CM

## 2021-09-01 DIAGNOSIS — M75100 Unspecified rotator cuff tear or rupture of unspecified shoulder, not specified as traumatic: Secondary | ICD-10-CM | POA: Insufficient documentation

## 2021-09-01 LAB — TSH: TSH: 1.86 mIU/L (ref 0.40–4.50)

## 2021-09-01 LAB — LIPID PANEL
Cholesterol: 180 mg/dL (ref <200)
HDL: 53 mg/dL (ref >=40)
LDL Cholesterol (Calc): 109 mg/dL (calc) — ABNORMAL HIGH
Non-HDL Cholesterol (Calc): 127 mg/dL (calc) (ref <130)
Total CHOL/HDL Ratio: 3.4 (calc) (ref <5.0)
Triglycerides: 85 mg/dL (ref <150)

## 2021-09-01 LAB — COMPLETE METABOLIC PANEL WITH GFR
AG Ratio: 2.3 (calc) (ref 1.0–2.5)
ALT: 24 U/L (ref 9–46)
AST: 28 U/L (ref 10–35)
Albumin: 4.5 g/dL (ref 3.6–5.1)
Alkaline phosphatase (APISO): 58 U/L (ref 35–144)
BUN: 23 mg/dL (ref 7–25)
CO2: 26 mmol/L (ref 20–32)
Calcium: 9.1 mg/dL (ref 8.6–10.3)
Chloride: 104 mmol/L (ref 98–110)
Creat: 1.18 mg/dL (ref 0.70–1.35)
Globulin: 2 g/dL (calc) (ref 1.9–3.7)
Glucose, Bld: 89 mg/dL (ref 65–99)
Potassium: 4.1 mmol/L (ref 3.5–5.3)
Sodium: 140 mmol/L (ref 135–146)
Total Bilirubin: 1.2 mg/dL (ref 0.2–1.2)
Total Protein: 6.5 g/dL (ref 6.1–8.1)
eGFR: 68 mL/min/{1.73_m2} (ref >=60)

## 2021-09-01 LAB — CBC
HCT: 50.4 % — ABNORMAL HIGH (ref 38.5–50.0)
Hemoglobin: 16.4 g/dL (ref 13.2–17.1)
MCH: 28.5 pg (ref 27.0–33.0)
MCHC: 32.5 g/dL (ref 32.0–36.0)
MCV: 87.5 fL (ref 80.0–100.0)
MPV: 11.1 fL (ref 7.5–12.5)
Platelets: 126 10*3/uL — ABNORMAL LOW (ref 140–400)
RBC: 5.76 10*6/uL (ref 4.20–5.80)
RDW: 13.9 % (ref 11.0–15.0)
WBC: 4.6 10*3/uL (ref 3.8–10.8)

## 2021-09-01 LAB — PSA, TOTAL AND FREE
PSA, % Free: 13 % (calc) — ABNORMAL LOW (ref >25)
PSA, Free: 0.2 ng/mL
PSA, Total: 1.5 ng/mL (ref <=4.0)

## 2021-09-01 MED ORDER — TRAMADOL HCL 50 MG PO TABS: 50.0000 mg | ORAL_TABLET | Freq: Three times a day (TID) | ORAL | 0 refills | Status: DC | PRN

## 2021-09-01 NOTE — Assessment & Plan Note (Signed)
Annual physical as above, up-to-date on screenings. He did have an episode of what sounded like vertigo a few months ago, went to the ED, was a bit anemic but this has since resolved. No chest pain, palpitations, nothing presyncopal. Negative bilateral Dix-Hallpike sign today. We discussed the anatomy and pathophysiology and he can do the Epley maneuver if this returns. Return in a year.

## 2021-09-01 NOTE — Progress Notes (Signed)
Subjective:    CC: Annual Physical Exam  HPI:  This patient is here for their annual physical  I reviewed the past medical history, family history, social history, surgical history, and allergies today and no changes were needed.  Please see the problem list section below in epic for further details.  Past Medical History: Past Medical History:  Diagnosis Date   Arthritis    R knee- , not symptomatic but reports DJD   Baker's cyst    right leg    Bladder infection, chronic    post op - knee replacement, has  had 3 rounds of antibiotics    BPH (benign prostatic hypertrophy)    Cancer (HCC)    basal cell carcinoma - abd   Chronic anticoagulation    Endocarditis    Family history of hiatal hernia    Headache    migraines   Heart murmur    Hemorrhoids    History of kidney stones 2018   2 stones- /w CAT scan, not yet passed    Hyperlipidemia    MVP (mitral valve prolapse)    Pneumonia 2017   post op after - R TKR   PONV (postoperative nausea and vomiting)    with dizziness    Proctitis    S/P MVR (mitral valve replacement)    Tinnitus    Urinary frequency    Weak urinary stream    Past Surgical History: Past Surgical History:  Procedure Laterality Date   APPENDECTOMY  1970   INGUINAL HERNIA REPAIR Right 10/26/2016   Procedure: RIGHT INGUINAL HERNIA REPAIR WITH MESH;  Surgeon: Jackolyn Confer, MD;  Location: [facility];  Service: General;  Laterality: Right;  GENERAL AND LMA    INSERTION OF MESH Right 10/26/2016   Procedure: INSERTION OF MESH;  Surgeon: Jackolyn Confer, MD;  Location: [place];  Service: General;  Laterality: Right;   KNEE SURGERY     MITRAL VALVE REPAIR     MITRAL VALVE REPLACEMENT     right knee arthroscopy     times 2   TONSILLECTOMY     TOTAL KNEE ARTHROPLASTY Right 11/07/2015   Procedure: RIGHT TOTAL KNEE ARTHROPLASTY;  Surgeon: Gaynelle Arabian, MD;  Location[facility];  Service: Orthopedics;  Laterality: Right;   Social History: Social History    Socioeconomic History   Marital status: Married    Spouse name: Not on file   Number of children: Not on file   Years of education: Not on file   Highest education level: Not on file  Occupational History   Not on file  Tobacco Use   Smoking status: Never   Smokeless tobacco: Never  Vaping Use   Vaping Use: Never used  Substance and Sexual Activity   Alcohol use: Yes    Comment: 1 drink 5 times per week    Drug use: No   Sexual activity: Yes  Other Topics Concern   Not on file  Social History Narrative   Not on file   Social Determinants of Health   Financial Resource Strain: Not on file  Food Insecurity: Not on file  Transportation Needs: Not on file  Physical Activity: Not on file  Stress: Not on file  Social Connections: Not on file   Family History: Family History  Problem Relation Age of Onset   Hypertension Mother    Leukemia Mother    Allergies: Allergies  Allergen Reactions   Benzonatate Other (See Comments)    Headaches    Levofloxacin  Other (See Comments)   Other Nausea And Vomiting    general anesthesia     Cephalosporins Hives and Itching   Medications: See med rec.  Review of Systems: No headache, visual changes, nausea, vomiting, diarrhea, constipation, dizziness, abdominal pain, skin rash, fevers, chills, night sweats, swollen lymph nodes, weight loss, chest pain, body aches, joint swelling, muscle aches, shortness of breath, mood changes, visual or auditory hallucinations.  Objective:    General: Well Developed, well nourished, and in no acute distress.  Neuro: Alert and oriented x3, extra-ocular muscles intact, sensation grossly intact. Cranial nerves II through XII are intact, motor, sensory, and coordinative functions are all intact. HEENT: Normocephalic, atraumatic, pupils equal round reactive to light, neck supple, no masses, no lymphadenopathy, thyroid nonpalpable. Oropharynx, nasopharynx, external ear canals are unremarkable. Skin:  Warm and dry, no rashes noted.  Cardiac: Regular rate and rhythm, no murmurs rubs or gallops.  Respiratory: Clear to auscultation bilaterally. Not using accessory muscles, speaking in full sentences.  Abdominal: Soft, nontender, nondistended, positive bowel sounds, no masses, no organomegaly.  Musculoskeletal: Shoulder, elbow, wrist, hip, knee, ankle stable, and with full range of motion.  Impression and Recommendations:    The patient was counselled, risk factors were discussed, anticipatory guidance given.  Annual physical exam Annual physical as above, up-to-date on screenings. He did have an episode of what sounded like vertigo a few months ago, went to the ED, was a bit anemic but this has since resolved. No chest pain, palpitations, nothing presyncopal. Negative bilateral Dix-Hallpike sign today. We discussed the anatomy and pathophysiology and he can do the Epley maneuver if this returns. Return in a year.  History of arthroplasty of right knee Doing well, was having a lot of problems with swelling, hemarthroses. We did an aspiration, we also did SynovaSure testing that was negative. Ultimately had arthroscopy with scar tissue debridement with Dr. Roxy Manns and has improved considerably. No further hemarthroses.  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 Last injection was decades ago. We will start him conservatively with home conditioning, tramadol at night as he does have significant nocturnal discomfort. If insufficient improvement after 6 weeks we will consider bilateral subacromial injections, we will likely get some imaging at that time as well. He was placed on Celebrex by his orthopedic surgeon, he does understand the risks of Celebrex and Coumadin treatment together. We did discuss signs to look out for.  ____________________________________________ Gwen Her. Dianah Field, M.D.,  ABFM., CAQSM., AME. Primary Care and Sports Medicine  [facility]  Adjunct Professor of [employer] [facility] of Medicine  Risk manager

## 2021-09-01 NOTE — Assessment & Plan Note (Signed)
Doing well, was having a lot of problems with swelling, hemarthroses. We did an aspiration, we also did SynovaSure testing that was negative. Ultimately had arthroscopy with scar tissue debridement with Dr. Roxy Manns and has improved considerably. No further hemarthroses.

## 2021-09-01 NOTE — Assessment & Plan Note (Signed)
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 Last injection was decades ago. We will start him conservatively with home conditioning, tramadol at night as he does have significant nocturnal discomfort. If insufficient improvement after 6 weeks we will consider bilateral subacromial injections, we will likely get some imaging at that time as well. He was placed on Celebrex by his orthopedic surgeon, he does understand the risks of Celebrex and Coumadin treatment together. We did discuss signs to look out for.

## 2021-09-10 ENCOUNTER — Encounter: Payer: Self-pay | Admitting: Sports Medicine

## 2021-09-11 DIAGNOSIS — Z952 Presence of prosthetic heart valve: Secondary | ICD-10-CM | POA: Diagnosis not present

## 2021-09-11 DIAGNOSIS — Z7901 Long term (current) use of anticoagulants: Secondary | ICD-10-CM | POA: Diagnosis not present

## 2021-09-27 ENCOUNTER — Encounter (INDEPENDENT_AMBULATORY_CARE_PROVIDER_SITE_OTHER): Payer: PPO | Admitting: Sports Medicine

## 2021-09-27 DIAGNOSIS — M25512 Pain in left shoulder: Secondary | ICD-10-CM

## 2021-09-27 DIAGNOSIS — G8929 Other chronic pain: Secondary | ICD-10-CM

## 2021-09-27 DIAGNOSIS — M25511 Pain in right shoulder: Secondary | ICD-10-CM

## 2021-09-28 MED ORDER — PREDNISONE 50 MG PO TABS: ORAL_TABLET | ORAL | 0 refills | Status: DC

## 2021-09-28 NOTE — Telephone Encounter (Signed)
I spent 5 total minutes of online digital evaluation and management services in this patient-initiated request for online care. 

## 2021-10-11 ENCOUNTER — Encounter: Payer: Self-pay | Admitting: General Practice

## 2021-10-16 DIAGNOSIS — Z952 Presence of prosthetic heart valve: Secondary | ICD-10-CM | POA: Diagnosis not present

## 2021-10-16 DIAGNOSIS — Z7901 Long term (current) use of anticoagulants: Secondary | ICD-10-CM | POA: Diagnosis not present

## 2021-10-20 DIAGNOSIS — I349 Nonrheumatic mitral valve disorder, unspecified: Secondary | ICD-10-CM | POA: Diagnosis not present

## 2021-10-20 DIAGNOSIS — E785 Hyperlipidemia, unspecified: Secondary | ICD-10-CM | POA: Diagnosis not present

## 2021-10-20 DIAGNOSIS — M1990 Unspecified osteoarthritis, unspecified site: Secondary | ICD-10-CM | POA: Diagnosis not present

## 2021-10-20 DIAGNOSIS — Z7901 Long term (current) use of anticoagulants: Secondary | ICD-10-CM | POA: Diagnosis not present

## 2021-10-20 DIAGNOSIS — I4891 Unspecified atrial fibrillation: Secondary | ICD-10-CM | POA: Diagnosis not present

## 2021-10-20 DIAGNOSIS — E663 Overweight: Secondary | ICD-10-CM | POA: Diagnosis not present

## 2021-10-20 DIAGNOSIS — G8929 Other chronic pain: Secondary | ICD-10-CM | POA: Diagnosis not present

## 2021-10-26 DIAGNOSIS — I4892 Unspecified atrial flutter: Secondary | ICD-10-CM | POA: Diagnosis not present

## 2021-10-30 ENCOUNTER — Encounter: Payer: Self-pay | Admitting: Sports Medicine

## 2021-11-03 ENCOUNTER — Encounter: Payer: Self-pay | Admitting: Sports Medicine

## 2021-11-07 DIAGNOSIS — I483 Typical atrial flutter: Secondary | ICD-10-CM | POA: Diagnosis not present

## 2021-11-09 ENCOUNTER — Encounter: Payer: Self-pay | Admitting: Sports Medicine

## 2021-11-10 DIAGNOSIS — Z952 Presence of prosthetic heart valve: Secondary | ICD-10-CM | POA: Diagnosis not present

## 2021-11-10 DIAGNOSIS — Z79899 Other long term (current) drug therapy: Secondary | ICD-10-CM | POA: Diagnosis not present

## 2021-11-10 DIAGNOSIS — R001 Bradycardia, unspecified: Secondary | ICD-10-CM | POA: Diagnosis not present

## 2021-11-10 DIAGNOSIS — I4892 Unspecified atrial flutter: Secondary | ICD-10-CM | POA: Diagnosis not present

## 2021-11-10 DIAGNOSIS — I483 Typical atrial flutter: Secondary | ICD-10-CM | POA: Diagnosis not present

## 2021-11-10 DIAGNOSIS — E785 Hyperlipidemia, unspecified: Secondary | ICD-10-CM | POA: Diagnosis not present

## 2021-11-10 DIAGNOSIS — Z7901 Long term (current) use of anticoagulants: Secondary | ICD-10-CM | POA: Diagnosis not present

## 2021-11-10 DIAGNOSIS — I4819 Other persistent atrial fibrillation: Secondary | ICD-10-CM | POA: Diagnosis not present

## 2021-11-11 DIAGNOSIS — I4892 Unspecified atrial flutter: Secondary | ICD-10-CM | POA: Diagnosis not present

## 2021-11-12 ENCOUNTER — Encounter: Payer: Self-pay | Admitting: Sports Medicine

## 2021-11-12 DIAGNOSIS — I483 Typical atrial flutter: Secondary | ICD-10-CM | POA: Diagnosis not present

## 2021-11-13 DIAGNOSIS — Z952 Presence of prosthetic heart valve: Secondary | ICD-10-CM | POA: Diagnosis not present

## 2021-11-13 DIAGNOSIS — Z7901 Long term (current) use of anticoagulants: Secondary | ICD-10-CM | POA: Diagnosis not present

## 2021-11-20 DIAGNOSIS — Z952 Presence of prosthetic heart valve: Secondary | ICD-10-CM | POA: Diagnosis not present

## 2021-12-20 DIAGNOSIS — Z7901 Long term (current) use of anticoagulants: Secondary | ICD-10-CM | POA: Diagnosis not present

## 2021-12-20 DIAGNOSIS — G478 Other sleep disorders: Secondary | ICD-10-CM | POA: Diagnosis not present

## 2021-12-20 DIAGNOSIS — R0683 Snoring: Secondary | ICD-10-CM | POA: Diagnosis not present

## 2021-12-20 DIAGNOSIS — I483 Typical atrial flutter: Secondary | ICD-10-CM | POA: Diagnosis not present

## 2021-12-29 ENCOUNTER — Ambulatory Visit (INDEPENDENT_AMBULATORY_CARE_PROVIDER_SITE_OTHER): Payer: PPO | Admitting: Sports Medicine

## 2021-12-29 ENCOUNTER — Ambulatory Visit (INDEPENDENT_AMBULATORY_CARE_PROVIDER_SITE_OTHER): Payer: PPO

## 2021-12-29 DIAGNOSIS — M25511 Pain in right shoulder: Secondary | ICD-10-CM | POA: Diagnosis not present

## 2021-12-29 DIAGNOSIS — M25512 Pain in left shoulder: Secondary | ICD-10-CM

## 2021-12-29 DIAGNOSIS — G8929 Other chronic pain: Secondary | ICD-10-CM | POA: Diagnosis not present

## 2021-12-29 MED ORDER — HYDROCODONE-ACETAMINOPHEN 5-325 MG PO TABS: 1.0000 | ORAL_TABLET | Freq: Three times a day (TID) | ORAL | 0 refills | Status: DC | PRN

## 2021-12-29 MED ORDER — TRIAMCINOLONE ACETONIDE 40 MG/ML IJ SUSP
80.0000 mg | Freq: Once | INTRAMUSCULAR | Status: AC
  Administered 2021-12-29: 80 mg via INTRAMUSCULAR

## 2021-12-29 NOTE — Assessment & Plan Note (Addendum)
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 Pain is worse over the deltoids, laterally and anteriorly worse with abduction. Last injection was decades ago, in July we treated him conservatively, unfortunately home conditioning, tramadol was not effective, so he is back. Today he has impingement signs on exam. We will start with bilateral subacromial injections, if insufficient improvement by 4 to 6 weeks we will proceed with bicep sheath injections as he did have a positive speeds and Yergason test as well. Still had some pain referable to the long head of the biceps after the injection. Adding hydrocodone for pain in the meantime.

## 2021-12-29 NOTE — Addendum Note (Signed)
Addended by: Silverio Decamp on: 12/29/2021 02:38 PM   Modules accepted: Orders

## 2021-12-29 NOTE — Progress Notes (Signed)
    Procedures performed today:    Procedure: Real-time Ultrasound Guided injection of the right subacromial bursa Device: Samsung HS60  Verbal informed consent obtained.  Time-out conducted.  Noted no overlying erythema, induration, or other signs of local infection.  Skin prepped in a sterile fashion.  Local anesthesia: Topical Ethyl chloride.  With sterile technique and under real time ultrasound guidance: Noted partial thickness articular sided cuff tearing, 1 cc lidocaine, 1 cc bupivacaine, 1 cc kenalog 40 injected easily. Completed without difficulty  Advised to call if fevers/chills, erythema, induration, drainage, or persistent bleeding.  Images permanently stored and available for review in PACS.  Impression: Technically successful ultrasound guided injection.  Procedure: Real-time Ultrasound Guided injection of the left subacromial bursa Device: Samsung HS60  Verbal informed consent obtained.  Time-out conducted.  Noted no overlying erythema, induration, or other signs of local infection.  Skin prepped in a sterile fashion.  Local anesthesia: Topical Ethyl chloride.  With sterile technique and under real time ultrasound guidance: Noted articular sided cuff tearing, 1 cc lidocaine, 1 cc bupivacaine, 1 cc kenalog 40 injected easily. Completed without difficulty  Advised to call if fevers/chills, erythema, induration, drainage, or persistent bleeding.  Images permanently stored and available for review in PACS.  Impression: Technically successful ultrasound guided injection.  Independent interpretation of notes and tests performed by another provider:   None.  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 Pain is worse over the deltoids, laterally and anteriorly worse with abduction. Last injection was  decades ago, in July we treated him conservatively, unfortunately home conditioning, tramadol was not effective, so he is back. Today he has impingement signs on exam. We will start with bilateral subacromial injections, if insufficient improvement by 4 to 6 weeks we will proceed with bicep sheath injections as he did have a positive speeds and Yergason test as well. Still had some pain referable to the long head of the biceps after the injection. Adding hydrocodone for pain in the meantime.    ____________________________________________ Zachary Calderon. Dianah Field, M.D., ABFM., CAQSM., AME. Primary Care and Sports Medicine [facility]  Adjunct Professor of [employer] [facility] of Medicine  Risk manager

## 2021-12-29 NOTE — Addendum Note (Signed)
Addended by: Tarri Glenn A on: 12/29/2021 03:59 PM   Modules accepted: Orders

## 2022-01-01 DIAGNOSIS — Z7901 Long term (current) use of anticoagulants: Secondary | ICD-10-CM | POA: Diagnosis not present

## 2022-01-01 DIAGNOSIS — Z952 Presence of prosthetic heart valve: Secondary | ICD-10-CM | POA: Diagnosis not present

## 2022-01-05 DIAGNOSIS — Z881 Allergy status to other antibiotic agents status: Secondary | ICD-10-CM | POA: Diagnosis not present

## 2022-01-05 DIAGNOSIS — Z888 Allergy status to other drugs, medicaments and biological substances status: Secondary | ICD-10-CM | POA: Diagnosis not present

## 2022-01-05 DIAGNOSIS — Z952 Presence of prosthetic heart valve: Secondary | ICD-10-CM | POA: Diagnosis not present

## 2022-01-05 DIAGNOSIS — Z79899 Other long term (current) drug therapy: Secondary | ICD-10-CM | POA: Diagnosis not present

## 2022-01-05 DIAGNOSIS — I483 Typical atrial flutter: Secondary | ICD-10-CM | POA: Diagnosis not present

## 2022-01-05 DIAGNOSIS — Z7901 Long term (current) use of anticoagulants: Secondary | ICD-10-CM | POA: Diagnosis not present

## 2022-01-08 DIAGNOSIS — I4819 Other persistent atrial fibrillation: Secondary | ICD-10-CM | POA: Diagnosis not present

## 2022-01-22 DIAGNOSIS — G478 Other sleep disorders: Secondary | ICD-10-CM | POA: Diagnosis not present

## 2022-01-22 DIAGNOSIS — R0683 Snoring: Secondary | ICD-10-CM | POA: Diagnosis not present

## 2022-01-22 DIAGNOSIS — I4892 Unspecified atrial flutter: Secondary | ICD-10-CM | POA: Diagnosis not present

## 2022-01-25 DIAGNOSIS — R0683 Snoring: Secondary | ICD-10-CM | POA: Diagnosis not present

## 2022-01-25 DIAGNOSIS — I4892 Unspecified atrial flutter: Secondary | ICD-10-CM | POA: Diagnosis not present

## 2022-01-25 DIAGNOSIS — G478 Other sleep disorders: Secondary | ICD-10-CM | POA: Diagnosis not present

## 2022-01-26 ENCOUNTER — Ambulatory Visit: Payer: PPO | Admitting: Sports Medicine

## 2022-02-02 ENCOUNTER — Ambulatory Visit (INDEPENDENT_AMBULATORY_CARE_PROVIDER_SITE_OTHER): Payer: PPO | Admitting: Sports Medicine

## 2022-02-02 DIAGNOSIS — M25511 Pain in right shoulder: Secondary | ICD-10-CM

## 2022-02-02 DIAGNOSIS — M25512 Pain in left shoulder: Secondary | ICD-10-CM | POA: Diagnosis not present

## 2022-02-02 DIAGNOSIS — G8929 Other chronic pain: Secondary | ICD-10-CM | POA: Diagnosis not present

## 2022-02-02 NOTE — Assessment & Plan Note (Signed)
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 Unfortunately while doing a lumbar manipulation at his office he developed increasing pain right shoulder. Today's pain is predominantly impingement/supraspinatus. He will try to use more of his lower body with manipulation in the office, and we will watch this for another 6 weeks before considering repeat subacromial injection on the right.

## 2022-02-02 NOTE — Progress Notes (Signed)
    Procedures performed today:    None.  Independent interpretation of notes and tests performed by another provider:   None.  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 Unfortunately while doing a lumbar manipulation at his office he developed increasing pain right shoulder. Today's pain is predominantly impingement/supraspinatus. He will try to use more of his lower body with manipulation in the office, and we will watch this for another 6 weeks before considering repeat subacromial injection on the right.    ____________________________________________ Gwen Her. Dianah Field, M.D., ABFM., CAQSM., AME. Primary Care and Sports Medicine [place] [facility]  Adjunct Professor of [employer] [employer] [facility] of Medicine  Risk manager

## 2022-02-26 DIAGNOSIS — Z7901 Long term (current) use of anticoagulants: Secondary | ICD-10-CM | POA: Diagnosis not present

## 2022-02-26 DIAGNOSIS — Z952 Presence of prosthetic heart valve: Secondary | ICD-10-CM | POA: Diagnosis not present

## 2022-02-28 ENCOUNTER — Encounter: Payer: Self-pay | Admitting: Sports Medicine

## 2022-03-02 DIAGNOSIS — I483 Typical atrial flutter: Secondary | ICD-10-CM | POA: Diagnosis not present

## 2022-03-02 DIAGNOSIS — Z7901 Long term (current) use of anticoagulants: Secondary | ICD-10-CM | POA: Diagnosis not present

## 2022-03-02 DIAGNOSIS — I440 Atrioventricular block, first degree: Secondary | ICD-10-CM | POA: Diagnosis not present

## 2022-03-03 ENCOUNTER — Other Ambulatory Visit (HOSPITAL_COMMUNITY): Payer: Self-pay

## 2022-03-03 MED ORDER — COLCHICINE 0.6 MG PO CAPS
1.0000 | ORAL_CAPSULE | Freq: Two times a day (BID) | ORAL | 0 refills | Status: DC | PRN
  Filled 2022-03-03: qty 6, 3d supply, fill #0

## 2022-03-03 MED ORDER — OMEPRAZOLE 40 MG PO CPDR
40.0000 mg | DELAYED_RELEASE_CAPSULE | Freq: Two times a day (BID) | ORAL | 0 refills | Status: DC
  Filled 2022-03-03: qty 60, 30d supply, fill #0

## 2022-03-04 DIAGNOSIS — I440 Atrioventricular block, first degree: Secondary | ICD-10-CM | POA: Diagnosis not present

## 2022-03-05 ENCOUNTER — Other Ambulatory Visit: Payer: Self-pay

## 2022-03-05 ENCOUNTER — Other Ambulatory Visit (HOSPITAL_COMMUNITY): Payer: Self-pay

## 2022-03-05 ENCOUNTER — Other Ambulatory Visit: Payer: Self-pay | Admitting: Sports Medicine

## 2022-03-05 DIAGNOSIS — G8929 Other chronic pain: Secondary | ICD-10-CM

## 2022-03-05 MED ORDER — HYDROCODONE-ACETAMINOPHEN 5-325 MG PO TABS: 1.0000 | ORAL_TABLET | Freq: Three times a day (TID) | ORAL | 0 refills | Status: DC | PRN

## 2022-03-05 NOTE — Telephone Encounter (Signed)
Last OV: 02/02/22 Next OV: 03/16/22 Last RF: 12/29/21

## 2022-03-16 ENCOUNTER — Ambulatory Visit (INDEPENDENT_AMBULATORY_CARE_PROVIDER_SITE_OTHER): Payer: PPO | Admitting: Sports Medicine

## 2022-03-16 ENCOUNTER — Ambulatory Visit (INDEPENDENT_AMBULATORY_CARE_PROVIDER_SITE_OTHER): Payer: PPO

## 2022-03-16 DIAGNOSIS — G8929 Other chronic pain: Secondary | ICD-10-CM | POA: Diagnosis not present

## 2022-03-16 DIAGNOSIS — M25511 Pain in right shoulder: Secondary | ICD-10-CM

## 2022-03-16 DIAGNOSIS — M25512 Pain in left shoulder: Secondary | ICD-10-CM

## 2022-03-16 DIAGNOSIS — M19011 Primary osteoarthritis, right shoulder: Secondary | ICD-10-CM | POA: Diagnosis not present

## 2022-03-16 MED ORDER — TRIAMCINOLONE ACETONIDE 40 MG/ML IJ SUSP
40.0000 mg | Freq: Once | INTRAMUSCULAR | Status: AC
  Administered 2022-03-16: 40 mg via INTRAMUSCULAR

## 2022-03-16 NOTE — Assessment & Plan Note (Addendum)
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 He had bilateral subacromial injections approximately 2 and half months ago. He had fantastic relief until doing some manipulation on a patient. Now has increasing pain. He has impingement signs and bicipital signs today. Today we did repeat bilateral subacromial injections and bilateral bicipital sheath injections. He has had at least 6 weeks of physician directed physical therapy, has a positive Neer's, Hawkins, empty can signs, weakness to abduction, as we may be referring him to Dr. Griffin Basil in the near future I would like bilateral shoulder x-rays and bilateral shoulder MRIs. Return to see me in about 6 weeks.

## 2022-03-16 NOTE — Progress Notes (Signed)
Procedures performed today:    Procedure: Real-time Ultrasound Guided injection of the left subacromial bursa Device: Samsung HS60  Verbal informed consent obtained.  Time-out conducted.  Noted no overlying erythema, induration, or other signs of local infection.  Skin prepped in a sterile fashion.  Local anesthesia: Topical Ethyl chloride.  With sterile technique and under real time ultrasound guidance: Noted intact rotator cuff, 1 cc Kenalog 40, 1 cc lidocaine, 1 cc bupivacaine injected easily Completed without difficulty  Advised to call if fevers/chills, erythema, induration, drainage, or persistent bleeding.  Images permanently stored and available for review in PACS.  Impression: Technically successful ultrasound guided injection.  Procedure: Real-time Ultrasound Guided injection of the right subacromial bursa Device: Samsung HS60  Verbal informed consent obtained.  Time-out conducted.  Noted no overlying erythema, induration, or other signs of local infection.  Skin prepped in a sterile fashion.  Local anesthesia: Topical Ethyl chloride.  With sterile technique and under real time ultrasound guidance: Noted heterogenous rotator cuff, 1 cc Kenalog 40, 1 cc lidocaine, 1 cc bupivacaine injected easily Completed without difficulty  Advised to call if fevers/chills, erythema, induration, drainage, or persistent bleeding.  Images permanently stored and available for review in PACS.  Impression: Technically successful ultrasound guided injection.   Procedure: Real-time Ultrasound Guided injection of the right proximal bicipital Device: Samsung HS60  Verbal informed consent obtained.  Time-out conducted.  Noted no overlying erythema, induration, or other signs of local infection.  Skin prepped in a sterile fashion.  Local anesthesia: Topical Ethyl chloride.  With sterile technique and under real time ultrasound guidance: Noted minimal bicep sheath synovitis, 1 cc Kenalog 40,  1 cc lidocaine, 1 cc bupivacaine injected easily Completed without difficulty  Advised to call if fevers/chills, erythema, induration, drainage, or persistent bleeding.  Images permanently stored and available for review in PACS.  Impression: Technically successful ultrasound guided injection.  Procedure: Real-time Ultrasound Guided injection of the left proximal bicipital Device: Samsung HS60  Verbal informed consent obtained.  Time-out conducted.  Noted no overlying erythema, induration, or other signs of local infection.  Skin prepped in a sterile fashion.  Local anesthesia: Topical Ethyl chloride.  With sterile technique and under real time ultrasound guidance: Noted minimal bicep sheath synovitis, 1 cc Kenalog 40, 1 cc lidocaine, 1 cc bupivacaine injected easily Completed without difficulty  Advised to call if fevers/chills, erythema, induration, drainage, or persistent bleeding.  Images permanently stored and available for review in PACS.  Impression: Technically successful ultrasound guided injection.  Independent interpretation of notes and tests performed by another provider:   None.  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 He had bilateral subacromial injections approximately 2 and half months ago. He had fantastic relief until doing some manipulation on a patient. Now has increasing pain. Today we did repeat bilateral subacromial injections. He has had at least 6 weeks of physician directed physical therapy, has a positive Neer's, Hawkins, empty can signs, weakness to abduction, as we may be referring him to Dr. Griffin Basil in the near future I would like bilateral shoulder x-rays and bilateral shoulder MRIs. Return to see me in about 6 weeks.    ____________________________________________ Gwen Her.  Dianah Field, M.D., ABFM., CAQSM., AME. Primary Care and Sports Medicine  [facility]  Adjunct Professor of [place] [employer] [facility] of Medicine  Designer, industrial/product  Medical Examiner

## 2022-03-24 ENCOUNTER — Other Ambulatory Visit: Payer: PPO

## 2022-03-31 ENCOUNTER — Ambulatory Visit (INDEPENDENT_AMBULATORY_CARE_PROVIDER_SITE_OTHER): Payer: PPO

## 2022-03-31 DIAGNOSIS — M75122 Complete rotator cuff tear or rupture of left shoulder, not specified as traumatic: Secondary | ICD-10-CM | POA: Diagnosis not present

## 2022-03-31 DIAGNOSIS — M25512 Pain in left shoulder: Secondary | ICD-10-CM

## 2022-03-31 DIAGNOSIS — G8929 Other chronic pain: Secondary | ICD-10-CM

## 2022-03-31 DIAGNOSIS — M25511 Pain in right shoulder: Secondary | ICD-10-CM

## 2022-04-01 ENCOUNTER — Encounter: Payer: Self-pay | Admitting: Sports Medicine

## 2022-04-01 DIAGNOSIS — M75120 Complete rotator cuff tear or rupture of unspecified shoulder, not specified as traumatic: Secondary | ICD-10-CM

## 2022-04-03 ENCOUNTER — Encounter: Payer: Self-pay | Admitting: Sports Medicine

## 2022-04-09 ENCOUNTER — Encounter: Payer: Self-pay | Admitting: Sports Medicine

## 2022-04-09 DIAGNOSIS — Z7901 Long term (current) use of anticoagulants: Secondary | ICD-10-CM | POA: Diagnosis not present

## 2022-04-09 DIAGNOSIS — Z952 Presence of prosthetic heart valve: Secondary | ICD-10-CM | POA: Diagnosis not present

## 2022-04-11 ENCOUNTER — Encounter: Payer: Self-pay | Admitting: Sports Medicine

## 2022-04-12 ENCOUNTER — Other Ambulatory Visit (HOSPITAL_COMMUNITY): Payer: Self-pay

## 2022-04-12 ENCOUNTER — Other Ambulatory Visit: Payer: Self-pay | Admitting: Sports Medicine

## 2022-04-12 DIAGNOSIS — M25511 Pain in right shoulder: Secondary | ICD-10-CM | POA: Diagnosis not present

## 2022-04-12 DIAGNOSIS — M25512 Pain in left shoulder: Secondary | ICD-10-CM | POA: Diagnosis not present

## 2022-04-12 MED ORDER — WARFARIN SODIUM 5 MG PO TABS
5.0000 mg | ORAL_TABLET | Freq: Every day | ORAL | 1 refills | Status: DC
  Filled 2022-04-12 – 2022-04-24 (×2): qty 90, 90d supply, fill #0
  Filled 2022-07-19: qty 90, 90d supply, fill #1

## 2022-04-17 ENCOUNTER — Other Ambulatory Visit: Payer: Self-pay | Admitting: Sports Medicine

## 2022-04-17 ENCOUNTER — Ambulatory Visit: Payer: PPO | Admitting: Sports Medicine

## 2022-04-17 DIAGNOSIS — Z952 Presence of prosthetic heart valve: Secondary | ICD-10-CM | POA: Diagnosis not present

## 2022-04-17 DIAGNOSIS — Z9889 Other specified postprocedural states: Secondary | ICD-10-CM | POA: Diagnosis not present

## 2022-04-17 DIAGNOSIS — Z8679 Personal history of other diseases of the circulatory system: Secondary | ICD-10-CM | POA: Diagnosis not present

## 2022-04-17 DIAGNOSIS — Z7901 Long term (current) use of anticoagulants: Secondary | ICD-10-CM | POA: Diagnosis not present

## 2022-04-17 DIAGNOSIS — I483 Typical atrial flutter: Secondary | ICD-10-CM | POA: Diagnosis not present

## 2022-04-19 ENCOUNTER — Ambulatory Visit (INDEPENDENT_AMBULATORY_CARE_PROVIDER_SITE_OTHER): Payer: PPO | Admitting: Sports Medicine

## 2022-04-19 DIAGNOSIS — L989 Disorder of the skin and subcutaneous tissue, unspecified: Secondary | ICD-10-CM | POA: Diagnosis not present

## 2022-04-19 NOTE — Progress Notes (Signed)
    Procedures performed today:    None.  Independent interpretation of notes and tests performed by another provider:   None.  Brief History, Exam, Impression, and Recommendations:    Skin lesion of right arm 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  Between making the appointment and today he had a accidental rupture of the long head of the biceps tendon. His upper arm is quite bruised unable to see the skin lesion. We will of course need to wait until the bruising resolves and maybe a month before I can consider a biopsy as I cannot see the borders of the skin lesion now.    ____________________________________________ Gwen Her. Dianah Field, M.D., ABFM., CAQSM., AME. Primary Care and Sports Medicine [place] [facility]  Adjunct Professor of [place] [employer] [facility] of Medicine  Risk manager

## 2022-04-19 NOTE — Assessment & Plan Note (Signed)
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  Between making the appointment and today he had a accidental rupture of the long head of the biceps tendon. His upper arm is quite bruised unable to see the skin lesion. We will of course need to wait until the bruising resolves and maybe a month before I can consider a biopsy as I cannot see the borders of the skin lesion now.

## 2022-04-20 ENCOUNTER — Other Ambulatory Visit (HOSPITAL_COMMUNITY): Payer: Self-pay

## 2022-04-20 MED ORDER — ATORVASTATIN CALCIUM 20 MG PO TABS
20.0000 mg | ORAL_TABLET | Freq: Every day | ORAL | 0 refills | Status: DC
  Filled 2022-04-20 – 2022-04-24 (×2): qty 90, 90d supply, fill #0

## 2022-04-21 ENCOUNTER — Other Ambulatory Visit (HOSPITAL_COMMUNITY): Payer: Self-pay

## 2022-04-24 ENCOUNTER — Other Ambulatory Visit (HOSPITAL_COMMUNITY): Payer: Self-pay

## 2022-04-25 ENCOUNTER — Other Ambulatory Visit (HOSPITAL_COMMUNITY): Payer: Self-pay

## 2022-04-27 ENCOUNTER — Ambulatory Visit: Payer: PPO | Admitting: Sports Medicine

## 2022-04-30 ENCOUNTER — Other Ambulatory Visit: Payer: Self-pay | Admitting: Sports Medicine

## 2022-05-01 NOTE — Telephone Encounter (Signed)
Rx written by historical provider. 

## 2022-05-11 ENCOUNTER — Encounter: Payer: Self-pay | Admitting: Sports Medicine

## 2022-05-16 DIAGNOSIS — M75121 Complete rotator cuff tear or rupture of right shoulder, not specified as traumatic: Secondary | ICD-10-CM | POA: Diagnosis not present

## 2022-05-16 DIAGNOSIS — M75122 Complete rotator cuff tear or rupture of left shoulder, not specified as traumatic: Secondary | ICD-10-CM | POA: Diagnosis not present

## 2022-05-21 DIAGNOSIS — Z7901 Long term (current) use of anticoagulants: Secondary | ICD-10-CM | POA: Diagnosis not present

## 2022-05-21 DIAGNOSIS — Z952 Presence of prosthetic heart valve: Secondary | ICD-10-CM | POA: Diagnosis not present

## 2022-05-23 ENCOUNTER — Encounter: Payer: Self-pay | Admitting: Sports Medicine

## 2022-05-25 ENCOUNTER — Ambulatory Visit (INDEPENDENT_AMBULATORY_CARE_PROVIDER_SITE_OTHER): Payer: PPO | Admitting: Sports Medicine

## 2022-05-25 ENCOUNTER — Encounter: Payer: Self-pay | Admitting: Sports Medicine

## 2022-05-25 ENCOUNTER — Other Ambulatory Visit (INDEPENDENT_AMBULATORY_CARE_PROVIDER_SITE_OTHER): Payer: PPO

## 2022-05-25 VITALS — BP 137/82 | HR 76

## 2022-05-25 DIAGNOSIS — L989 Disorder of the skin and subcutaneous tissue, unspecified: Secondary | ICD-10-CM

## 2022-05-25 DIAGNOSIS — S46012S Strain of muscle(s) and tendon(s) of the rotator cuff of left shoulder, sequela: Secondary | ICD-10-CM | POA: Diagnosis not present

## 2022-05-25 NOTE — Assessment & Plan Note (Signed)
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 He did talk to Dr. Everardo Pacific, he is likely going to be looking at operative intervention of the left shoulder, on his right shoulder he had a long head of the biceps rupture and a lot of his pain went away. Due to persistent left shoulder pain we did a left subacromial injection, no further injections for several months, he is a candidate for operative intervention with Dr. Ave Filter or Dr. Everardo Pacific.

## 2022-05-25 NOTE — Assessment & Plan Note (Signed)
Bruising improved, skin lesion does appear to be a seborrheic keratosis, no further intervention needed.

## 2022-05-25 NOTE — Progress Notes (Signed)
    Procedures performed today:    Procedure: Real-time Ultrasound Guided injection of the left subacromial bursa Device: Samsung HS60  Verbal informed consent obtained.  Time-out conducted.  Noted no overlying erythema, induration, or other signs of local infection.  Skin prepped in a sterile fashion.  Local anesthesia: Topical Ethyl chloride.  With sterile technique and under real time ultrasound guidance: Noted routine cuff tearing, 1 cc Kenalog 40, 1 cc lidocaine, 1 cc bupivacaine injected easily Completed without difficulty  Advised to call if fevers/chills, erythema, induration, drainage, or persistent bleeding.  Images permanently stored and available for review in PACS.  Impression: Technically successful ultrasound guided injection.  Independent interpretation of notes and tests performed by another provider:   None.  Brief History, Exam, Impression, and Recommendations:    Skin lesion of right arm Bruising improved, skin lesion does appear to be a seborrheic keratosis, no further intervention needed.  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 He did talk to Dr. Everardo Pacific, he is likely going to be looking at operative intervention of the left shoulder, on his right shoulder he had a long head of the biceps rupture and a lot of his pain went away. Due to persistent left shoulder pain we did a left subacromial injection, no further injections for several months, he is a candidate for operative intervention with Dr. Ave Filter or Dr. Everardo Pacific.    ____________________________________________ Ihor Austin. Benjamin Stain, M.D., ABFM., CAQSM., AME. Primary Care and Sports Medicine [place] [facility]  Adjunct Professor of Family Medicine  [place] [employer] [facility] of Medicine  Stage manager

## 2022-05-26 ENCOUNTER — Other Ambulatory Visit: Payer: Self-pay | Admitting: Sports Medicine

## 2022-05-26 ENCOUNTER — Other Ambulatory Visit (HOSPITAL_COMMUNITY): Payer: Self-pay

## 2022-05-26 DIAGNOSIS — G8929 Other chronic pain: Secondary | ICD-10-CM

## 2022-05-28 ENCOUNTER — Other Ambulatory Visit (HOSPITAL_COMMUNITY): Payer: Self-pay

## 2022-05-28 MED ORDER — HYDROCODONE-ACETAMINOPHEN 5-325 MG PO TABS
1.0000 | ORAL_TABLET | Freq: Three times a day (TID) | ORAL | 0 refills | Status: DC | PRN
  Filled 2022-05-28: qty 30, 10d supply, fill #0

## 2022-05-28 NOTE — Telephone Encounter (Signed)
Last OV: 05/25/22 Next OV: 08/31/22 Last RF: 03/05/22

## 2022-05-31 ENCOUNTER — Telehealth: Payer: Self-pay | Admitting: Sports Medicine

## 2022-05-31 NOTE — Telephone Encounter (Signed)
Called patient to schedule Medicare Annual Wellness Visit (AWV). Left message for patient to call back and schedule Medicare Annual Wellness Visit (AWV).  Last date of AWV: Never  Please schedule an appointment at any time with [facility].  If any questions, please contact me at [phone].  Thank you ,  Morgan Jessup Patient Access Advocate II Direct Dial: [phone]  

## 2022-06-02 ENCOUNTER — Other Ambulatory Visit (HOSPITAL_COMMUNITY): Payer: Self-pay

## 2022-07-10 ENCOUNTER — Encounter: Payer: Self-pay | Admitting: Sports Medicine

## 2022-07-17 DIAGNOSIS — Z7901 Long term (current) use of anticoagulants: Secondary | ICD-10-CM | POA: Diagnosis not present

## 2022-07-17 DIAGNOSIS — Z952 Presence of prosthetic heart valve: Secondary | ICD-10-CM | POA: Diagnosis not present

## 2022-07-19 ENCOUNTER — Other Ambulatory Visit (HOSPITAL_COMMUNITY): Payer: Self-pay

## 2022-07-19 ENCOUNTER — Other Ambulatory Visit: Payer: Self-pay

## 2022-07-19 ENCOUNTER — Other Ambulatory Visit: Payer: Self-pay | Admitting: Sports Medicine

## 2022-07-19 DIAGNOSIS — G8929 Other chronic pain: Secondary | ICD-10-CM

## 2022-07-19 MED ORDER — HYDROCODONE-ACETAMINOPHEN 5-325 MG PO TABS
1.0000 | ORAL_TABLET | Freq: Three times a day (TID) | ORAL | 0 refills | Status: DC | PRN
  Filled 2022-07-19: qty 30, 10d supply, fill #0

## 2022-07-20 ENCOUNTER — Other Ambulatory Visit (HOSPITAL_COMMUNITY): Payer: Self-pay

## 2022-07-21 ENCOUNTER — Other Ambulatory Visit (HOSPITAL_COMMUNITY): Payer: Self-pay

## 2022-07-23 ENCOUNTER — Other Ambulatory Visit (HOSPITAL_COMMUNITY): Payer: Self-pay

## 2022-07-23 ENCOUNTER — Encounter: Payer: Self-pay | Admitting: Sports Medicine

## 2022-07-23 MED ORDER — ATORVASTATIN CALCIUM 20 MG PO TABS
20.0000 mg | ORAL_TABLET | Freq: Every day | ORAL | 0 refills | Status: DC
  Filled 2022-07-23 – 2022-08-03 (×2): qty 90, 90d supply, fill #0

## 2022-07-31 ENCOUNTER — Other Ambulatory Visit (HOSPITAL_COMMUNITY): Payer: Self-pay

## 2022-08-02 ENCOUNTER — Encounter: Payer: Self-pay | Admitting: Sports Medicine

## 2022-08-03 ENCOUNTER — Other Ambulatory Visit (HOSPITAL_COMMUNITY): Payer: Self-pay

## 2022-08-15 ENCOUNTER — Encounter (INDEPENDENT_AMBULATORY_CARE_PROVIDER_SITE_OTHER): Payer: PPO | Admitting: Sports Medicine

## 2022-08-15 DIAGNOSIS — E7800 Pure hypercholesterolemia, unspecified: Secondary | ICD-10-CM | POA: Diagnosis not present

## 2022-08-15 DIAGNOSIS — M75120 Complete rotator cuff tear or rupture of unspecified shoulder, not specified as traumatic: Secondary | ICD-10-CM

## 2022-08-15 DIAGNOSIS — N401 Enlarged prostate with lower urinary tract symptoms: Secondary | ICD-10-CM

## 2022-08-15 NOTE — Telephone Encounter (Signed)
I spent 5 total minutes of online digital evaluation and management services in this patient-initiated request for online care. 

## 2022-08-20 DIAGNOSIS — Z952 Presence of prosthetic heart valve: Secondary | ICD-10-CM | POA: Diagnosis not present

## 2022-08-20 DIAGNOSIS — Z7901 Long term (current) use of anticoagulants: Secondary | ICD-10-CM | POA: Diagnosis not present

## 2022-08-29 DIAGNOSIS — N401 Enlarged prostate with lower urinary tract symptoms: Secondary | ICD-10-CM | POA: Diagnosis not present

## 2022-08-29 DIAGNOSIS — R7309 Other abnormal glucose: Secondary | ICD-10-CM | POA: Diagnosis not present

## 2022-08-29 DIAGNOSIS — R350 Frequency of micturition: Secondary | ICD-10-CM | POA: Diagnosis not present

## 2022-08-29 DIAGNOSIS — E7800 Pure hypercholesterolemia, unspecified: Secondary | ICD-10-CM | POA: Diagnosis not present

## 2022-08-31 ENCOUNTER — Other Ambulatory Visit (INDEPENDENT_AMBULATORY_CARE_PROVIDER_SITE_OTHER): Payer: PPO

## 2022-08-31 ENCOUNTER — Ambulatory Visit (INDEPENDENT_AMBULATORY_CARE_PROVIDER_SITE_OTHER): Payer: PPO | Admitting: Sports Medicine

## 2022-08-31 ENCOUNTER — Other Ambulatory Visit (HOSPITAL_COMMUNITY): Payer: Self-pay

## 2022-08-31 ENCOUNTER — Encounter: Payer: Self-pay | Admitting: Sports Medicine

## 2022-08-31 VITALS — BP 151/90 | HR 69 | Ht 72.0 in | Wt 187.0 lb

## 2022-08-31 DIAGNOSIS — M75120 Complete rotator cuff tear or rupture of unspecified shoulder, not specified as traumatic: Secondary | ICD-10-CM

## 2022-08-31 DIAGNOSIS — M25512 Pain in left shoulder: Secondary | ICD-10-CM | POA: Diagnosis not present

## 2022-08-31 DIAGNOSIS — Z0001 Encounter for general adult medical examination with abnormal findings: Secondary | ICD-10-CM

## 2022-08-31 DIAGNOSIS — Z0000 Encounter for general adult medical examination without abnormal findings: Secondary | ICD-10-CM

## 2022-08-31 DIAGNOSIS — G8929 Other chronic pain: Secondary | ICD-10-CM | POA: Diagnosis not present

## 2022-08-31 DIAGNOSIS — M25511 Pain in right shoulder: Secondary | ICD-10-CM

## 2022-08-31 DIAGNOSIS — E7800 Pure hypercholesterolemia, unspecified: Secondary | ICD-10-CM

## 2022-08-31 DIAGNOSIS — Z1211 Encounter for screening for malignant neoplasm of colon: Secondary | ICD-10-CM

## 2022-08-31 MED ORDER — ZOLPIDEM TARTRATE 5 MG PO TABS
5.0000 mg | ORAL_TABLET | Freq: Every day | ORAL | 0 refills | Status: DC
Start: 2022-08-31 — End: 2022-10-21
  Filled 2022-08-31: qty 21, 21d supply, fill #0

## 2022-08-31 MED ORDER — HYDROCODONE-ACETAMINOPHEN 5-325 MG PO TABS
1.0000 | ORAL_TABLET | Freq: Three times a day (TID) | ORAL | 0 refills | Status: DC | PRN
  Filled 2022-08-31: qty 30, 10d supply, fill #0

## 2022-08-31 NOTE — Progress Notes (Addendum)
Subjective:    CC: Annual Physical Exam  HPI:  This patient is here for their annual physical  I reviewed the past medical history, family history, social history, surgical history, and allergies today and no changes were needed.  Please see the problem list section below in epic for further details.  Past Medical History: Past Medical History:  Diagnosis Date   Arthritis    R knee- , not symptomatic but reports DJD   Baker's cyst    right leg    Bladder infection, chronic    post op - knee replacement, has  had 3 rounds of antibiotics    BPH (benign prostatic hypertrophy)    Cancer (HCC)    basal cell carcinoma - abd   Chronic anticoagulation    Endocarditis    Family history of hiatal hernia    Headache    migraines   Heart murmur    Hemorrhoids    History of kidney stones 2018   2 stones- /w CAT scan, not yet passed    Hyperlipidemia    MVP (mitral valve prolapse)    Pneumonia 2017   post op after - R TKR   PONV (postoperative nausea and vomiting)    with dizziness    Proctitis    S/P MVR (mitral valve replacement)    Tinnitus    Urinary frequency    Weak urinary stream    Past Surgical History: Past Surgical History:  Procedure Laterality Date   APPENDECTOMY  1970   INGUINAL HERNIA REPAIR Right 10/26/2016   Procedure: RIGHT INGUINAL HERNIA REPAIR WITH MESH;  Surgeon: Avel Peace, MD;  Location: [facility] OR;  Service: General;  Laterality: Right;  GENERAL AND LMA    INSERTION OF MESH Right 10/26/2016   Procedure: INSERTION OF MESH;  Surgeon: Avel Peace, MD;  Location: [facility] OR;  Service: General;  Laterality: Right;   KNEE SURGERY     MITRAL VALVE REPAIR     MITRAL VALVE REPLACEMENT     ST Jude [id number]   right knee arthroscopy     times 2   TONSILLECTOMY     TOTAL KNEE ARTHROPLASTY Right 11/07/2015   Procedure: RIGHT TOTAL KNEE ARTHROPLASTY;  Surgeon: Ollen Gross, MD;  Location: [facility] ORS;  Service: Orthopedics;  Laterality: Right;   Social  History: Social History   Socioeconomic History   Marital status: Married    Spouse name: Not on file   Number of children: Not on file   Years of education: Not on file   Highest education level: Professional school degree (e.g., MD, DDS, DVM, JD)  Occupational History   Not on file  Tobacco Use   Smoking status: Never   Smokeless tobacco: Never  Vaping Use   Vaping status: Never Used  Substance and Sexual Activity   Alcohol use: Yes    Comment: 1 drink 5 times per week    Drug use: No   Sexual activity: Yes  Other Topics Concern   Not on file  Social History Narrative   Not on file   Social Determinants of Health   Financial Resource Strain: Low Risk  (05/21/2022)   Overall Financial Resource Strain (CARDIA)    Difficulty of Paying Living Expenses: Not very hard  Food Insecurity: No Food Insecurity (05/21/2022)   Hunger Vital Sign    Worried About Running Out of Food in the Last Year: Never true    Ran Out of Food in the Last Year: Never true  Transportation Needs:  No Transportation Needs (05/21/2022)   PRAPARE - Administrator, Civil Service (Medical): No    Lack of Transportation (Non-Medical): No  Physical Activity: Insufficiently Active (05/21/2022)   Exercise Vital Sign    Days of Exercise per Week: 2 days    Minutes of Exercise per Session: 50 min  Stress: Stress Concern Present (05/21/2022)   [employer] of Occupational Health - Occupational Stress Questionnaire    Feeling of Stress : To some extent  Social Connections: Unknown (05/21/2022)   Social Connection and Isolation Panel [NHANES]    Frequency of Communication with Friends and Family: Once a week    Frequency of Social Gatherings with Friends and Family: Not on file    Attends Religious Services: Never    Database administrator or Organizations: No    Attends Engineer, structural: Not on file    Marital Status: Married   Family History: Family History  Problem Relation Age of  Onset   Hypertension Mother    Leukemia Mother    Allergies: Allergies  Allergen Reactions   Benzonatate Other (See Comments)    Headaches    Levofloxacin Other (See Comments)   Other Nausea And Vomiting    general anesthesia     Cephalosporins Hives and Itching   Medications: See med rec.  Review of Systems: No headache, visual changes, nausea, vomiting, diarrhea, constipation, dizziness, abdominal pain, skin rash, fevers, chills, night sweats, swollen lymph nodes, weight loss, chest pain, body aches, joint swelling, muscle aches, shortness of breath, mood changes, visual or auditory hallucinations.  Objective:    General: Well Developed, well nourished, and in no acute distress.  Neuro: Alert and oriented x3, extra-ocular muscles intact, sensation grossly intact. Cranial nerves II through XII are intact, motor, sensory, and coordinative functions are all intact. HEENT: Normocephalic, atraumatic, pupils equal round reactive to light, neck supple, no masses, no lymphadenopathy, thyroid nonpalpable. Oropharynx, nasopharynx, external ear canals are unremarkable. Skin: Warm and dry, no rashes noted.  Seborrheic keratosis left upper thigh Cardiac: Regular rate and rhythm, no murmurs rubs or gallops.  Respiratory: Clear to auscultation bilaterally. Not using accessory muscles, speaking in full sentences.  Abdominal: Soft, nontender, nondistended, positive bowel sounds, no masses, no organomegaly.  Musculoskeletal: Shoulder, elbow, wrist, hip, knee, ankle stable, and with full range of motion.  Procedure: Real-time Ultrasound Guided injection of the left glenohumeral joint Device: Samsung HS60  Verbal informed consent obtained.  Time-out conducted.  Noted no overlying erythema, induration, or other signs of local infection.  Skin prepped in a sterile fashion.  Local anesthesia: Topical Ethyl chloride.  With sterile technique and under real time ultrasound guidance: Mild arthritic  changes noted, 1 cc Kenalog 40, 2 cc lidocaine, 2 cc bupivacaine injected easily Completed without difficulty  Advised to call if fevers/chills, erythema, induration, drainage, or persistent bleeding.  Images permanently stored and available for review in PACS.  Impression: Technically successful ultrasound guided injection.  Procedure: Real-time Ultrasound Guided injection of the right glenohumeral joint Device: Samsung HS60  Verbal informed consent obtained.  Time-out conducted.  Noted no overlying erythema, induration, or other signs of local infection.  Skin prepped in a sterile fashion.  Local anesthesia: Topical Ethyl chloride.  With sterile technique and under real time ultrasound guidance: Mild arthritic changes noted, 1 cc Kenalog 40, 2 cc lidocaine, 2 cc bupivacaine injected easily Completed without difficulty  Advised to call if fevers/chills, erythema, induration, drainage, or persistent bleeding.  Images permanently stored  and available for review in PACS.  Impression: Technically successful ultrasound guided injection.  Impression and Recommendations:    The patient was counselled, risk factors were discussed, anticipatory guidance given.  Annual physical exam Annual physical as above, adding Cologuard. Return in 1 year for this.  Bilateral supraspinatus tears Longstanding bilateral shoulder pain with impingement symptoms, he has had some subacromial injections in the past, it sounds like shoulder arthroscopy is planned for maybe sometime in September. Bilateral injections today, glenohumeral joint, this will communicate with the subacromial space considering rotator cuff tearing. This will also communicate with the biceps sheath. Shoulder pain does create significant insomnia, adding some Ambien for him to try. Also refilling his hydrocodone for as needed use.  Hyperlipidemia LDL and LpA mildly elevated, can consider increasing statin for a goal LDL <100 and LpA <75.    Awaiting pt response.   ____________________________________________ Ihor Austin. Benjamin Stain, M.D., ABFM., CAQSM., AME. Primary Care and Sports Medicine [place] [facility]  Adjunct Professor of Family Medicine  [employer] of Medicine  Restaurant manager, fast food

## 2022-08-31 NOTE — Assessment & Plan Note (Addendum)
Longstanding bilateral shoulder pain with impingement symptoms, he has had some subacromial injections in the past, it sounds like shoulder arthroscopy is planned for maybe sometime in September. Bilateral injections today, glenohumeral joint, this will communicate with the subacromial space considering rotator cuff tearing. This will also communicate with the biceps sheath. Shoulder pain does create significant insomnia, adding some Ambien for him to try. Also refilling his hydrocodone for as needed use.

## 2022-08-31 NOTE — Assessment & Plan Note (Signed)
Annual physical as above, adding Cologuard. Return in 1 year for this.

## 2022-09-03 DIAGNOSIS — M25512 Pain in left shoulder: Secondary | ICD-10-CM | POA: Diagnosis not present

## 2022-09-03 DIAGNOSIS — M25511 Pain in right shoulder: Secondary | ICD-10-CM | POA: Diagnosis not present

## 2022-09-03 LAB — PSA, TOTAL AND FREE
PSA, % Free: 16 % (calc) — ABNORMAL LOW (ref >25)
PSA, Free: 0.3 ng/mL
PSA, Total: 1.9 ng/mL (ref <=4.0)

## 2022-09-03 LAB — COMPREHENSIVE METABOLIC PANEL
AG Ratio: 2.6 (calc) — ABNORMAL HIGH (ref 1.0–2.5)
ALT: 24 U/L (ref 9–46)
AST: 25 U/L (ref 10–35)
Albumin: 4.5 g/dL (ref 3.6–5.1)
Alkaline phosphatase (APISO): 51 U/L (ref 35–144)
BUN: 20 mg/dL (ref 7–25)
CO2: 24 mmol/L (ref 20–32)
Calcium: 9.4 mg/dL (ref 8.6–10.3)
Chloride: 106 mmol/L (ref 98–110)
Creat: 0.92 mg/dL (ref 0.70–1.35)
Globulin: 1.7 g/dL (calc) — ABNORMAL LOW (ref 1.9–3.7)
Glucose, Bld: 91 mg/dL (ref 65–99)
Potassium: 4.1 mmol/L (ref 3.5–5.3)
Sodium: 141 mmol/L (ref 135–146)
Total Bilirubin: 1.6 mg/dL — ABNORMAL HIGH (ref 0.2–1.2)
Total Protein: 6.2 g/dL (ref 6.1–8.1)

## 2022-09-03 LAB — CBC
HCT: 46.7 % (ref 38.5–50.0)
Hemoglobin: 16.1 g/dL (ref 13.2–17.1)
MCH: 31.3 pg (ref 27.0–33.0)
MCHC: 34.5 g/dL (ref 32.0–36.0)
MCV: 90.7 fL (ref 80.0–100.0)
MPV: 10.9 fL (ref 7.5–12.5)
Platelets: 137 10*3/uL — ABNORMAL LOW (ref 140–400)
RBC: 5.15 10*6/uL (ref 4.20–5.80)
RDW: 13 % (ref 11.0–15.0)
WBC: 4.4 10*3/uL (ref 3.8–10.8)

## 2022-09-03 LAB — LIPID PANEL
Cholesterol: 189 mg/dL (ref <200)
HDL: 53 mg/dL (ref >=40)
LDL Cholesterol (Calc): 118 mg/dL (calc) — ABNORMAL HIGH
Non-HDL Cholesterol (Calc): 136 mg/dL (calc) — ABNORMAL HIGH (ref <130)
Total CHOL/HDL Ratio: 3.6 (calc) (ref <5.0)
Triglycerides: 79 mg/dL (ref <150)

## 2022-09-03 LAB — LIPOPROTEIN A (LPA): Lipoprotein (a): 115 nmol/L — ABNORMAL HIGH (ref <75)

## 2022-09-03 LAB — HEMOGLOBIN A1C
Hgb A1c MFr Bld: 5.2 % of total Hgb (ref <5.7)
Mean Plasma Glucose: 103 mg/dL
eAG (mmol/L): 5.7 mmol/L

## 2022-09-03 LAB — TSH: TSH: 1.39 mIU/L (ref 0.40–4.50)

## 2022-09-03 NOTE — Assessment & Plan Note (Signed)
LDL and LpA mildly elevated, can consider increasing statin for a goal LDL <100 and LpA <75.   Awaiting pt response.

## 2022-09-05 ENCOUNTER — Encounter: Payer: Self-pay | Admitting: Sports Medicine

## 2022-09-09 DIAGNOSIS — Z1211 Encounter for screening for malignant neoplasm of colon: Secondary | ICD-10-CM | POA: Diagnosis not present

## 2022-09-17 LAB — COLOGUARD: COLOGUARD: NEGATIVE

## 2022-09-24 DIAGNOSIS — Z952 Presence of prosthetic heart valve: Secondary | ICD-10-CM | POA: Diagnosis not present

## 2022-09-24 DIAGNOSIS — Z7901 Long term (current) use of anticoagulants: Secondary | ICD-10-CM | POA: Diagnosis not present

## 2022-09-27 ENCOUNTER — Other Ambulatory Visit (HOSPITAL_COMMUNITY): Payer: Self-pay

## 2022-09-30 ENCOUNTER — Other Ambulatory Visit (HOSPITAL_COMMUNITY): Payer: Self-pay

## 2022-09-30 DIAGNOSIS — Z20822 Contact with and (suspected) exposure to covid-19: Secondary | ICD-10-CM | POA: Diagnosis not present

## 2022-09-30 DIAGNOSIS — B349 Viral infection, unspecified: Secondary | ICD-10-CM | POA: Diagnosis not present

## 2022-09-30 MED ORDER — PROMETHAZINE-DM 6.25-15 MG/5ML PO SYRP
5.0000 mL | ORAL_SOLUTION | ORAL | 0 refills | Status: DC | PRN
  Filled 2022-09-30: qty 210, 7d supply, fill #0

## 2022-09-30 MED ORDER — BENZONATATE 200 MG PO CAPS
200.0000 mg | ORAL_CAPSULE | Freq: Three times a day (TID) | ORAL | 0 refills | Status: DC | PRN
  Filled 2022-09-30: qty 21, 7d supply, fill #0

## 2022-10-01 ENCOUNTER — Other Ambulatory Visit (HOSPITAL_COMMUNITY): Payer: Self-pay

## 2022-10-03 ENCOUNTER — Encounter (INDEPENDENT_AMBULATORY_CARE_PROVIDER_SITE_OTHER): Payer: PPO | Admitting: Sports Medicine

## 2022-10-03 ENCOUNTER — Other Ambulatory Visit (HOSPITAL_COMMUNITY): Payer: Self-pay

## 2022-10-03 DIAGNOSIS — J189 Pneumonia, unspecified organism: Secondary | ICD-10-CM | POA: Insufficient documentation

## 2022-10-03 DIAGNOSIS — R052 Subacute cough: Secondary | ICD-10-CM

## 2022-10-03 MED ORDER — AZITHROMYCIN 250 MG PO TABS
ORAL_TABLET | ORAL | 0 refills | Status: AC
Start: 2022-10-03 — End: 2022-10-08
  Filled 2022-10-03: qty 6, 5d supply, fill #0

## 2022-10-03 NOTE — Telephone Encounter (Signed)
I spent 5 total minutes of online digital evaluation and management services in this patient-initiated request for online care. 

## 2022-10-03 NOTE — Addendum Note (Signed)
Addended by: Monica Becton on: 10/03/2022 10:54 AM   Modules accepted: Orders

## 2022-10-03 NOTE — Assessment & Plan Note (Signed)
Zachary Calderon had a cough, with urgent care, treated conservatively with dextromethorphan and Tessalon Perles.  X-ray ultimately showed potential interstitial scarring versus atypical pneumonia changes. He was not started on antibiotics by the urgent care, we will go and send in azithromycin, I like to see him back in about a week and if not improving we will get repeat versus advanced imaging.

## 2022-10-04 DIAGNOSIS — M25512 Pain in left shoulder: Secondary | ICD-10-CM | POA: Diagnosis not present

## 2022-10-04 DIAGNOSIS — M25511 Pain in right shoulder: Secondary | ICD-10-CM | POA: Diagnosis not present

## 2022-10-10 ENCOUNTER — Other Ambulatory Visit (HOSPITAL_COMMUNITY): Payer: Self-pay

## 2022-10-10 ENCOUNTER — Ambulatory Visit (INDEPENDENT_AMBULATORY_CARE_PROVIDER_SITE_OTHER): Payer: PPO | Admitting: Physician Assistant

## 2022-10-10 ENCOUNTER — Encounter: Payer: Self-pay | Admitting: Physician Assistant

## 2022-10-10 VITALS — BP 140/99 | HR 74 | Ht 72.0 in | Wt 186.0 lb

## 2022-10-10 DIAGNOSIS — Z7901 Long term (current) use of anticoagulants: Secondary | ICD-10-CM

## 2022-10-10 DIAGNOSIS — G9331 Postviral fatigue syndrome: Secondary | ICD-10-CM | POA: Diagnosis not present

## 2022-10-10 DIAGNOSIS — R052 Subacute cough: Secondary | ICD-10-CM

## 2022-10-10 DIAGNOSIS — J189 Pneumonia, unspecified organism: Secondary | ICD-10-CM

## 2022-10-10 DIAGNOSIS — R0982 Postnasal drip: Secondary | ICD-10-CM

## 2022-10-10 DIAGNOSIS — Z952 Presence of prosthetic heart valve: Secondary | ICD-10-CM | POA: Diagnosis not present

## 2022-10-10 MED ORDER — METHYLPREDNISOLONE 4 MG PO TBPK
ORAL_TABLET | ORAL | 0 refills | Status: DC
Start: 2022-10-10 — End: 2023-02-15
  Filled 2022-10-10: qty 21, 6d supply, fill #0

## 2022-10-10 MED ORDER — WARFARIN SODIUM 1 MG PO TABS
ORAL_TABLET | ORAL | 1 refills | Status: DC
  Filled 2022-10-10: qty 30, 30d supply, fill #0
  Filled 2023-01-24: qty 30, 30d supply, fill #1
  Filled 2023-04-25: qty 30, 30d supply, fill #2

## 2022-10-10 NOTE — Progress Notes (Signed)
Acute Office Visit  Subjective:     Patient ID: Zachary Calderon, male    DOB: Aug 24, 1955, 67 y.o.   MRN: [id number]  Chief Complaint  Patient presents with   Cough    Post nasal drip    HPI Patient is in today for atypical pneumonia follow up. He went to [facility] on 8/11 for viral symptoms with most concerning being cough and given tessalon pearls and dextromorphan. His CXR showed possible ayptical pneumonia. He continued to get worse and reached out to PCP, Dr. Karie Schwalbe. He was given zpak at that time and finished it Sunday. His cough is better but he is still congested and having post nasal drip. No fever, chills, body aches. He has upcoming surgery on shoulder in September and wants to start feeling better.   He does need coumadin 1mg  to go with other doses to make combinations. S/P mitral valve replacement.    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   Inguinal hernia, right 07/20/2016   Medial epicondylitis, left 06/20/2017   Skin lesion of right arm 07/05/2020   Bilateral supraspinatus tears 09/01/2021   Atypical pneumonia 10/03/2022   Resolved Ambulatory Problems    Diagnosis Date Noted   MVP (mitral valve prolapse)    Mitral valve disorders(424.0) 12/26/2010   Primary osteoarthritis of right knee 06/24/2015   Post-viral cough syndrome 06/14/2017   Skin tag 06/20/2017   Sebaceous cyst 03/25/2020   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   Past Medical History:  Diagnosis Date   Arthritis    Baker's cyst    Bladder infection, chronic    BPH (benign prostatic hypertrophy)     Cancer (HCC)    Family history of hiatal hernia    Headache    Heart murmur    Hemorrhoids    History of kidney stones 2018   Pneumonia 2017   PONV (postoperative nausea and vomiting)    Proctitis    Tinnitus    Urinary frequency    Weak urinary stream       ROS  See HPI.     Objective:    BP (!) 140/99   Pulse 74   Ht 6' (1.829 m)   Wt 186 lb (84.4 kg)   SpO2 99%   BMI 25.23 kg/m  BP Readings from Last 3 Encounters:  10/10/22 (!) 140/99  08/31/22 (!) 151/90  05/25/22 137/82   Wt Readings from Last 3 Encounters:  10/10/22 186 lb (84.4 kg)  08/31/22 187 lb (84.8 kg)  09/01/21 189 lb (85.7 kg)      Physical Exam Constitutional:      Appearance: Normal appearance.  HENT:     Head: Normocephalic.     Right Ear: Tympanic membrane, ear canal and external ear normal. There is no impacted cerumen.     Left Ear: Tympanic membrane, ear canal and external ear normal. There is no impacted cerumen.     Nose: Nose normal.     Mouth/Throat:     Mouth: Mucous membranes are moist.     Pharynx: Posterior oropharyngeal erythema present. No oropharyngeal  exudate.     Comments: PND Eyes:     Conjunctiva/sclera: Conjunctivae normal.  Cardiovascular:     Rate and Rhythm: Normal rate and regular rhythm.  Pulmonary:     Effort: Pulmonary effort is normal.     Breath sounds: Normal breath sounds. No wheezing, rhonchi or rales.  Chest:     Chest wall: No tenderness.  Musculoskeletal:     Cervical back: Neck supple. No tenderness.     Right lower leg: No edema.     Left lower leg: No edema.  Lymphadenopathy:     Cervical: No cervical adenopathy.  Neurological:     General: No focal deficit present.     Mental Status: He is alert and oriented to person, place, and time.  Psychiatric:        Mood and Affect: Mood normal.          Assessment & Plan:  Marland KitchenMarland KitchenBlane "Brett Canales" was seen today for cough.  Diagnoses and all orders for this visit:  Atypical pneumonia -      methylPREDNISolone (MEDROL DOSEPAK) 4 MG TBPK tablet; Take as directed by package insert.  Subacute cough -     methylPREDNISolone (MEDROL DOSEPAK) 4 MG TBPK tablet; Take as directed by package insert.  PND (post-nasal drip) -     methylPREDNISolone (MEDROL DOSEPAK) 4 MG TBPK tablet; Take as directed by package insert.  Post viral syndrome -     methylPREDNISolone (MEDROL DOSEPAK) 4 MG TBPK tablet; Take as directed by package insert.  S/P MVR (mitral valve replacement) -     warfarin (COUMADIN) 1 MG tablet; Take daily in combination as directed by INR.  Anticoagulated -     warfarin (COUMADIN) 1 MG tablet; Take daily in combination as directed by INR.   Lungs sound good on exam No tachycardia, no fever, no swelling Start medrol dose pack Monitor INR more closely due to increase in INR Needed coumadin 1mg  to make his combination-sent to pharmacy If still having congestion/cough/SOB then will order chest CT in a few days   Tandy Gaw, PA-C

## 2022-10-11 ENCOUNTER — Other Ambulatory Visit (HOSPITAL_COMMUNITY): Payer: Self-pay

## 2022-10-15 ENCOUNTER — Other Ambulatory Visit (HOSPITAL_COMMUNITY): Payer: Self-pay

## 2022-10-15 MED ORDER — ENOXAPARIN SODIUM 80 MG/0.8ML IJ SOSY
PREFILLED_SYRINGE | INTRAMUSCULAR | 0 refills | Status: DC
  Filled 2022-10-15: qty 16, 10d supply, fill #0

## 2022-10-16 DIAGNOSIS — I483 Typical atrial flutter: Secondary | ICD-10-CM | POA: Diagnosis not present

## 2022-10-16 DIAGNOSIS — Z8679 Personal history of other diseases of the circulatory system: Secondary | ICD-10-CM | POA: Diagnosis not present

## 2022-10-16 DIAGNOSIS — Z9889 Other specified postprocedural states: Secondary | ICD-10-CM | POA: Diagnosis not present

## 2022-10-16 DIAGNOSIS — Z7901 Long term (current) use of anticoagulants: Secondary | ICD-10-CM | POA: Diagnosis not present

## 2022-10-16 DIAGNOSIS — Z952 Presence of prosthetic heart valve: Secondary | ICD-10-CM | POA: Diagnosis not present

## 2022-10-17 ENCOUNTER — Other Ambulatory Visit (HOSPITAL_COMMUNITY): Payer: Self-pay

## 2022-10-18 ENCOUNTER — Other Ambulatory Visit (HOSPITAL_COMMUNITY): Payer: Self-pay

## 2022-10-18 MED ORDER — ENOXAPARIN SODIUM 40 MG/0.4ML IJ SOSY
PREFILLED_SYRINGE | INTRAMUSCULAR | 1 refills | Status: DC
  Filled 2022-10-18: qty 4, 10d supply, fill #0

## 2022-10-21 ENCOUNTER — Other Ambulatory Visit: Payer: Self-pay | Admitting: Sports Medicine

## 2022-10-21 ENCOUNTER — Other Ambulatory Visit (HOSPITAL_COMMUNITY): Payer: Self-pay

## 2022-10-21 DIAGNOSIS — M75120 Complete rotator cuff tear or rupture of unspecified shoulder, not specified as traumatic: Secondary | ICD-10-CM

## 2022-10-21 DIAGNOSIS — I483 Typical atrial flutter: Secondary | ICD-10-CM | POA: Diagnosis not present

## 2022-10-21 DIAGNOSIS — G8929 Other chronic pain: Secondary | ICD-10-CM

## 2022-10-22 ENCOUNTER — Encounter: Payer: Self-pay | Admitting: Sports Medicine

## 2022-10-23 ENCOUNTER — Other Ambulatory Visit (HOSPITAL_COMMUNITY): Payer: Self-pay

## 2022-10-23 DIAGNOSIS — Z7901 Long term (current) use of anticoagulants: Secondary | ICD-10-CM | POA: Diagnosis not present

## 2022-10-23 DIAGNOSIS — Z952 Presence of prosthetic heart valve: Secondary | ICD-10-CM | POA: Diagnosis not present

## 2022-10-23 MED ORDER — WARFARIN SODIUM 5 MG PO TABS
5.0000 mg | ORAL_TABLET | Freq: Every day | ORAL | 3 refills | Status: AC
  Filled 2022-10-23: qty 90, 90d supply, fill #0
  Filled 2023-01-24: qty 90, 90d supply, fill #1
  Filled 2023-04-25: qty 90, 90d supply, fill #2

## 2022-10-23 MED ORDER — ZOLPIDEM TARTRATE 5 MG PO TABS
5.0000 mg | ORAL_TABLET | Freq: Every day | ORAL | 0 refills | Status: DC
Start: 2022-10-23 — End: 2023-09-06
  Filled 2022-10-23: qty 30, 15d supply, fill #0

## 2022-10-23 MED ORDER — HYDROCODONE-ACETAMINOPHEN 5-325 MG PO TABS
1.0000 | ORAL_TABLET | Freq: Three times a day (TID) | ORAL | 0 refills | Status: DC | PRN
  Filled 2022-10-23: qty 30, 10d supply, fill #0

## 2022-10-24 ENCOUNTER — Other Ambulatory Visit: Payer: Self-pay

## 2022-10-25 ENCOUNTER — Other Ambulatory Visit (HOSPITAL_COMMUNITY): Payer: Self-pay

## 2022-10-25 MED ORDER — ATORVASTATIN CALCIUM 20 MG PO TABS
20.0000 mg | ORAL_TABLET | Freq: Every day | ORAL | 0 refills | Status: DC
  Filled 2022-10-25: qty 90, 90d supply, fill #0

## 2022-10-26 ENCOUNTER — Other Ambulatory Visit (INDEPENDENT_AMBULATORY_CARE_PROVIDER_SITE_OTHER): Payer: PPO

## 2022-10-26 ENCOUNTER — Ambulatory Visit (INDEPENDENT_AMBULATORY_CARE_PROVIDER_SITE_OTHER): Payer: PPO | Admitting: Sports Medicine

## 2022-10-26 ENCOUNTER — Encounter: Payer: Self-pay | Admitting: Sports Medicine

## 2022-10-26 DIAGNOSIS — M1712 Unilateral primary osteoarthritis, left knee: Secondary | ICD-10-CM | POA: Diagnosis not present

## 2022-10-26 NOTE — Assessment & Plan Note (Addendum)
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 He x-rayed his knee at his practice and did note some loss of medial joint space as well as chondrocalcinosis. Today we did an aspiration and injection mostly for the diagnostic purposes of the effusion that was present today. We will send the fluid off for crystal analysis. He can return to see me on an as-needed basis.

## 2022-10-26 NOTE — Progress Notes (Signed)
    Procedures performed today:    Procedure: Real-time Ultrasound Guided aspiration/injection of left knee Device: Samsung HS60  Verbal informed consent obtained.  Time-out conducted.  Noted no overlying erythema, induration, or other signs of local infection.  Skin prepped in a sterile fashion.  Local anesthesia: Topical Ethyl chloride.  With sterile technique and under real time ultrasound guidance: Aspirated about 10 mL of clear, straw-colored fluid, syringe switched and 1 cc Kenalog 40, 2 cc lidocaine, 2 cc bupivacaine injected easily Completed without difficulty  Advised to call if fevers/chills, erythema, induration, drainage, or persistent bleeding.  Images permanently stored and available for review in PACS.  Impression: Technically successful ultrasound guided aspiration/injection.  Independent interpretation of notes and tests performed by another provider:   None.  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 He x-rayed his knee at his practice and did note some loss of medial joint space as well as chondrocalcinosis. Today we did an aspiration and injection mostly for the diagnostic purposes of the effusion that was present today. We will send the fluid off for crystal analysis. He can return to see me on an as-needed basis.    ____________________________________________ Ihor Austin. Benjamin Stain, M.D., ABFM., CAQSM., AME. Primary Care and Sports Medicine  [facility]  Adjunct Professor of Family Medicine  [employer]  Restaurant manager, [employer] food

## 2022-10-29 ENCOUNTER — Other Ambulatory Visit (HOSPITAL_COMMUNITY): Payer: Self-pay

## 2022-10-29 ENCOUNTER — Encounter: Payer: Self-pay | Admitting: Sports Medicine

## 2022-10-29 DIAGNOSIS — M25512 Pain in left shoulder: Secondary | ICD-10-CM | POA: Diagnosis not present

## 2022-10-29 DIAGNOSIS — M25511 Pain in right shoulder: Secondary | ICD-10-CM | POA: Diagnosis not present

## 2022-10-29 LAB — SYNOVIAL FLUID PANEL
Eos, Fluid: 0 %
Glucose, Fluid: 100 mg/dL
Lining Cells, Synovial: 2 %
Lymphs, Fluid: 80 %
Macrophages Fld: 4 %
Nuc cell # Fld: 388 {cells}/uL — ABNORMAL HIGH (ref 0–200)
Polys, Fluid: 14 %
Protein, Fluid: 2.9 g/dL

## 2022-10-29 LAB — SPECIMEN STATUS REPORT

## 2022-10-30 DIAGNOSIS — M1712 Unilateral primary osteoarthritis, left knee: Secondary | ICD-10-CM

## 2022-10-30 MED ORDER — TRIAMCINOLONE ACETONIDE 40 MG/ML IJ SUSP
40.0000 mg | Freq: Once | INTRAMUSCULAR | Status: AC
Start: 2022-10-30 — End: 2022-10-30
  Administered 2022-10-30: 40 mg via INTRAMUSCULAR

## 2022-10-30 NOTE — Addendum Note (Signed)
Addended by: Carren Rang A on: 10/30/2022 09:05 AM   Modules accepted: Orders

## 2022-11-05 ENCOUNTER — Other Ambulatory Visit (HOSPITAL_COMMUNITY): Payer: Self-pay

## 2022-11-05 DIAGNOSIS — M24111 Other articular cartilage disorders, right shoulder: Secondary | ICD-10-CM | POA: Diagnosis not present

## 2022-11-05 DIAGNOSIS — M7521 Bicipital tendinitis, right shoulder: Secondary | ICD-10-CM | POA: Diagnosis not present

## 2022-11-05 DIAGNOSIS — S46011A Strain of muscle(s) and tendon(s) of the rotator cuff of right shoulder, initial encounter: Secondary | ICD-10-CM | POA: Diagnosis not present

## 2022-11-05 DIAGNOSIS — X58XXXA Exposure to other specified factors, initial encounter: Secondary | ICD-10-CM | POA: Diagnosis not present

## 2022-11-05 DIAGNOSIS — M19011 Primary osteoarthritis, right shoulder: Secondary | ICD-10-CM | POA: Diagnosis not present

## 2022-11-05 DIAGNOSIS — M75121 Complete rotator cuff tear or rupture of right shoulder, not specified as traumatic: Secondary | ICD-10-CM | POA: Diagnosis not present

## 2022-11-05 DIAGNOSIS — M7551 Bursitis of right shoulder: Secondary | ICD-10-CM | POA: Diagnosis not present

## 2022-11-05 DIAGNOSIS — M21211 Flexion deformity, right shoulder: Secondary | ICD-10-CM | POA: Diagnosis not present

## 2022-11-05 DIAGNOSIS — Y999 Unspecified external cause status: Secondary | ICD-10-CM | POA: Diagnosis not present

## 2022-11-05 DIAGNOSIS — G8918 Other acute postprocedural pain: Secondary | ICD-10-CM | POA: Diagnosis not present

## 2022-11-05 DIAGNOSIS — M7501 Adhesive capsulitis of right shoulder: Secondary | ICD-10-CM | POA: Diagnosis not present

## 2022-11-05 DIAGNOSIS — M7541 Impingement syndrome of right shoulder: Secondary | ICD-10-CM | POA: Diagnosis not present

## 2022-11-05 DIAGNOSIS — S46111A Strain of muscle, fascia and tendon of long head of biceps, right arm, initial encounter: Secondary | ICD-10-CM | POA: Diagnosis not present

## 2022-11-05 DIAGNOSIS — M7581 Other shoulder lesions, right shoulder: Secondary | ICD-10-CM | POA: Diagnosis not present

## 2022-11-05 MED ORDER — CELECOXIB 100 MG PO CAPS
100.0000 mg | ORAL_CAPSULE | Freq: Two times a day (BID) | ORAL | 2 refills | Status: DC
  Filled 2022-11-05: qty 60, 30d supply, fill #0
  Filled 2022-11-30 (×2): qty 60, 30d supply, fill #1
  Filled 2022-12-22: qty 60, 30d supply, fill #2

## 2022-11-05 MED ORDER — ACETAMINOPHEN 500 MG PO TABS
1000.0000 mg | ORAL_TABLET | Freq: Three times a day (TID) | ORAL | 0 refills | Status: DC
  Filled 2022-11-05: qty 84, 14d supply, fill #0

## 2022-11-05 MED ORDER — OXYCODONE HCL 5 MG PO TABS
5.0000 mg | ORAL_TABLET | Freq: Four times a day (QID) | ORAL | 0 refills | Status: DC | PRN
  Filled 2022-11-05: qty 30, 5d supply, fill #0

## 2022-11-05 MED ORDER — ONDANSETRON HCL 4 MG PO TABS
4.0000 mg | ORAL_TABLET | Freq: Four times a day (QID) | ORAL | 0 refills | Status: DC | PRN
  Filled 2022-11-05: qty 10, 4d supply, fill #0

## 2022-11-12 DIAGNOSIS — Z952 Presence of prosthetic heart valve: Secondary | ICD-10-CM | POA: Diagnosis not present

## 2022-11-13 DIAGNOSIS — M19011 Primary osteoarthritis, right shoulder: Secondary | ICD-10-CM | POA: Diagnosis not present

## 2022-11-26 DIAGNOSIS — Z7901 Long term (current) use of anticoagulants: Secondary | ICD-10-CM | POA: Diagnosis not present

## 2022-11-26 DIAGNOSIS — Z952 Presence of prosthetic heart valve: Secondary | ICD-10-CM | POA: Diagnosis not present

## 2022-11-28 DIAGNOSIS — M25512 Pain in left shoulder: Secondary | ICD-10-CM | POA: Diagnosis not present

## 2022-11-28 DIAGNOSIS — M25511 Pain in right shoulder: Secondary | ICD-10-CM | POA: Diagnosis not present

## 2022-11-30 ENCOUNTER — Other Ambulatory Visit (HOSPITAL_COMMUNITY): Payer: Self-pay

## 2022-12-01 ENCOUNTER — Other Ambulatory Visit (HOSPITAL_COMMUNITY): Payer: Self-pay

## 2022-12-07 DIAGNOSIS — M25512 Pain in left shoulder: Secondary | ICD-10-CM | POA: Diagnosis not present

## 2022-12-07 DIAGNOSIS — M25511 Pain in right shoulder: Secondary | ICD-10-CM | POA: Diagnosis not present

## 2022-12-13 DIAGNOSIS — M25511 Pain in right shoulder: Secondary | ICD-10-CM | POA: Diagnosis not present

## 2022-12-13 DIAGNOSIS — M25512 Pain in left shoulder: Secondary | ICD-10-CM | POA: Diagnosis not present

## 2022-12-14 ENCOUNTER — Other Ambulatory Visit: Payer: Self-pay | Admitting: Medical Genetics

## 2022-12-14 DIAGNOSIS — Z006 Encounter for examination for normal comparison and control in clinical research program: Secondary | ICD-10-CM

## 2022-12-14 DIAGNOSIS — Z952 Presence of prosthetic heart valve: Secondary | ICD-10-CM | POA: Diagnosis not present

## 2022-12-18 DIAGNOSIS — M25511 Pain in right shoulder: Secondary | ICD-10-CM | POA: Diagnosis not present

## 2022-12-18 DIAGNOSIS — M25512 Pain in left shoulder: Secondary | ICD-10-CM | POA: Diagnosis not present

## 2022-12-24 ENCOUNTER — Other Ambulatory Visit (HOSPITAL_COMMUNITY): Payer: Self-pay

## 2022-12-25 ENCOUNTER — Other Ambulatory Visit (HOSPITAL_COMMUNITY): Payer: Self-pay

## 2022-12-25 DIAGNOSIS — M25511 Pain in right shoulder: Secondary | ICD-10-CM | POA: Diagnosis not present

## 2022-12-25 DIAGNOSIS — M25512 Pain in left shoulder: Secondary | ICD-10-CM | POA: Diagnosis not present

## 2022-12-28 ENCOUNTER — Other Ambulatory Visit (HOSPITAL_COMMUNITY): Payer: PPO

## 2023-01-01 DIAGNOSIS — M25512 Pain in left shoulder: Secondary | ICD-10-CM | POA: Diagnosis not present

## 2023-01-01 DIAGNOSIS — M25511 Pain in right shoulder: Secondary | ICD-10-CM | POA: Diagnosis not present

## 2023-01-03 ENCOUNTER — Other Ambulatory Visit (HOSPITAL_COMMUNITY): Payer: PPO | Attending: Medical Genetics

## 2023-01-10 DIAGNOSIS — M25511 Pain in right shoulder: Secondary | ICD-10-CM | POA: Diagnosis not present

## 2023-01-10 DIAGNOSIS — M25512 Pain in left shoulder: Secondary | ICD-10-CM | POA: Diagnosis not present

## 2023-01-16 DIAGNOSIS — M25511 Pain in right shoulder: Secondary | ICD-10-CM | POA: Diagnosis not present

## 2023-01-16 DIAGNOSIS — M25512 Pain in left shoulder: Secondary | ICD-10-CM | POA: Diagnosis not present

## 2023-01-17 ENCOUNTER — Other Ambulatory Visit (HOSPITAL_COMMUNITY): Payer: Self-pay

## 2023-01-18 ENCOUNTER — Other Ambulatory Visit (HOSPITAL_COMMUNITY): Payer: Self-pay

## 2023-01-18 ENCOUNTER — Other Ambulatory Visit: Payer: Self-pay

## 2023-01-18 MED ORDER — ATORVASTATIN CALCIUM 20 MG PO TABS
20.0000 mg | ORAL_TABLET | Freq: Every day | ORAL | 2 refills | Status: DC
  Filled 2023-01-18: qty 90, 90d supply, fill #0
  Filled 2023-04-25: qty 90, 90d supply, fill #1
  Filled 2023-07-22: qty 90, 90d supply, fill #2

## 2023-01-21 DIAGNOSIS — Z7901 Long term (current) use of anticoagulants: Secondary | ICD-10-CM | POA: Diagnosis not present

## 2023-01-21 DIAGNOSIS — Z952 Presence of prosthetic heart valve: Secondary | ICD-10-CM | POA: Diagnosis not present

## 2023-01-24 ENCOUNTER — Other Ambulatory Visit (HOSPITAL_COMMUNITY): Payer: Self-pay

## 2023-01-24 ENCOUNTER — Other Ambulatory Visit: Payer: Self-pay

## 2023-01-24 MED ORDER — CELECOXIB 100 MG PO CAPS
100.0000 mg | ORAL_CAPSULE | Freq: Two times a day (BID) | ORAL | 2 refills | Status: DC
  Filled 2023-01-24: qty 60, 30d supply, fill #0
  Filled 2023-03-20: qty 60, 30d supply, fill #1
  Filled 2023-04-25: qty 60, 30d supply, fill #2

## 2023-01-31 DIAGNOSIS — M25512 Pain in left shoulder: Secondary | ICD-10-CM | POA: Diagnosis not present

## 2023-01-31 DIAGNOSIS — M25511 Pain in right shoulder: Secondary | ICD-10-CM | POA: Diagnosis not present

## 2023-02-12 DIAGNOSIS — J019 Acute sinusitis, unspecified: Secondary | ICD-10-CM | POA: Diagnosis not present

## 2023-02-12 DIAGNOSIS — J40 Bronchitis, not specified as acute or chronic: Secondary | ICD-10-CM | POA: Diagnosis not present

## 2023-02-12 DIAGNOSIS — R051 Acute cough: Secondary | ICD-10-CM | POA: Diagnosis not present

## 2023-02-14 DIAGNOSIS — M25512 Pain in left shoulder: Secondary | ICD-10-CM | POA: Diagnosis not present

## 2023-02-14 DIAGNOSIS — M25511 Pain in right shoulder: Secondary | ICD-10-CM | POA: Diagnosis not present

## 2023-02-15 ENCOUNTER — Ambulatory Visit (INDEPENDENT_AMBULATORY_CARE_PROVIDER_SITE_OTHER): Payer: PPO

## 2023-02-15 ENCOUNTER — Other Ambulatory Visit: Payer: Self-pay | Admitting: Physician Assistant

## 2023-02-15 ENCOUNTER — Encounter: Payer: Self-pay | Admitting: Physician Assistant

## 2023-02-15 ENCOUNTER — Ambulatory Visit (INDEPENDENT_AMBULATORY_CARE_PROVIDER_SITE_OTHER): Payer: PPO | Admitting: Physician Assistant

## 2023-02-15 ENCOUNTER — Other Ambulatory Visit (HOSPITAL_COMMUNITY): Payer: Self-pay

## 2023-02-15 VITALS — BP 145/88 | HR 94 | Temp 98.1°F | Ht 72.0 in | Wt 183.1 lb

## 2023-02-15 DIAGNOSIS — J329 Chronic sinusitis, unspecified: Secondary | ICD-10-CM

## 2023-02-15 DIAGNOSIS — R059 Cough, unspecified: Secondary | ICD-10-CM | POA: Diagnosis not present

## 2023-02-15 DIAGNOSIS — J40 Bronchitis, not specified as acute or chronic: Secondary | ICD-10-CM

## 2023-02-15 MED ORDER — HYDROCOD POLI-CHLORPHE POLI ER 10-8 MG/5ML PO SUER: 5.0000 mL | Freq: Two times a day (BID) | ORAL | 0 refills | Status: DC | PRN

## 2023-02-15 MED ORDER — HYDROCODONE BIT-HOMATROP MBR 5-1.5 MG/5ML PO SOLN
5.0000 mL | Freq: Three times a day (TID) | ORAL | 0 refills | Status: DC | PRN
  Filled 2023-02-15 (×2): qty 120, 8d supply, fill #0

## 2023-02-15 MED ORDER — HYDROCODONE BIT-HOMATROP MBR 5-1.5 MG/5ML PO SOLN: 5.0000 mL | Freq: Three times a day (TID) | ORAL | 0 refills | Status: DC | PRN

## 2023-02-15 MED ORDER — PROMETHAZINE-DM 6.25-15 MG/5ML PO SYRP
5.0000 mL | ORAL_SOLUTION | Freq: Four times a day (QID) | ORAL | 0 refills | Status: DC | PRN
  Filled 2023-02-15: qty 118, 6d supply, fill #0

## 2023-02-15 MED ORDER — HYDROCOD POLI-CHLORPHE POLI ER 10-8 MG/5ML PO SUER
5.0000 mL | Freq: Two times a day (BID) | ORAL | 0 refills | Status: DC | PRN
  Filled 2023-02-15: qty 70, 7d supply, fill #0

## 2023-02-15 MED ORDER — PROMETHAZINE-DM 6.25-15 MG/5ML PO SYRP: 5.0000 mL | ORAL_SOLUTION | Freq: Four times a day (QID) | ORAL | 0 refills | Status: DC | PRN

## 2023-02-15 MED ORDER — METHYLPREDNISOLONE 4 MG PO TBPK
ORAL_TABLET | ORAL | 0 refills | Status: DC
  Filled 2023-02-15: qty 21, 6d supply, fill #0

## 2023-02-15 NOTE — Progress Notes (Signed)
Lungs are clear! No sign of pneumonia. Finish the doxycycline and add the prednisone and cough syrup.

## 2023-02-15 NOTE — Patient Instructions (Addendum)
Get chest xray today Continue doxycycline and added prednisone and tussionenex cough syrup  Acute Bronchitis, Adult  Acute bronchitis is sudden inflammation of the main airways (bronchi) that come off the windpipe (trachea) in the lungs. The swelling causes the airways to get smaller and make more mucus than normal. This can make it hard to breathe and can cause coughing or noisy breathing (wheezing). Acute bronchitis may last several weeks. The cough may last longer. Allergies, asthma, and exposure to smoke may make the condition worse. What are the causes? This condition can be caused by germs and by substances that irritate the lungs, including: Cold and flu viruses. The most common cause of this condition is the virus that causes the common cold. Bacteria. This is less common. Breathing in substances that irritate the lungs, including: Smoke from cigarettes and other forms of tobacco. Dust and pollen. Fumes from household cleaning products, gases, or burned fuel. Indoor or outdoor air pollution. What increases the risk? The following factors may make you more likely to develop this condition: A weak body's defense system, also called the immune system. A condition that affects your lungs and breathing, such as asthma. What are the signs or symptoms? Common symptoms of this condition include: Coughing. This may bring up clear, yellow, or green mucus from your lungs (sputum). Wheezing. Runny or stuffy nose. Having too much mucus in your lungs (chest congestion). Shortness of breath. Aches and pains, including sore throat or chest. How is this diagnosed? This condition is usually diagnosed based on: Your symptoms and medical history. A physical exam. You may also have other tests, including tests to rule out other conditions, such as pneumonia. These tests include: A test of lung function. Test of a mucus sample to look for the presence of bacteria. Tests to check the oxygen level  in your blood. Blood tests. Chest X-ray. How is this treated? Most cases of acute bronchitis clear up over time without treatment. Your health care provider may recommend: Drinking more fluids to help thin your mucus so it is easier to cough up. Taking inhaled medicine (inhaler) to improve air flow in and out of your lungs. Using a vaporizer or a humidifier. These are machines that add water to the air to help you breathe better. Taking a medicine that thins mucus and clears congestion (expectorant). Taking a medicine that prevents or stops coughing (cough suppressant). It is not common to take an antibiotic medicine for this condition. Follow these instructions at home:  Take over-the-counter and prescription medicines only as told by your health care provider. Use an inhaler, vaporizer, or humidifier as told by your health care provider. Take two teaspoons (10 mL) of honey at bedtime to lessen coughing at night. Drink enough fluid to keep your urine pale yellow. Do not use any products that contain nicotine or tobacco. These products include cigarettes, chewing tobacco, and vaping devices, such as e-cigarettes. If you need help quitting, ask your health care provider. Get plenty of rest. Return to your normal activities as told by your health care provider. Ask your health care provider what activities are safe for you. Keep all follow-up visits. This is important. How is this prevented? To lower your risk of getting this condition again: Wash your hands often with soap and water for at least 20 seconds. If soap and water are not available, use hand sanitizer. Avoid contact with people who have cold symptoms. Try not to touch your mouth, nose, or eyes with your hands.  Avoid breathing in smoke or chemical fumes. Breathing smoke or chemical fumes will make your condition worse. Get the flu shot every year. Contact a health care provider if: Your symptoms do not improve after 2  weeks. You have trouble coughing up the mucus. Your cough keeps you awake at night. You have a fever. Get help right away if you: Cough up blood. Feel pain in your chest. Have severe shortness of breath. Faint or keep feeling like you are going to faint. Have a severe headache. Have a fever or chills that get worse. These symptoms may represent a serious problem that is an emergency. Do not wait to see if the symptoms will go away. Get medical help right away. Call your local emergency services (911 in the [place] Do not drive yourself to the hospital. Summary Acute bronchitis is inflammation of the main airways (bronchi) that come off the windpipe (trachea) in the lungs. The swelling causes the airways to get smaller and make more mucus than normal. Drinking more fluids can help thin your mucus so it is easier to cough up. Take over-the-counter and prescription medicines only as told by your health care provider. Do not use any products that contain nicotine or tobacco. These products include cigarettes, chewing tobacco, and vaping devices, such as e-cigarettes. If you need help quitting, ask your health care provider. Contact a health care provider if your symptoms do not improve after 2 weeks. This information is not intended to replace advice given to you by your health care provider. Make sure you discuss any questions you have with your health care provider. Document Revised: 05/18/2021 Document Reviewed: 06/08/2020 Elsevier Patient Education  2024 [employer].

## 2023-02-15 NOTE — Progress Notes (Signed)
Acute Office Visit  Subjective:     Patient ID: Zachary Calderon, male    DOB: 1955/12/27, 67 y.o.   MRN: [id number]  Chief Complaint  Patient presents with   Cough    Onset 1 month , progressively getting worse   frequest post nasal drip sinus congestion and cough spell that last for over a min at a time  , pt states he has Zachary Calderon, pt was seen at urgent care and  was given doxy, 100 mg for 7 days pt states it has not gotten any better and does not want tessalon pearls,    HPI Patient is in today for follow up on sinobronchitis for the last month. He went to UC on 12/24 and given doxycycline. He does not feel better. No fever, chills, body aches. He has sinus pressure, congestion, ear pressure, ST, productive cough. He does not want tessalon pearls because they make his head hurt. Cough is keeping him up at night. He blows out green/yellow mucus. No other sick contacts.    .. 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   Inguinal hernia, right 07/20/2016   Medial epicondylitis, left 06/20/2017   Skin lesion of right arm 07/05/2020   Bilateral supraspinatus tears 09/01/2021   Atypical pneumonia 10/03/2022   Primary osteoarthritis of left knee 10/26/2022   Resolved Ambulatory Problems    Diagnosis Date Noted   MVP (mitral valve prolapse)    Mitral valve disease 12/26/2010   Primary osteoarthritis of right knee 06/24/2015   Post-viral cough syndrome 06/14/2017   Skin tag 06/20/2017   Sebaceous cyst 03/25/2020   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      Objective:    BP (!) 145/88   Pulse 94   Temp 98.1 F (36.7 C) (Oral)   Ht 6' (1.829 m)   Wt 183 lb 2.1 oz (83.1 kg)   SpO2 98%   BMI 24.84 kg/m  BP Readings from Last 3 Encounters:  02/15/23 (!) 145/88  10/10/22 (!) 140/99  08/31/22 (!) 151/90   Wt Readings from Last 3 Encounters:  02/15/23 183 lb 2.1 oz (83.1 kg)  10/10/22 186 lb (84.4 kg)  08/31/22 187 lb (84.8 kg)      Physical Exam Constitutional:      Appearance: Normal appearance.  HENT:     Head: Normocephalic.     Right Ear: Tympanic membrane, ear canal and external ear normal. There is no impacted cerumen.     Left Ear: Ear canal and  external ear normal. There is impacted cerumen.     Nose: Congestion present.     Mouth/Throat:     Mouth: Mucous membranes are moist.     Pharynx: Posterior oropharyngeal erythema present. No oropharyngeal exudate.  Eyes:     Conjunctiva/sclera: Conjunctivae normal.  Cardiovascular:     Rate and Rhythm: Normal rate and regular rhythm.     Pulses: Normal pulses.  Pulmonary:     Effort: Pulmonary effort is normal.     Breath sounds: Normal breath sounds. No wheezing, rhonchi or rales.  Musculoskeletal:     Cervical back: Normal range of motion and neck supple. No tenderness.  Lymphadenopathy:     Cervical: No cervical adenopathy.  Neurological:     General: No focal deficit present.     Mental Status: He is alert and oriented to person, place, and time.  Psychiatric:        Mood and Affect: Mood normal.          Assessment & Plan:  Zachary Calderon KitchenMarland KitchenLarrie "Zachary Calderon" was seen today  for cough.  Diagnoses and all orders for this visit:  Sinobronchitis -     DG Chest 2 View; Future -     methylPREDNISolone (MEDROL DOSEPAK) 4 MG TBPK tablet; Take as directed by package insert. -     chlorpheniramine-HYDROcodone (TUSSIONEX) 10-8 MG/5ML; Take 5 mLs by mouth every 12 (twelve) hours as needed.   Vitals reassuring Lung exam with no abnormal sounds Will get CXR due to not getting significantly better to make sure abx does not need to be changed Continue doxycycline and added medrol dose pack and tussionex Encouraged delsym and cough drops during the day Stay hydrated Follow up as needed or if symptoms worsening or not improving  Tandy Gaw, PA-C

## 2023-02-15 NOTE — Addendum Note (Signed)
Addended by: Jomarie Longs on: 02/15/2023 03:59 PM   Modules accepted: Orders

## 2023-02-15 NOTE — Addendum Note (Signed)
Addended byRoselyn Reef on: 02/15/2023 03:39 PM   Modules accepted: Orders

## 2023-02-15 NOTE — Addendum Note (Signed)
Addended by: Jomarie Longs on: 02/15/2023 03:31 PM   Modules accepted: Orders

## 2023-02-21 DIAGNOSIS — R058 Other specified cough: Secondary | ICD-10-CM | POA: Diagnosis not present

## 2023-02-22 MED ORDER — FLUTICASONE PROPIONATE 50 MCG/ACT NA SUSP: 2.0000 | Freq: Every day | NASAL | 0 refills | Status: DC

## 2023-02-22 NOTE — Addendum Note (Signed)
 Addended by: Jomarie Longs on: 02/22/2023 01:10 PM   Modules accepted: Orders

## 2023-02-25 DIAGNOSIS — M25512 Pain in left shoulder: Secondary | ICD-10-CM | POA: Diagnosis not present

## 2023-02-25 DIAGNOSIS — M25511 Pain in right shoulder: Secondary | ICD-10-CM | POA: Diagnosis not present

## 2023-03-14 DIAGNOSIS — M25511 Pain in right shoulder: Secondary | ICD-10-CM | POA: Diagnosis not present

## 2023-03-14 DIAGNOSIS — M25512 Pain in left shoulder: Secondary | ICD-10-CM | POA: Diagnosis not present

## 2023-03-18 DIAGNOSIS — Z952 Presence of prosthetic heart valve: Secondary | ICD-10-CM | POA: Diagnosis not present

## 2023-03-18 DIAGNOSIS — Z7901 Long term (current) use of anticoagulants: Secondary | ICD-10-CM | POA: Diagnosis not present

## 2023-04-02 DIAGNOSIS — M19011 Primary osteoarthritis, right shoulder: Secondary | ICD-10-CM | POA: Diagnosis not present

## 2023-04-16 DIAGNOSIS — M25511 Pain in right shoulder: Secondary | ICD-10-CM | POA: Diagnosis not present

## 2023-04-16 DIAGNOSIS — M25512 Pain in left shoulder: Secondary | ICD-10-CM | POA: Diagnosis not present

## 2023-04-25 ENCOUNTER — Other Ambulatory Visit: Payer: Self-pay

## 2023-04-26 ENCOUNTER — Other Ambulatory Visit (HOSPITAL_COMMUNITY): Payer: Self-pay

## 2023-04-30 DIAGNOSIS — M25511 Pain in right shoulder: Secondary | ICD-10-CM | POA: Diagnosis not present

## 2023-04-30 DIAGNOSIS — M25512 Pain in left shoulder: Secondary | ICD-10-CM | POA: Diagnosis not present

## 2023-05-06 DIAGNOSIS — Z952 Presence of prosthetic heart valve: Secondary | ICD-10-CM | POA: Diagnosis not present

## 2023-05-06 DIAGNOSIS — Z7901 Long term (current) use of anticoagulants: Secondary | ICD-10-CM | POA: Diagnosis not present

## 2023-05-14 DIAGNOSIS — M25511 Pain in right shoulder: Secondary | ICD-10-CM | POA: Diagnosis not present

## 2023-05-14 DIAGNOSIS — M25512 Pain in left shoulder: Secondary | ICD-10-CM | POA: Diagnosis not present

## 2023-05-20 ENCOUNTER — Other Ambulatory Visit (HOSPITAL_COMMUNITY): Payer: Self-pay

## 2023-05-20 ENCOUNTER — Other Ambulatory Visit: Payer: Self-pay

## 2023-05-20 MED ORDER — WARFARIN SODIUM 1 MG PO TABS
1.0000 mg | ORAL_TABLET | Freq: Every day | ORAL | 3 refills | Status: AC
  Filled 2023-05-20: qty 90, 90d supply, fill #0
  Filled 2023-10-14: qty 90, 90d supply, fill #1
  Filled 2024-02-13: qty 90, 90d supply, fill #2

## 2023-05-20 MED ORDER — WARFARIN SODIUM 6 MG PO TABS
6.0000 mg | ORAL_TABLET | Freq: Every day | ORAL | 3 refills | Status: AC
  Filled 2023-05-20: qty 90, 90d supply, fill #0
  Filled 2023-07-18 – 2023-08-17 (×2): qty 90, 90d supply, fill #1
  Filled 2023-11-15: qty 90, 90d supply, fill #2
  Filled 2024-02-22: qty 90, 90d supply, fill #3

## 2023-06-17 DIAGNOSIS — Z952 Presence of prosthetic heart valve: Secondary | ICD-10-CM | POA: Diagnosis not present

## 2023-06-17 DIAGNOSIS — Z7901 Long term (current) use of anticoagulants: Secondary | ICD-10-CM | POA: Diagnosis not present

## 2023-06-19 ENCOUNTER — Other Ambulatory Visit (HOSPITAL_COMMUNITY): Payer: Self-pay

## 2023-06-19 MED ORDER — CELECOXIB 100 MG PO CAPS
100.0000 mg | ORAL_CAPSULE | Freq: Two times a day (BID) | ORAL | 2 refills | Status: DC
  Filled 2023-06-19: qty 60, 30d supply, fill #0
  Filled 2023-07-18: qty 60, 30d supply, fill #1
  Filled 2023-09-09: qty 60, 30d supply, fill #2

## 2023-06-25 DIAGNOSIS — M19011 Primary osteoarthritis, right shoulder: Secondary | ICD-10-CM | POA: Diagnosis not present

## 2023-07-19 ENCOUNTER — Other Ambulatory Visit (HOSPITAL_COMMUNITY): Payer: Self-pay

## 2023-07-19 ENCOUNTER — Other Ambulatory Visit: Payer: Self-pay

## 2023-07-19 DIAGNOSIS — M25511 Pain in right shoulder: Secondary | ICD-10-CM | POA: Diagnosis not present

## 2023-07-24 ENCOUNTER — Other Ambulatory Visit (HOSPITAL_COMMUNITY): Payer: Self-pay

## 2023-07-24 ENCOUNTER — Other Ambulatory Visit (HOSPITAL_BASED_OUTPATIENT_CLINIC_OR_DEPARTMENT_OTHER): Payer: Self-pay

## 2023-07-24 DIAGNOSIS — M79645 Pain in left finger(s): Secondary | ICD-10-CM | POA: Diagnosis not present

## 2023-07-24 MED ORDER — DOXYCYCLINE HYCLATE 100 MG PO TABS
ORAL_TABLET | ORAL | 0 refills | Status: DC
  Filled 2023-07-24: qty 14, 7d supply, fill #0

## 2023-07-29 DIAGNOSIS — Z7901 Long term (current) use of anticoagulants: Secondary | ICD-10-CM | POA: Diagnosis not present

## 2023-07-29 DIAGNOSIS — Z952 Presence of prosthetic heart valve: Secondary | ICD-10-CM | POA: Diagnosis not present

## 2023-09-03 ENCOUNTER — Encounter: Payer: Self-pay | Admitting: Sports Medicine

## 2023-09-03 DIAGNOSIS — E7800 Pure hypercholesterolemia, unspecified: Secondary | ICD-10-CM

## 2023-09-03 DIAGNOSIS — Z7901 Long term (current) use of anticoagulants: Secondary | ICD-10-CM

## 2023-09-03 DIAGNOSIS — N401 Enlarged prostate with lower urinary tract symptoms: Secondary | ICD-10-CM

## 2023-09-03 DIAGNOSIS — M25511 Pain in right shoulder: Secondary | ICD-10-CM | POA: Diagnosis not present

## 2023-09-03 DIAGNOSIS — R739 Hyperglycemia, unspecified: Secondary | ICD-10-CM

## 2023-09-05 DIAGNOSIS — R739 Hyperglycemia, unspecified: Secondary | ICD-10-CM | POA: Diagnosis not present

## 2023-09-05 DIAGNOSIS — N401 Enlarged prostate with lower urinary tract symptoms: Secondary | ICD-10-CM | POA: Diagnosis not present

## 2023-09-05 DIAGNOSIS — E7800 Pure hypercholesterolemia, unspecified: Secondary | ICD-10-CM | POA: Diagnosis not present

## 2023-09-05 DIAGNOSIS — R350 Frequency of micturition: Secondary | ICD-10-CM | POA: Diagnosis not present

## 2023-09-06 ENCOUNTER — Encounter: Payer: Self-pay | Admitting: Sports Medicine

## 2023-09-06 ENCOUNTER — Telehealth: Payer: Self-pay

## 2023-09-06 ENCOUNTER — Ambulatory Visit: Payer: Self-pay | Admitting: Sports Medicine

## 2023-09-06 ENCOUNTER — Ambulatory Visit (INDEPENDENT_AMBULATORY_CARE_PROVIDER_SITE_OTHER): Payer: PPO | Admitting: Sports Medicine

## 2023-09-06 VITALS — BP 106/64 | HR 81 | Temp 98.0°F | Resp 20 | Ht 70.0 in | Wt 175.0 lb

## 2023-09-06 DIAGNOSIS — M75100 Unspecified rotator cuff tear or rupture of unspecified shoulder, not specified as traumatic: Secondary | ICD-10-CM

## 2023-09-06 DIAGNOSIS — Z0000 Encounter for general adult medical examination without abnormal findings: Secondary | ICD-10-CM | POA: Diagnosis not present

## 2023-09-06 LAB — COMPREHENSIVE METABOLIC PANEL WITH GFR
ALT: 28 IU/L (ref 0–44)
AST: 37 IU/L (ref 0–40)
Albumin: 4.6 g/dL (ref 3.9–4.9)
Alkaline Phosphatase: 65 IU/L (ref 44–121)
BUN/Creatinine Ratio: 20 (ref 10–24)
BUN: 19 mg/dL (ref 8–27)
Bilirubin Total: 1.3 mg/dL — ABNORMAL HIGH (ref 0.0–1.2)
CO2: 24 mmol/L (ref 20–29)
Calcium: 9.3 mg/dL (ref 8.6–10.2)
Chloride: 101 mmol/L (ref 96–106)
Creatinine, Ser: 0.95 mg/dL (ref 0.76–1.27)
Globulin, Total: 1.7 g/dL (ref 1.5–4.5)
Glucose: 82 mg/dL (ref 70–99)
Potassium: 4.6 mmol/L (ref 3.5–5.2)
Sodium: 138 mmol/L (ref 134–144)
Total Protein: 6.3 g/dL (ref 6.0–8.5)
eGFR: 87 mL/min/1.73 (ref >59)

## 2023-09-06 LAB — LIPID PANEL
Chol/HDL Ratio: 3.2 ratio (ref 0.0–5.0)
Cholesterol, Total: 188 mg/dL (ref 100–199)
HDL: 59 mg/dL (ref >39)
LDL Chol Calc (NIH): 113 mg/dL — ABNORMAL HIGH (ref 0–99)
Triglycerides: 88 mg/dL (ref 0–149)
VLDL Cholesterol Cal: 16 mg/dL (ref 5–40)

## 2023-09-06 LAB — CBC
Hematocrit: 51 % (ref 37.5–51.0)
Hemoglobin: 17.1 g/dL (ref 13.0–17.7)
MCH: 31.8 pg (ref 26.6–33.0)
MCHC: 33.5 g/dL (ref 31.5–35.7)
MCV: 95 fL (ref 79–97)
Platelets: 168 x10E3/uL (ref 150–450)
RBC: 5.37 x10E6/uL (ref 4.14–5.80)
RDW: 12.8 % (ref 11.6–15.4)
WBC: 4.3 x10E3/uL (ref 3.4–10.8)

## 2023-09-06 LAB — PSA, TOTAL AND FREE
PSA, Free Pct: 15.3 %
PSA, Free: 0.29 ng/mL
Prostate Specific Ag, Serum: 1.9 ng/mL (ref 0.0–4.0)

## 2023-09-06 LAB — TSH: TSH: 1.58 u[IU]/mL (ref 0.450–4.500)

## 2023-09-06 LAB — HEMOGLOBIN A1C
Est. average glucose Bld gHb Est-mCnc: 100 mg/dL
Hgb A1c MFr Bld: 5.1 % (ref 4.8–5.6)

## 2023-09-06 LAB — PROTIME-INR
INR: 2.7 — ABNORMAL HIGH (ref 0.9–1.2)
Prothrombin Time: 27.7 s — ABNORMAL HIGH (ref 9.1–12.0)

## 2023-09-06 NOTE — Telephone Encounter (Signed)
 Copied from CRM [id number]. Topic: Clinical - Lab/Test Results >> Sep 06, 2023  9:54 AM Carrielelia G wrote: Reason for CRM: Patient called back, states he would like to go over results at todays appointment.

## 2023-09-06 NOTE — Telephone Encounter (Signed)
 Sent to PCP ?

## 2023-09-06 NOTE — Assessment & Plan Note (Addendum)
 Medicare physical as above, up-to-date on screening measures, labs look great, return to see me in 1 year.

## 2023-09-06 NOTE — Assessment & Plan Note (Signed)
 Postop the right, doing okay. Left side still with a bit of pain but not enough to consider an operation. He is considering PRP, we can do this in the future if he desires.

## 2023-09-06 NOTE — Progress Notes (Signed)
 Subjective:   Zachary Calderon is a 68 y.o. male who presents for Medicare Annual/Subsequent preventive examination.  Visit Complete: In person  Medicare AWV questionnaire completed verbally.     Objective:    Today's Vitals   09/06/23 1409  BP: 106/64  Pulse: 81  Resp: 20  Temp: 98 F (36.7 C)  TempSrc: Oral  SpO2: 98%  Weight: 175 lb (79.4 kg)  Height: 5' 10 (1.778 m)   Body mass index is 25.11 kg/m.     10/03/2016    2:02 PM 02/08/2016    3:53 PM 11/07/2015    1:30 PM 10/28/2015    2:24 PM  Advanced Directives  Does Patient Have a Medical Advance Directive? No  No  No  No   Would patient like information on creating a medical advance directive? Yes (MAU/Ambulatory/Procedural Areas - Information given)   Yes - Educational materials given  Yes - Educational materials given      Data saved with a previous flowsheet row definition    Current Medications (verified) Outpatient Encounter Medications as of 09/06/2023  Medication Sig   aspirin-acetaminophen -caffeine (EXCEDRIN MIGRAINE) 250-250-65 MG tablet Take 2-3 tablets by mouth 3 (three) times daily as needed for headache or migraine.   atorvastatin  (LIPITOR) 20 MG tablet Take 1 tablet (20 mg total) by mouth daily.   b complex vitamins tablet Take 1 tablet by mouth daily.   Calcium -Magnesium-Zinc (CAL-MAG-ZINC PO) Take 2 tablets by mouth daily.   celecoxib  (CELEBREX ) 100 MG capsule Take 1 capsule (100 mg total) by mouth 2 (two) times daily.   CHROMIUM PICOLINATE PO Take 1 tablet by mouth daily.   co-enzyme Q-10 30 MG capsule Take by mouth.   FA-B6-B12-D-Omega 3-Phytoster (ANIMI-3) 1 MG CAPS Take by mouth.   ibuprofen (ADVIL) 800 MG tablet Take by mouth.   warfarin (COUMADIN ) 1 MG tablet Take daily in combination as directed by INR.   warfarin (COUMADIN ) 1 MG tablet Take 6 mg tablet along with 1 mg tablet for a dose up to 7 mg daily or as directed   warfarin (COUMADIN ) 5 MG tablet Take 1 tablet by mouth daily (along  with 1 mg tablet for a total daily dose of 6 mg daily) or as directed   warfarin (COUMADIN ) 6 MG tablet Take 6 mg tablet along with 1 mg tablet for a dose up to 7 mg daily or as directed   warfarin (COUMADIN ) 6 MG tablet Take 6 mg by mouth daily.   [DISCONTINUED] atorvastatin  (LIPITOR) 20 MG tablet Take 20 mg by mouth daily.   [DISCONTINUED] atorvastatin  (LIPITOR) 20 MG tablet Take 1 tablet by mouth daily.   [DISCONTINUED] celecoxib  (CELEBREX ) 200 MG capsule Take 200 mg by mouth daily.   [DISCONTINUED] Cholecalciferol 125 MCG (5000 UT) TABS Take by mouth.   [DISCONTINUED] Colchicine  0.6 MG CAPS Take 1 capsule by mouth 2 (two) times daily as needed for pain ( Pleuritic chest pain)   [DISCONTINUED] doxycycline  (VIBRA -TABS) 100 MG tablet Take 1 tablet (100 mg total) by mouth 2 (two) times a day for 7 days. Take with 8 oz water. Do not lie down for at least 30 minutes after.   [DISCONTINUED] fluticasone  (FLONASE ) 50 MCG/ACT nasal spray Place 2 sprays into both nostrils daily.   [DISCONTINUED] HYDROcodone -acetaminophen  (NORCO/VICODIN) 5-325 MG tablet Take 1 tablet by mouth every 8 hours as needed for moderate pain.   [DISCONTINUED] methylPREDNISolone  (MEDROL  DOSEPAK) 4 MG TBPK tablet Take as directed on package. (Patient not taking: Reported on  09/06/2023)   [DISCONTINUED] Omega-3 Fatty Acids (FISH OIL) 1000 MG CAPS Take 2,000 mg by mouth daily.   [DISCONTINUED] omeprazole  (PRILOSEC) 40 MG capsule Take 1 capsule (40 mg total) by mouth 2 (two) times daily before a meal.   [DISCONTINUED] zolpidem  (AMBIEN ) 5 MG tablet Take 1 - 2 tablets (5 - 10 mg total) by mouth at bedtime.   No facility-administered encounter medications on file as of 09/06/2023.    Allergies (verified) Benzonatate , Levofloxacin , Other, and Cephalosporins   History: Past Medical History:  Diagnosis Date   Arthritis    R knee- , not symptomatic but reports DJD   Baker's cyst    right leg    Bladder infection, chronic    post op  - knee replacement, has  had 3 rounds of antibiotics    BPH (benign prostatic hypertrophy)    Cancer (HCC)    basal cell carcinoma - abd   Chronic anticoagulation    Endocarditis    Family history of hiatal hernia    Headache    migraines   Heart murmur    Hemorrhoids    History of kidney stones 2018   2 stones- /w CAT scan, not yet passed    Hyperlipidemia    MVP (mitral valve prolapse)    Pneumonia 2017   post op after - R TKR   PONV (postoperative nausea and vomiting)    with dizziness    Proctitis    S/P MVR (mitral valve replacement)    Tinnitus    Urinary frequency    Weak urinary stream    Past Surgical History:  Procedure Laterality Date   APPENDECTOMY  1970   INGUINAL HERNIA REPAIR Right 10/26/2016   Procedure: RIGHT INGUINAL HERNIA REPAIR WITH MESH;  Surgeon: Lily Boas, MD;  Location: [facility];  Service: General;  Laterality: Right;  GENERAL AND LMA    INSERTION OF MESH Right 10/26/2016   Procedure: INSERTION OF MESH;  Surgeon: Lily Boas, MD;  Location: [facility] OR;  Service: General;  Laterality: Right;   KNEE SURGERY     MITRAL VALVE REPAIR     MITRAL VALVE REPLACEMENT     ST Jude 31MEC-102   right knee arthroscopy     times 2   TONSILLECTOMY     TOTAL KNEE ARTHROPLASTY Right 11/07/2015   Procedure: RIGHT TOTAL KNEE ARTHROPLASTY;  Surgeon: Dempsey Moan, MD;  Location: [facility];  Service: Orthopedics;  Laterality: Right;   Family History  Problem Relation Age of Onset   Hypertension Mother    Leukemia Mother    Social History   Socioeconomic History   Marital status: Married    Spouse name: Not on file   Number of children: Not on file   Years of education: Not on file   Highest education level: Professional school degree (e.g., MD, DDS, DVM, JD)  Occupational History   Not on file  Tobacco Use   Smoking status: Never   Smokeless tobacco: Never  Vaping Use   Vaping status: Never Used  Substance and Sexual Activity   Alcohol use: Yes     Comment: 1 drink 5 times per week    Drug use: No   Sexual activity: Yes  Other Topics Concern   Not on file  Social History Narrative   Not on file   Social Drivers of Health   Financial Resource Strain: Low Risk  (02/15/2023)   Overall Financial Resource Strain (CARDIA)    Difficulty of Paying Living Expenses:  Not very hard  Food Insecurity: No Food Insecurity (02/15/2023)   Hunger Vital Sign    Worried About Running Out of Food in the Last Year: Never true    Ran Out of Food in the Last Year: Never true  Transportation Needs: No Transportation Needs (02/15/2023)   PRAPARE - Administrator, Civil Service (Medical): No    Lack of Transportation (Non-Medical): No  Physical Activity: Insufficiently Active (02/15/2023)   Exercise Vital Sign    Days of Exercise per Week: 2 days    Minutes of Exercise per Session: 40 min  Stress: Stress Concern Present (02/15/2023)   [employer] of Occupational Health - Occupational Stress Questionnaire    Feeling of Stress : To some extent  Social Connections: Socially Isolated (02/15/2023)   Social Connection and Isolation Panel    Frequency of Communication with Friends and Family: Once a week    Frequency of Social Gatherings with Friends and Family: Once a week    Attends Religious Services: Never    Database administrator or Organizations: No    Attends Engineer, structural: Not on file    Marital Status: Married   Tobacco Counseling Counseling given: Not Answered  Activities of Daily Living     No data to display          Patient Care Team: Curtis Debby PARAS, MD as PCP - General (Family Medicine)  Indicate any recent Medical Services you may have received from other than [facility] providers in the past year (date may be approximate).     Assessment:   This is a routine wellness examination for Elfego.  Hearing/Vision screen No results found.   Goals Addressed   None    Depression Screen     09/06/2023    2:06 PM 08/31/2022    1:56 PM 09/01/2021    3:00 PM 09/01/2021    2:57 PM 07/29/2020    2:37 PM 06/20/2017    9:41 AM 10/19/2016   10:15 AM  PHQ 2/9 Scores  PHQ - 2 Score 0 0 0 0 0 0 1  PHQ- 9 Score      1     Fall Risk    09/06/2023    2:06 PM 08/31/2022    1:56 PM 09/01/2021    2:57 PM 07/29/2020    2:37 PM 06/24/2015   11:08 AM  Fall Risk   Falls in the past year? 0 0 0 0 No   Number falls in past yr: 0 0 0 0   Injury with Fall? 0 0 0 0   Risk for fall due to :    No Fall Risks   Follow up Falls evaluation completed Falls evaluation completed  Falls evaluation completed       Data saved with a previous flowsheet row definition    MEDICARE RISK AT HOME:    TIMED UP AND GO:  Was the test performed?  No    Cognitive Function: Adequate based on history, patient is an active chiropractor.    Immunizations Immunization History  Administered Date(s) Administered   Fluzone Influenza virus vaccine,trivalent (IIV3), split virus 01/03/2020   Influenza Split 09/14/2018, 10/16/2018   Influenza,inj,Quad PF,6+ Mos 12/20/2015, 02/01/2017, 12/06/2017, 10/16/2018   Influenza,inj,Quad PF,6-35 Mos 12/06/2017, 10/16/2018   Influenza,inj,quad, With Preservative 01/03/2020   Influenza-Unspecified 09/25/2017, 09/14/2018, 10/16/2018, 11/29/2020, 11/24/2021   Moderna Covid-19 Fall Seasonal Vaccine 87yrs & older 11/24/2021   Moderna Covid-19 Vaccine Bivalent Booster 79yrs & up  01/21/2021   Moderna Sars-Covid-2 Vaccination 03/04/2019, 04/01/2019, 11/10/2019   PNEUMOCOCCAL CONJUGATE-20 07/29/2020   Tdap 12/20/2015   Zoster Recombinant(Shingrix) 08/26/2018, 12/14/2018   Zoster, Unspecified 12/14/2018    TDAP status: Up to date  Flu Vaccine status: Up to date  Pneumococcal vaccine status: Up to date  Covid-19 vaccine status: Information provided on how to obtain vaccines.   Qualifies for Shingles Vaccine? Done.  Screening Tests Health Maintenance  Topic Date Due   COVID-19  Vaccine (6 - 2024-25 season) 10/21/2022   INFLUENZA VACCINE  09/20/2023   Medicare Annual Wellness (AWV)  09/05/2024   Fecal DNA (Cologuard)  09/08/2025   DTaP/Tdap/Td (2 - Td or Tdap) 12/19/2025   Pneumococcal Vaccine: 50+ Years  Completed   Hepatitis C Screening  Completed   Zoster Vaccines- Shingrix  Completed   Hepatitis B Vaccines  Aged Out   HPV VACCINES  Aged Out   Meningococcal B Vaccine  Aged Out    Health Maintenance  Health Maintenance Due  Topic Date Due   COVID-19 Vaccine (6 - 2024-25 season) 10/21/2022    Colorectal cancer screening: Type of screening: Cologuard. Completed 2024. Repeat every 3 years  Lung Cancer Screening: (Low Dose CT Chest recommended if Age 73-80 years, 20 pack-year currently smoking OR have quit w/in 15years.) does not qualify.   Vision Screening: Recommended annual ophthalmology exams for early detection of glaucoma and other disorders of the eye. Patient plans to get back in with his eye provider.  Dental Screening: Recommended annual dental exams for proper oral hygiene  General: Well Developed, well nourished, and in no acute distress.  Neuro: Alert and oriented x3, extra-ocular muscles intact, sensation grossly intact. Cranial nerves II through XII are intact, motor, sensory, and coordinative functions are all intact. HEENT: Normocephalic, atraumatic, pupils equal round reactive to light, neck supple, no masses, no lymphadenopathy, thyroid  nonpalpable. Oropharynx, nasopharynx, unremarkable, bilateral cerumen impactions. Skin: Warm and dry, no rashes noted.  Cardiac: Regular rate and rhythm, no rubs or gallops, artificial heart valve. Respiratory: Clear to auscultation bilaterally. Not using accessory muscles, speaking in full sentences.  Abdominal: Soft, nontender, nondistended, positive bowel sounds, no masses, no organomegaly.  Musculoskeletal: Shoulder, elbow, wrist, hip, knee, ankle stable, and with full range of motion.  Indication:  Cerumen impaction of the left and right ear(s) Medical necessity statement: On physical examination, cerumen impairs clinically significant portions of the external auditory canal, and tympanic membrane. Noted obstructive, copious cerumen that cannot be removed without magnification and instrumentations requiring physician skills Consent: Discussed benefits and risks of procedure and verbal consent obtained Procedure: Patient was prepped for the procedure. Utilized an otoscope to assess and take note of the ear canal, the tympanic membrane, and the presence, amount, and placement of the cerumen. Gentle water irrigation was utilized to remove cerumen.  Post procedure examination: shows cerumen was completely removed. Patient tolerated procedure well. The patient is made aware that they may experience temporary vertigo, temporary hearing loss, and temporary discomfort. If these symptom last for more than 24 hours to call the clinic or proceed to the ED.   Plan:    Bilateral supraspinatus tears Postop the right, doing okay. Left side still with a bit of pain but not enough to consider an operation. He is considering PRP, we can do this in the future if he desires.  Annual physical exam Medicare physical as above, up-to-date on screening measures, labs look great, return to see me in 1 year.  I have personally reviewed  and noted the following in the patient's chart:   Medical and social history Use of alcohol, tobacco or illicit drugs  Current medications and supplements including opioid prescriptions. Patient is not currently taking opioid prescriptions. Functional ability and status Nutritional status Physical activity Advanced directives List of other physicians Hospitalizations, surgeries, and ER visits in previous 12 months Vitals Screenings to include cognitive, depression, and falls Referrals and appointments  In addition, I have reviewed and discussed with patient certain  preventive protocols, quality metrics, and best practice recommendations. A written personalized care plan for preventive services as well as general preventive health recommendations were provided to patient.    ___________________________________________ Debby PARAS. Curtis, M.D., ABFM., CAQSM., AME. Primary Care and Sports Medicine  [facility]  Adjunct Instructor of Family Medicine  [employer]  Restaurant manager, fast food

## 2023-10-07 DIAGNOSIS — Z7901 Long term (current) use of anticoagulants: Secondary | ICD-10-CM | POA: Diagnosis not present

## 2023-10-07 DIAGNOSIS — Z952 Presence of prosthetic heart valve: Secondary | ICD-10-CM | POA: Diagnosis not present

## 2023-10-14 ENCOUNTER — Encounter: Payer: Self-pay | Admitting: Sports Medicine

## 2023-10-14 ENCOUNTER — Other Ambulatory Visit (HOSPITAL_COMMUNITY): Payer: Self-pay

## 2023-10-14 ENCOUNTER — Other Ambulatory Visit: Payer: Self-pay

## 2023-10-14 DIAGNOSIS — M75100 Unspecified rotator cuff tear or rupture of unspecified shoulder, not specified as traumatic: Secondary | ICD-10-CM

## 2023-10-14 MED ORDER — KETOPROFEN 10 % EX CREA
1.0000 | TOPICAL_CREAM | Freq: Two times a day (BID) | CUTANEOUS | 2 refills | Status: DC | PRN
  Filled 2023-10-14: qty 30, fill #0
  Filled 2023-10-15: qty 30, 30d supply, fill #0

## 2023-10-14 NOTE — Telephone Encounter (Signed)
 Ketoprofen  cream sent. Schedule with Dr. Edmundo in [place]  Meds ordered this encounter  Medications   Ketoprofen  10 % CREA    Sig: Apply 1 Application topically 2 (two) times daily as needed.    Dispense:  30 g    Refill:  2

## 2023-10-15 ENCOUNTER — Other Ambulatory Visit (HOSPITAL_COMMUNITY): Payer: Self-pay

## 2023-10-15 ENCOUNTER — Other Ambulatory Visit: Payer: Self-pay

## 2023-10-15 ENCOUNTER — Telehealth: Payer: Self-pay

## 2023-10-15 NOTE — Telephone Encounter (Signed)
 Copied from CRM [id number]. Topic: Clinical - Medication Question >> Oct 15, 2023  9:39 AM Zane F wrote: Reason for CRM:   Caller: Daphne  Calling From:   Medication In Question: Ketoprofen  10 % CREAM  The prescribing provider placed an order for the above prescription and unfortunately that is not a prescription they carry; They would need to know what the provider is attempting to treat to provide alternatives. Feel free to contact the pharmacy with follow up questions. Possible alternatives could be Diclofenac  gel.   [place] [facility] [address], [place] [address] Phone: [phone]  Fax: [phone] DEA #: [id number]

## 2023-10-15 NOTE — Telephone Encounter (Signed)
 Okay, please let patient know that they do not have any ketoprofen  products but that they do have diclofenac  gel if he is okay with that I am happy to send.

## 2023-10-15 NOTE — Telephone Encounter (Signed)
 Spoke with pharmacy. Asked if had Ketoprofen  gel instead, They do not have any ketoprofen  products available to them. Please see message attached from pharmacy regarding this. Would need to know what treating to help suggest alternatives.. but states the diclofenac  gel might be an option.

## 2023-10-16 NOTE — Telephone Encounter (Signed)
 Attempted call to patient. Left a voice mail message requesting a return call.

## 2023-10-17 MED ORDER — DICLOFENAC SODIUM 2 % EX SOLN
2.0000 | Freq: Two times a day (BID) | CUTANEOUS | 5 refills | Status: AC | PRN
  Filled 2023-10-17 – 2023-10-19 (×2): qty 112, 30d supply, fill #0

## 2023-10-17 NOTE — Telephone Encounter (Signed)
 Meds ordered this encounter  Medications   diclofenac  Sodium (PENNSAID ) 2 % SOLN    Sig: Apply 2 Pump (40 mg total) topically 2 (two) times daily as needed.    Dispense:  112 g    Refill:  5

## 2023-10-18 ENCOUNTER — Other Ambulatory Visit (HOSPITAL_COMMUNITY): Payer: Self-pay

## 2023-10-19 ENCOUNTER — Other Ambulatory Visit (HOSPITAL_COMMUNITY): Payer: Self-pay

## 2023-10-22 ENCOUNTER — Other Ambulatory Visit (HOSPITAL_COMMUNITY): Payer: Self-pay

## 2023-10-22 ENCOUNTER — Encounter: Payer: Self-pay | Admitting: Sports Medicine

## 2023-10-22 MED ORDER — ATORVASTATIN CALCIUM 20 MG PO TABS
20.0000 mg | ORAL_TABLET | Freq: Every day | ORAL | 0 refills | Status: DC
  Filled 2023-10-22 – 2023-11-05 (×2): qty 30, 30d supply, fill #0

## 2023-11-04 ENCOUNTER — Other Ambulatory Visit (HOSPITAL_COMMUNITY): Payer: Self-pay

## 2023-11-05 ENCOUNTER — Other Ambulatory Visit (HOSPITAL_COMMUNITY): Payer: Self-pay

## 2023-11-29 ENCOUNTER — Other Ambulatory Visit (HOSPITAL_COMMUNITY): Payer: Self-pay

## 2023-11-29 MED ORDER — ATORVASTATIN CALCIUM 20 MG PO TABS
20.0000 mg | ORAL_TABLET | Freq: Every day | ORAL | 3 refills | Status: DC
  Filled 2023-11-29: qty 90, 90d supply, fill #0

## 2023-12-02 ENCOUNTER — Other Ambulatory Visit: Payer: Self-pay

## 2023-12-02 DIAGNOSIS — Z7901 Long term (current) use of anticoagulants: Secondary | ICD-10-CM | POA: Diagnosis not present

## 2023-12-02 DIAGNOSIS — Z952 Presence of prosthetic heart valve: Secondary | ICD-10-CM | POA: Diagnosis not present

## 2023-12-03 ENCOUNTER — Other Ambulatory Visit (HOSPITAL_COMMUNITY): Payer: Self-pay

## 2023-12-19 ENCOUNTER — Other Ambulatory Visit: Payer: Self-pay | Admitting: Medical Genetics

## 2023-12-19 DIAGNOSIS — Z006 Encounter for examination for normal comparison and control in clinical research program: Secondary | ICD-10-CM

## 2023-12-20 ENCOUNTER — Ambulatory Visit (INDEPENDENT_AMBULATORY_CARE_PROVIDER_SITE_OTHER): Admitting: Urgent Care

## 2023-12-20 ENCOUNTER — Encounter: Payer: Self-pay | Admitting: Urgent Care

## 2023-12-20 VITALS — BP 125/60 | HR 73 | Ht 70.0 in | Wt 182.0 lb

## 2023-12-20 DIAGNOSIS — D692 Other nonthrombocytopenic purpura: Secondary | ICD-10-CM

## 2023-12-20 DIAGNOSIS — Z85828 Personal history of other malignant neoplasm of skin: Secondary | ICD-10-CM | POA: Diagnosis not present

## 2023-12-20 NOTE — Progress Notes (Signed)
 Established Patient Office Visit  Subjective:  Patient ID: Zachary Calderon, male    DOB: [birth year]  Age: 68 y.o. MRN: [id number]  Chief Complaint  Patient presents with   Rash    Spot on mid right forearm for 2 weeks    HPI  Discussed the use of AI scribe software for clinical note transcription with the patient, who gave verbal consent to proceed.  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  A couple of weeks ago, he noticed a red spot on his arm, which was pointed out by his son. He describes having multiple spots on his arm, but this particular one was more red than others. The spot is not tender and has never burned. Initially, there was some redness at the end of it, which he thought might be a scratch. He takes Coumadin , which causes him to bleed easily, and he noted that scratches sometimes ooze.  He also had an annual physical on July 18th, during which his INR was 3.4. He performs home INR testing every other Monday. His last complete blood count (CBC) was in July, and he has a history of fluctuating platelet counts, which have been low since his twenties. However, three months ago, his platelet count was normal. He does have a hx of BCC.      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   External hemorrhoid 01/20/2016   History of arthroplasty of right knee 11/07/2015   Migraine with aura 07/22/2015   BPH (benign prostatic hyperplasia) 06/24/2015   Annual physical exam 06/24/2015   S/P MVR (mitral valve replacement)    Endocarditis    Hyperlipidemia    Chronic anticoagulation    Past Medical History:  Diagnosis Date   Allergy 11/1998   Arthritis    R knee- , not  symptomatic but reports DJD   Baker's cyst    right leg    Bladder infection, chronic    post op - knee replacement, has  had 3 rounds of antibiotics    BPH (benign prostatic hypertrophy)    Cancer (HCC)    basal cell carcinoma - abd   Chronic anticoagulation    Clotting disorder 2000   Due to daily Coumadin  therapy as result of valve replacement   Endocarditis    Family history of hiatal hernia    Headache    migraines   Heart murmur    Hemorrhoids    History of kidney stones 2018   2 stones- /w CAT scan, not yet passed    Hyperlipidemia    MVP (mitral valve prolapse)    Pneumonia 2017   post op after - R TKR   PONV (postoperative nausea and vomiting)    with dizziness    Proctitis    S/P MVR (mitral valve replacement)    Tinnitus    Urinary frequency    Weak urinary stream    Past Surgical History:  Procedure Laterality Date   APPENDECTOMY  1970   CARDIAC VALVE REPLACEMENT  11/29/1998   HERNIA REPAIR  09/2016   INGUINAL HERNIA REPAIR Right 10/26/2016   Procedure: RIGHT INGUINAL HERNIA REPAIR WITH MESH;  Surgeon: Lily Boas,  MD;  Location: [facility];  Service: General;  Laterality: Right;  GENERAL AND LMA    INSERTION OF MESH Right 10/26/2016   Procedure: INSERTION OF MESH;  Surgeon: Lily Boas, MD;  Location: [facility] OR;  Service: General;  Laterality: Right;   JOINT REPLACEMENT  11/07/2015   KNEE SURGERY     MITRAL VALVE REPAIR     MITRAL VALVE REPLACEMENT     ST Jude 31MEC-102   right knee arthroscopy     times 2   TONSILLECTOMY     TOTAL KNEE ARTHROPLASTY Right 11/07/2015   Procedure: RIGHT TOTAL KNEE ARTHROPLASTY;  Surgeon: Dempsey Moan, MD;  Location: [facility];  Service: Orthopedics;  Laterality: Right;   Social History   Tobacco Use   Smoking status: Never   Smokeless tobacco: Never  Vaping Use   Vaping status: Never Used  Substance Use Topics   Alcohol use: Yes    Comment: 1 drink 5 times per week    Drug use: No      ROS: as noted in  HPI  Objective:     BP 125/60   Pulse 73   Ht 5' 10 (1.778 m)   Wt 182 lb (82.6 kg)   SpO2 97%   BMI 26.11 kg/m  BP Readings from Last 3 Encounters:  12/20/23 125/60  09/06/23 106/64  02/15/23 (!) 145/88   Wt Readings from Last 3 Encounters:  12/20/23 182 lb (82.6 kg)  09/06/23 175 lb (79.4 kg)  02/15/23 183 lb 2.1 oz (83.1 kg)      Physical Exam Vitals and nursing note reviewed.  Constitutional:      General: He is not in acute distress.    Appearance: Normal appearance. He is not ill-appearing, toxic-appearing or diaphoretic.  HENT:     Head: Normocephalic and atraumatic.     Nose: Nose normal.  Eyes:     General: No scleral icterus.    Pupils: Pupils are equal, round, and reactive to light.  Cardiovascular:     Rate and Rhythm: Normal rate.  Pulmonary:     Effort: Pulmonary effort is normal. No respiratory distress.  Skin:    General: Skin is warm and dry.     Findings: Lesion (single petechial/ purpuric lesion to R dorsal forearm as seen in photo) present. No erythema or rash.     Comments: Area does not blanch  Neurological:     General: No focal deficit present.     Mental Status: He is alert and oriented to person, place, and time.  Psychiatric:        Mood and Affect: Mood normal.        Behavior: Behavior normal.      No results found for any visits on 12/20/23.  Last CBC Lab Results  Component Value Date   WBC 4.3 09/05/2023   HGB 17.1 09/05/2023   HCT 51.0 09/05/2023   MCV 95 09/05/2023   MCH 31.8 09/05/2023   RDW 12.8 09/05/2023   PLT 168 09/05/2023   Last metabolic panel Lab Results  Component Value Date   GLUCOSE 82 09/05/2023   NA 138 09/05/2023   K 4.6 09/05/2023   CL 101 09/05/2023   CO2 24 09/05/2023   BUN 19 09/05/2023   CREATININE 0.95 09/05/2023   EGFR 87 09/05/2023   CALCIUM  9.3 09/05/2023   PROT 6.3 09/05/2023   ALBUMIN 4.6 09/05/2023   LABGLOB 1.7 09/05/2023   BILITOT 1.3 (H) 09/05/2023   ALKPHOS 65 09/05/2023  AST 37 09/05/2023   ALT 28 09/05/2023   ANIONGAP 7 11/27/2020      The 10-year ASCVD risk score (Arnett DK, et al., 2019) is: 13.5%  Assessment & Plan:  Purpura -     Ambulatory referral to Dermatology  History of basal cell carcinoma -     Ambulatory referral to Dermatology  Assessment and Plan    Purpura of skin due to anticoagulation Purpura on arm likely from Coumadin . Non-tender, non-burning. Subdermal bleeding common with anticoagulants. Actinic keratosis ruled out. - reassurance provided. Last INR and platelet levels were within goal range.  Thrombocytopenia Fluctuating platelet counts, lowest at 126,000. Recent CBC showed acceptable range.   Hx BCC Pt requesting derm referral despite reassuring lesion on arm due to known hx of BCC - referral placed to derm        No follow-ups on file.   Benton LITTIE Gave, PA

## 2023-12-20 NOTE — Patient Instructions (Addendum)
 Your arm spot is called purura. This is a purple spot on the skin caused by internal bleeding from small blood vessels. This is common in patients on blood thinning medication.  I have placed a dermatology referral for a full body skin check. Please follow up with specialist, or return if any acute changes to your current skin lesion.

## 2023-12-21 ENCOUNTER — Encounter: Payer: Self-pay | Admitting: Urgent Care

## 2023-12-26 ENCOUNTER — Other Ambulatory Visit (HOSPITAL_COMMUNITY): Payer: Self-pay

## 2023-12-26 DIAGNOSIS — Z952 Presence of prosthetic heart valve: Secondary | ICD-10-CM | POA: Diagnosis not present

## 2023-12-26 DIAGNOSIS — Z7901 Long term (current) use of anticoagulants: Secondary | ICD-10-CM | POA: Diagnosis not present

## 2023-12-26 DIAGNOSIS — Z9889 Other specified postprocedural states: Secondary | ICD-10-CM | POA: Diagnosis not present

## 2023-12-26 DIAGNOSIS — I483 Typical atrial flutter: Secondary | ICD-10-CM | POA: Diagnosis not present

## 2023-12-26 DIAGNOSIS — Z8679 Personal history of other diseases of the circulatory system: Secondary | ICD-10-CM | POA: Diagnosis not present

## 2023-12-26 MED ORDER — ATORVASTATIN CALCIUM 40 MG PO TABS
40.0000 mg | ORAL_TABLET | Freq: Every day | ORAL | 3 refills | Status: DC
  Filled 2023-12-26: qty 90, 90d supply, fill #0

## 2024-01-06 ENCOUNTER — Other Ambulatory Visit (HOSPITAL_COMMUNITY): Payer: Self-pay

## 2024-01-06 MED ORDER — ATORVASTATIN CALCIUM 10 MG PO TABS
ORAL_TABLET | ORAL | 3 refills | Status: AC
  Filled 2024-01-06: qty 270, 90d supply, fill #0

## 2024-01-21 LAB — GENECONNECT MOLECULAR SCREEN: Genetic Analysis Overall Interpretation: NEGATIVE

## 2024-01-24 DIAGNOSIS — E782 Mixed hyperlipidemia: Secondary | ICD-10-CM | POA: Diagnosis not present

## 2024-01-24 DIAGNOSIS — G43111 Migraine with aura, intractable, with status migrainosus: Secondary | ICD-10-CM | POA: Diagnosis not present

## 2024-01-24 DIAGNOSIS — Z7901 Long term (current) use of anticoagulants: Secondary | ICD-10-CM | POA: Diagnosis not present

## 2024-01-24 DIAGNOSIS — Z952 Presence of prosthetic heart valve: Secondary | ICD-10-CM | POA: Diagnosis not present

## 2024-01-24 DIAGNOSIS — I4510 Unspecified right bundle-branch block: Secondary | ICD-10-CM | POA: Diagnosis not present

## 2024-01-24 DIAGNOSIS — I483 Typical atrial flutter: Secondary | ICD-10-CM | POA: Diagnosis not present

## 2024-01-24 DIAGNOSIS — Z8679 Personal history of other diseases of the circulatory system: Secondary | ICD-10-CM | POA: Diagnosis not present

## 2024-01-24 DIAGNOSIS — Z9889 Other specified postprocedural states: Secondary | ICD-10-CM | POA: Diagnosis not present

## 2024-01-27 DIAGNOSIS — Z7901 Long term (current) use of anticoagulants: Secondary | ICD-10-CM | POA: Diagnosis not present

## 2024-01-27 DIAGNOSIS — Z952 Presence of prosthetic heart valve: Secondary | ICD-10-CM | POA: Diagnosis not present

## 2024-02-14 ENCOUNTER — Other Ambulatory Visit (HOSPITAL_COMMUNITY): Payer: Self-pay

## 2024-02-14 ENCOUNTER — Other Ambulatory Visit: Payer: Self-pay

## 2024-02-22 ENCOUNTER — Other Ambulatory Visit (HOSPITAL_COMMUNITY): Payer: Self-pay

## 2024-02-25 ENCOUNTER — Other Ambulatory Visit: Payer: Self-pay

## 2024-02-25 ENCOUNTER — Other Ambulatory Visit (HOSPITAL_COMMUNITY): Payer: Self-pay

## 2024-02-25 MED ORDER — ATORVASTATIN CALCIUM 20 MG PO TABS
20.0000 mg | ORAL_TABLET | Freq: Every day | ORAL | 3 refills | Status: AC
  Filled 2024-02-25: qty 90, 90d supply, fill #0

## 2024-02-25 MED ORDER — ATORVASTATIN CALCIUM 10 MG PO TABS
10.0000 mg | ORAL_TABLET | Freq: Every day | ORAL | 3 refills | Status: AC
  Filled 2024-02-25 (×2): qty 90, 90d supply, fill #0

## 2024-02-27 ENCOUNTER — Other Ambulatory Visit (HOSPITAL_COMMUNITY): Payer: Self-pay

## 2024-03-12 ENCOUNTER — Ambulatory Visit: Admitting: Dermatology

## 2024-03-12 ENCOUNTER — Encounter: Payer: Self-pay | Admitting: Dermatology

## 2024-03-12 DIAGNOSIS — L578 Other skin changes due to chronic exposure to nonionizing radiation: Secondary | ICD-10-CM | POA: Diagnosis not present

## 2024-03-12 DIAGNOSIS — L570 Actinic keratosis: Secondary | ICD-10-CM

## 2024-03-12 DIAGNOSIS — D1801 Hemangioma of skin and subcutaneous tissue: Secondary | ICD-10-CM

## 2024-03-12 DIAGNOSIS — L821 Other seborrheic keratosis: Secondary | ICD-10-CM

## 2024-03-12 DIAGNOSIS — D485 Neoplasm of uncertain behavior of skin: Secondary | ICD-10-CM

## 2024-03-12 DIAGNOSIS — Z1283 Encounter for screening for malignant neoplasm of skin: Secondary | ICD-10-CM

## 2024-03-12 DIAGNOSIS — W908XXA Exposure to other nonionizing radiation, initial encounter: Secondary | ICD-10-CM | POA: Diagnosis not present

## 2024-03-12 DIAGNOSIS — D489 Neoplasm of uncertain behavior, unspecified: Secondary | ICD-10-CM

## 2024-03-12 DIAGNOSIS — Z952 Presence of prosthetic heart valve: Secondary | ICD-10-CM

## 2024-03-12 DIAGNOSIS — L814 Other melanin hyperpigmentation: Secondary | ICD-10-CM | POA: Diagnosis not present

## 2024-03-12 DIAGNOSIS — D229 Melanocytic nevi, unspecified: Secondary | ICD-10-CM

## 2024-03-12 NOTE — Patient Instructions (Signed)

## 2024-03-12 NOTE — Progress Notes (Signed)
 "  New Patient Visit    History of Present Illness He is a 69 year old male who presents for a full body skin check.  He has a history of at least two basal cell carcinomas, with one removed from his abdomen in the late 1990s. He is unsure about the location of the second one, identified since 2016. He is concerned about an 'H spot' that has been present for a few years and is getting darker. A spot on his forearm, which he previously brought to attention, is present. He has significant sun exposure history, particularly during the 1980s without sunscreen, but has been diligent with sunscreen use over the past 10-15 years. He regularly wears baseball hats to protect his scalp.  He mentions having toe fungus on the left foot and the second toe on the right foot, treated with over-the-counter creams and oil. A white discoloration on his big toe began developing around April or May of the previous year. He recalls having a plantar wart as a teenager.  In terms of physical activity, he cycles in decent weather and goes to the [facility] in non-decent weather. He does not currently use sunscreen while cycling. He has had two cysts removed from his back and is generally not bothered by the spots on his skin, except for the 'H spot' and the forearm lesion.   Patient present today for new patient visit for TBSE.  Patient has previously been treated by a dermatologist when he was a teenager for acne. He took acutane fore about 6 months.  Patient reports that he has toe nail fungus. He would prefer to not take anything orally. He has tried tea tree oil and soaking his feet in Listerine. He has also tried hydrogen peroxide.   Patient's father has history of squamous cell carcinoma history of skin cancers. Patient has a personal history of BCC on his abdomen that was treated in the late 1990s by his PCP that treated with a biopsy and then another in 2016, but he is not sure of the location.  Patient reports  throughout her lifetime has had moderate sun exposure. Currently, patient reports if he  has excessive sun exposure. He does not wear sunscreen daily, but wears 50 SPF when he is at the beach.   The following portions of the chart were reviewed this encounter and updated as appropriate: medications, allergies, medical history  Review of Systems:  No other skin or systemic complaints except as noted in HPI or Assessment and Plan.  Objective  Well appearing patient in no apparent distress; mood and affect are within normal limits.  A full examination was performed including scalp, head, eyes, ears, nose, lips, neck, chest, axillae, abdomen, back, buttocks, bilateral upper extremities, bilateral lower extremities, hands, feet, fingers, toes, fingernails, and toenails. All findings within normal limits unless otherwise noted below.   Relevant exam findings are noted in the Assessment and Plan.  Left Temple 2.2 cm brown plaque  Right Forearm - Anterior Erythematous thin papules/macules with gritty scale.   Assessment & Plan    SKIN CANCER SCREENING PERFORMED TODAY   LENTIGINES, SEBORRHEIC KERATOSES, HEMANGIOMAS - Benign normal skin lesions - Benign-appearing - Call for any changes  MELANOCYTIC NEVI - Tan-brown and/or pink-flesh-colored symmetric macules and papules - Benign appearing on exam today - Observation - Call clinic for new or changing moles - Recommend daily use of broad spectrum spf 30+ sunscreen to sun-exposed areas.   ACTINIC DAMAGE - Chronic condition, secondary to  cumulative UV/sun exposure - diffuse scaly erythematous macules with underlying dyspigmentation - Recommend daily broad spectrum sunscreen SPF 30+ to sun-exposed areas, reapply every 2 hours as needed.  - Staying in the shade or wearing long sleeves, sun glasses (UVA+UVB protection) and wide brim hats (4-inch brim around the entire circumference of the hat) are also recommended for sun protection.  - Call  for new or changing lesions. NEOPLASM OF UNCERTAIN BEHAVIOR Left Temple - Skin / nail biopsy Type of biopsy: tangential   Informed consent: discussed and consent obtained   Timeout: patient name, date of birth, surgical site, and procedure verified   Procedure prep:  Patient was prepped and draped in usual sterile fashion Prep type:  Isopropyl alcohol Anesthesia: the lesion was anesthetized in a standard fashion   Anesthetic:  1% lidocaine  w/ epinephrine  1-100,000 buffered w/ 8.4% NaHCO3 Instrument used: flexible razor blade   Hemostasis achieved with: pressure, aluminum chloride and electrodesiccation   Outcome: patient tolerated procedure well   Post-procedure details: sterile dressing applied and wound care instructions given   Dressing type: bandage and petrolatum    Specimen 1 - Surgical pathology Differential Diagnosis: MM vs DN vs SK vs  other  Check Margins: No AK (ACTINIC KERATOSIS) Right Forearm - Anterior - Destruction of lesion - Right Forearm - Anterior Complexity: simple   Destruction method: cryotherapy   Informed consent: discussed and consent obtained   Timeout:  patient name, date of birth, surgical site, and procedure verified Lesion destroyed using liquid nitrogen: Yes   Region frozen until ice ball extended beyond lesion: Yes   Outcome: patient tolerated procedure well with no complications   Post-procedure details: wound care instructions given     No follow-ups on file.   Documentation: I have reviewed the above documentation for accuracy and completeness, and I agree with the above.  Zachary CHRISTELLA HOLY, MD   "

## 2024-03-17 LAB — SURGICAL PATHOLOGY

## 2024-03-18 ENCOUNTER — Ambulatory Visit: Payer: Self-pay | Admitting: Dermatology

## 2024-03-19 NOTE — Telephone Encounter (Signed)
 L/m for pt to call for bx results

## 2024-03-19 NOTE — Telephone Encounter (Signed)
-----   Message from Rufus Holy, MD sent at 03/18/2024 11:02 PM EST ----- This is an atypical lentigo (sun freckle), [facility] recommends further biopsy, so he should come in for shave removal

## 2024-03-19 NOTE — Progress Notes (Signed)
 Spoke with pt gave him bx results and treatment recommendations

## 2024-04-13 ENCOUNTER — Ambulatory Visit: Admitting: Dermatology

## 2024-04-22 ENCOUNTER — Encounter: Admitting: Family Medicine

## 2024-09-04 ENCOUNTER — Encounter: Admitting: Sports Medicine

## 2024-09-04 ENCOUNTER — Encounter: Admitting: Urgent Care

## 2024-09-08 ENCOUNTER — Encounter: Admitting: Family Medicine
